# Patient Record
Sex: Female | Born: 1960 | Race: White | Hispanic: No | Marital: Married | Smoking: Never smoker
Health system: Southern US, Community
[De-identification: ages and names within clinical notes are randomized; demographics above are authoritative.]

## PROBLEM LIST (undated history)

## (undated) DIAGNOSIS — M109 Gout, unspecified: Secondary | ICD-10-CM

## (undated) DIAGNOSIS — D649 Anemia, unspecified: Secondary | ICD-10-CM

## (undated) DIAGNOSIS — C8590 Non-Hodgkin lymphoma, unspecified, unspecified site: Secondary | ICD-10-CM

## (undated) DIAGNOSIS — K760 Fatty (change of) liver, not elsewhere classified: Secondary | ICD-10-CM

## (undated) DIAGNOSIS — K219 Gastro-esophageal reflux disease without esophagitis: Secondary | ICD-10-CM

## (undated) DIAGNOSIS — I10 Essential (primary) hypertension: Secondary | ICD-10-CM

## (undated) DIAGNOSIS — G43909 Migraine, unspecified, not intractable, without status migrainosus: Secondary | ICD-10-CM

## (undated) DIAGNOSIS — E785 Hyperlipidemia, unspecified: Secondary | ICD-10-CM

## (undated) DIAGNOSIS — J189 Pneumonia, unspecified organism: Secondary | ICD-10-CM

## (undated) DIAGNOSIS — E119 Type 2 diabetes mellitus without complications: Secondary | ICD-10-CM

## (undated) DIAGNOSIS — F419 Anxiety disorder, unspecified: Secondary | ICD-10-CM

## (undated) HISTORY — DX: Anxiety disorder, unspecified: F41.9

## (undated) HISTORY — DX: Pneumonia, unspecified organism: J18.9

## (undated) HISTORY — DX: Hyperlipidemia, unspecified: E78.5

## (undated) HISTORY — DX: Anemia, unspecified: D64.9

## (undated) HISTORY — DX: Type 2 diabetes mellitus without complications: E11.9

## (undated) HISTORY — PX: TONSILLECTOMY: SUR1361

## (undated) HISTORY — DX: Gout, unspecified: M10.9

## (undated) HISTORY — PX: ABDOMINAL HYSTERECTOMY: SHX81

## (undated) HISTORY — DX: Migraine, unspecified, not intractable, without status migrainosus: G43.909

---

## 2020-04-06 ENCOUNTER — Emergency Department (HOSPITAL_COMMUNITY)
Admission: EM | Admit: 2020-04-06 | Discharge: 2020-04-06 | Disposition: A | Discharge status: Home / Self Care | Payer: 59 | Attending: Emergency Medicine | Admitting: Emergency Medicine

## 2020-04-06 ENCOUNTER — Other Ambulatory Visit: Payer: Self-pay

## 2020-04-06 ENCOUNTER — Encounter (HOSPITAL_COMMUNITY): Payer: Self-pay

## 2020-04-06 ENCOUNTER — Emergency Department (HOSPITAL_COMMUNITY): Payer: 59

## 2020-04-06 DIAGNOSIS — R079 Chest pain, unspecified: Secondary | ICD-10-CM | POA: Diagnosis not present

## 2020-04-06 DIAGNOSIS — R0602 Shortness of breath: Secondary | ICD-10-CM | POA: Diagnosis not present

## 2020-04-06 DIAGNOSIS — R110 Nausea: Secondary | ICD-10-CM | POA: Insufficient documentation

## 2020-04-06 DIAGNOSIS — I10 Essential (primary) hypertension: Secondary | ICD-10-CM | POA: Insufficient documentation

## 2020-04-06 DIAGNOSIS — Z91040 Latex allergy status: Secondary | ICD-10-CM | POA: Insufficient documentation

## 2020-04-06 HISTORY — DX: Fatty (change of) liver, not elsewhere classified: K76.0

## 2020-04-06 HISTORY — DX: Gastro-esophageal reflux disease without esophagitis: K21.9

## 2020-04-06 HISTORY — DX: Essential (primary) hypertension: I10

## 2020-04-06 LAB — CBC
HCT: 43.1 % (ref 36.0–46.0)
Hemoglobin: 13.7 g/dL (ref 12.0–15.0)
MCH: 27.7 pg (ref 26.0–34.0)
MCHC: 31.8 g/dL (ref 30.0–36.0)
MCV: 87.2 fL (ref 80.0–100.0)
Platelets: 269 10*3/uL (ref 150–400)
RBC: 4.94 MIL/uL (ref 3.87–5.11)
RDW: 13 % (ref 11.5–15.5)
WBC: 7.1 10*3/uL (ref 4.0–10.5)
nRBC: 0.3 % — ABNORMAL HIGH (ref 0.0–0.2)

## 2020-04-06 LAB — BASIC METABOLIC PANEL
Anion gap: 11 (ref 5–15)
BUN: 16 mg/dL (ref 6–20)
CO2: 22 mmol/L (ref 22–32)
Calcium: 9.5 mg/dL (ref 8.9–10.3)
Chloride: 108 mmol/L (ref 98–111)
Creatinine, Ser: 1.05 mg/dL — ABNORMAL HIGH (ref 0.44–1.00)
GFR calc Af Amer: >60 mL/min (ref >60)
GFR calc non Af Amer: 58 mL/min — ABNORMAL LOW (ref >60)
Glucose, Bld: 200 mg/dL — ABNORMAL HIGH (ref 70–99)
Potassium: 4.7 mmol/L (ref 3.5–5.1)
Sodium: 141 mmol/L (ref 135–145)

## 2020-04-06 LAB — D-DIMER, QUANTITATIVE: D-Dimer, Quant: <0.27 ug/mL-FEU (ref 0.00–0.50)

## 2020-04-06 LAB — TROPONIN I (HIGH SENSITIVITY)
Troponin I (High Sensitivity): 5 ng/L (ref <18)
Troponin I (High Sensitivity): 3 ng/L (ref <18)

## 2020-04-06 NOTE — ED Provider Notes (Signed)
[place] Provider Note   CSN: [id number] Arrival date & time: 04/06/20  0912     History Chief Complaint  Patient presents with  . Chest Pain  . Shortness of Breath    Debbie Hall is a 59 y.o. female with a past medical history significant for fatty liver, GERD, and hypertension who presents to the ED due to chest pain and shortness of breath.  Patient states she has been experiencing shortness of breath for the past year however it has worsened over the past few weeks.  Today she developed central, nonradiating chest pain that lasted roughly 1 hour.  She is currently chest pain-free.  She describes the chest pain as pressure-like.  Admits to having a family history of early CAD.  Admits to associated nausea, but denies vomiting.  Denies history of blood clots, recent surgeries, recent long immobilizations, and hormonal treatments.  Denies lower extremity edema.  Patient has been evaluated numerous times by her PCP for the shortness of breath who believes it is related to wearing a mask.  Patient states today she felt like her chest pain and shortness of breath were related to anxiety, so she took a Xanax with no improvement in symptoms.  Patient was evaluated in urgent care 2 weeks ago who advised her to go to the ED for further evaluation.  Patient states she went to the ED in [place] with all reassuring work-up.  Denies history of asthma.  Denies tobacco use.  Denies infectious symptoms of cough, fever, chills, and sore throat.  No treatment prior to arrival.  History obtained from patient and past medical records. No interpreter used during encounter.      Past Medical History:  Diagnosis Date  . Fatty liver   . GERD (gastroesophageal reflux disease)   . Hypertension     There are no problems to display for this patient.   Past Surgical History:  Procedure Laterality Date  . ABDOMINAL HYSTERECTOMY    . CESAREAN SECTION    .  TONSILLECTOMY       OB History   No obstetric history on file.     No family history on file.  Social History   Tobacco Use  . Smoking status: Not on file  Substance Use Topics  . Alcohol use: Not on file  . Drug use: Not on file    Home Medications Prior to Admission medications   Not on File    Allergies    Latex, Lisinopril, and Penicillins  Review of Systems   Review of Systems  Constitutional: Negative for chills and fever.  Respiratory: Positive for shortness of breath. Negative for cough.   Cardiovascular: Positive for chest pain. Negative for leg swelling.  Gastrointestinal: Positive for nausea. Negative for abdominal pain, diarrhea and vomiting.  All other systems reviewed and are negative.   Physical Exam Updated Vital Signs BP 131/85 (BP Location: Right Arm)   Pulse 77   Temp 98.2 F (36.8 C) (Oral)   Resp 17   Ht 5\' 5"  (1.651 m)   Wt 86.2 kg   SpO2 98%   BMI 31.62 kg/m   Physical Exam Vitals and nursing note reviewed.  Constitutional:      General: She is not in acute distress.    Appearance: She is not ill-appearing.  HENT:     Head: Normocephalic.  Eyes:     Pupils: Pupils are equal, round, and reactive to light.  Cardiovascular:  Rate and Rhythm: Normal rate and regular rhythm.     Pulses: Normal pulses.     Heart sounds: Normal heart sounds. No murmur heard.  No friction rub. No gallop.   Pulmonary:     Effort: Pulmonary effort is normal.     Breath sounds: Normal breath sounds.     Comments: Respirations equal and unlabored, patient able to speak in full sentences, lungs clear to auscultation bilaterally Abdominal:     General: Abdomen is flat. There is no distension.     Palpations: Abdomen is soft.     Tenderness: There is no abdominal tenderness. There is no guarding or rebound.  Musculoskeletal:     Cervical back: Neck supple.     Comments: No lower extremity edema.  Negative Homans' sign bilaterally.  No calf tenderness  bilaterally.  Skin:    General: Skin is warm and dry.  Neurological:     General: No focal deficit present.     Mental Status: She is alert.  Psychiatric:        Mood and Affect: Mood normal.        Behavior: Behavior normal.     ED Results / Procedures / Treatments   Labs (all labs ordered are listed, but only abnormal results are displayed) Labs Reviewed  BASIC METABOLIC PANEL - Abnormal; Notable for the following components:      Result Value   Glucose, Bld 200 (*)    Creatinine, Ser 1.05 (*)    GFR calc non Af Amer 58 (*)    All other components within normal limits  CBC - Abnormal; Notable for the following components:   nRBC 0.3 (*)    All other components within normal limits  D-DIMER, QUANTITATIVE (NOT AT [facility])  TROPONIN I (HIGH SENSITIVITY)  TROPONIN I (HIGH SENSITIVITY)    EKG None  Radiology DG Chest 2 View  Result Date: 04/06/2020 CLINICAL DATA:  Chest pain. EXAM: CHEST - 2 VIEW COMPARISON:  None. FINDINGS: The heart size and mediastinal contours are within normal limits. Both lungs are clear. No pneumothorax or pleural effusion is noted. The visualized skeletal structures are unremarkable. IMPRESSION: No active cardiopulmonary disease. Electronically Signed   By: Marijo Conception M.D.   On: 04/06/2020 10:15    Procedures Procedures (including critical care time)  Medications Ordered in ED Medications - No data to display  ED Course  I have reviewed the triage vital signs and the nursing notes.  Pertinent labs & imaging results that were available during my care of the patient were reviewed by me and considered in my medical decision making (see chart for details).  Clinical Course as of Apr 06 1618  Mon Apr 06, 2020  1614 D-Dimer, Quant: <0.27 [CA]    Clinical Course User Index [CA] Karie Kirks   MDM Rules/Calculators/A&P                         59 year old female presents to the ED due to chest pain and shortness of breath.   Shortness of breath has been ongoing for the past year however is worsened over the past few weeks.  Chest pain started today.  Denies history of blood clots, recent surgeries, recent long immobilizations, and hormonal treatments.  No history of asthma.  Upon arrival, patient is afebrile, not tachycardic or hypoxic.  Patient in no acute distress and non-ill-appearing.  Physical exam reassuring.  Lungs clear to auscultation bilaterally.  No  clinical signs of DVT on exam.  Routine labs and troponin ordered at triage.  Shared decision making in regards to obtaining a D-dimer and patient would like to move forward to assess for PE even though my suspicion is low.  Delta troponin flat.  Doubt ACS.  CBC reassuring with no leukocytosis and normal hemoglobin.  Doubt symptomatic anemia.  BMP reassuring with hyperglycemia at 200 with no anion gap.  Doubt DKA.  Mild elevation in creatinine at 1.05.  Chest x-ray personally reviewed which is negative for signs of pneumonia, pneumothorax, or widened mediastinum.  EKG personally reviewed which demonstrates normal sinus rhythm with no signs of acute ischemia.  D-dimer normal.  Doubt PE/DVT.  Presentation nonconcerning for dissection.  Advised patient to follow-up with PCP within the next week for further evaluation.  No hypoxia during patient's entire ED stay. Strict ED precautions discussed with patient. Patient states understanding and agrees to plan. Patient discharged home in no acute distress and stable vitals.  Final Clinical Impression(s) / ED Diagnoses Final diagnoses:  Nonspecific chest pain  Shortness of breath    Rx / DC Orders ED Discharge Orders    None       Karie Kirks 04/06/20 1620    Milton Ferguson, MD 04/07/20 [phone]

## 2020-04-06 NOTE — Discharge Instructions (Addendum)
As discussed, all your labs are reassuring today.  There are no signs of a blood clot on my exam or within your labs.  Please follow-up with your PCP within the next week for further evaluation.  Return to the ER for new or worsening symptoms.

## 2020-04-06 NOTE — ED Triage Notes (Signed)
Pt reports chest pain and SOB for the past few weeks. Pt seen at PCP and was told it was from wearing masks all the time. Pt states her pain got worse this morning while driving to work, thought it was related to her anxiety so she took a xanax but it did not help. Pt a.o

## 2020-09-07 ENCOUNTER — Encounter: Payer: Self-pay | Admitting: Gastroenterology

## 2020-09-07 ENCOUNTER — Ambulatory Visit (INDEPENDENT_AMBULATORY_CARE_PROVIDER_SITE_OTHER): Payer: PRIVATE HEALTH INSURANCE | Admitting: Gastroenterology

## 2020-09-07 ENCOUNTER — Other Ambulatory Visit (INDEPENDENT_AMBULATORY_CARE_PROVIDER_SITE_OTHER): Payer: PRIVATE HEALTH INSURANCE

## 2020-09-07 VITALS — BP 138/80 | HR 91 | Ht 65.0 in | Wt 193.0 lb

## 2020-09-07 DIAGNOSIS — R7989 Other specified abnormal findings of blood chemistry: Secondary | ICD-10-CM | POA: Diagnosis not present

## 2020-09-07 LAB — COMPREHENSIVE METABOLIC PANEL
ALT: 176 U/L — ABNORMAL HIGH (ref 0–35)
AST: 113 U/L — ABNORMAL HIGH (ref 0–37)
Albumin: 4.9 g/dL (ref 3.5–5.2)
Alkaline Phosphatase: 84 U/L (ref 39–117)
BUN: 18 mg/dL (ref 6–23)
CO2: 28 mEq/L (ref 19–32)
Calcium: 10.1 mg/dL (ref 8.4–10.5)
Chloride: 100 mEq/L (ref 96–112)
Creatinine, Ser: 0.77 mg/dL (ref 0.40–1.20)
GFR: 83.35 mL/min (ref >60.00)
Glucose, Bld: 122 mg/dL — ABNORMAL HIGH (ref 70–99)
Potassium: 3.6 mEq/L (ref 3.5–5.1)
Sodium: 139 mEq/L (ref 135–145)
Total Bilirubin: 0.7 mg/dL (ref 0.2–1.2)
Total Protein: 8.1 g/dL (ref 6.0–8.3)

## 2020-09-07 LAB — IBC + FERRITIN
Ferritin: 252.9 ng/mL (ref 10.0–291.0)
Iron: 99 ug/dL (ref 42–145)
Saturation Ratios: 25.6 % (ref 20.0–50.0)
Transferrin: 276 mg/dL (ref 212.0–360.0)

## 2020-09-07 LAB — CBC
HCT: 42.3 % (ref 36.0–46.0)
Hemoglobin: 14.2 g/dL (ref 12.0–15.0)
MCHC: 33.6 g/dL (ref 30.0–36.0)
MCV: 82.4 fl (ref 78.0–100.0)
Platelets: 277 10*3/uL (ref 150.0–400.0)
RBC: 5.13 Mil/uL — ABNORMAL HIGH (ref 3.87–5.11)
RDW: 13.3 % (ref 11.5–15.5)
WBC: 8 10*3/uL (ref 4.0–10.5)

## 2020-09-07 NOTE — Patient Instructions (Addendum)
If you are age 61 or younger, your body mass index should be between 19-25. Your Body mass index is 32.12 kg/m. If this is out of the aformentioned range listed, please consider follow up with your Primary Care Provider.   Your provider has requested that you go to the basement level for lab work before leaving today. Press "B" on the elevator. The lab is located at the first door on the left as you exit the elevator.  You have been scheduled for an abdominal ultrasound at [facility] (1st floor of hospital) on 09-21-20 at 9:00am. Please arrive 30 minutes prior to your appointment for registration. Make certain not to have anything to eat or drink after midnight the night prior to your appointment. Should you need to reschedule your appointment, please contact radiology at [phone]. This test typically takes about 30 minutes to perform.  Due to recent changes in healthcare laws, you may see the results of your imaging and laboratory studies on MyChart before your provider has had a chance to review them.  We understand that in some cases there may be results that are confusing or concerning to you. Not all laboratory results come back in the same time frame and the provider may be waiting for multiple results in order to interpret others.  Please give [place] 48 hours in order for your provider to thoroughly review all the results before contacting the office for clarification of your results.   Thank you for entrusting me with your care and choosing [facility].  Dr Ardis Hughs

## 2020-09-07 NOTE — Progress Notes (Signed)
HPI: This is a very pleasant 59 year old woman who was referred to me by Tempie Hoist, FNP  to evaluate elevated liver tests.    She has known about elevated liver tests for at least the past 4 years.  She says her transaminases have been as high as the 500s, they are usually in the 100s.  Most recently her primary care physician checked them.  See below.  She tells me she has seen 3 gastroenterologist in the past 4 years and has always been told that she has fatty liver and that there is nothing that can be done for this besides her losing weight.  She has had 4 ultrasounds 2 CAT scans and one upper endoscopy.  She has never had a liver biopsy.  At 1 point she was told that her ferritin was elevated.  She believes one of her brothers has hemochromatosis.  She has gained 20 pounds in the last year or so.  She has constant discomfort in her right upper quadrant which is intermittently worse especially when she eats certain fatty containing foods.   Old Data Reviewed: At the time of her visit today I was able to review an office note from [place] primary care team from July 02, 2020.  She was having some abdominal pain in her right upper quadrant.  There is a note in the HPI section which reads "she has been told she had fatty liver and is seen 3 gastroenterologist with no other findings.  She has not had an abdominal ultrasound or CT in a year since the hard area formed".  Blood work from that week in early October showed normal hemoglobin.  Complete metabolic profile was normal except for AST 310, ALT 346.     Review of systems: Pertinent positive and negative review of systems were noted in the above HPI section. All other review negative.   Past Medical History:  Diagnosis Date  . Anemia   . Anxiety   . Fatty liver   . GERD (gastroesophageal reflux disease)   . Gout   . Hyperlipidemia   . Hypertension   . Migraines   . Pneumonia    as a child    Past  Surgical History:  Procedure Laterality Date  . ABDOMINAL HYSTERECTOMY     partial  . CESAREAN SECTION     x 2  . TONSILLECTOMY      Current Outpatient Medications  Medication Sig Dispense Refill  . ALPRAZolam (XANAX) 0.5 MG tablet Take 0.5 mg by mouth 2 (two) times daily as needed.    . Biotin 10 MG TABS Take 1 tablet by mouth daily.    Marland Kitchen buPROPion (WELLBUTRIN XL) 150 MG 24 hr tablet  See Instructions, TAKE 1 TABLET BY MOUTH EVERY DAY, # 90 tab, 1 Refill(s), Pharmacy: [id number]    . hydrochlorothiazide (HYDRODIURIL) 25 MG tablet Take 12.5 mg by mouth daily.    . metoprolol tartrate (LOPRESSOR) 50 MG tablet Take 0.5 tablets by mouth in the morning and at bedtime.    . ondansetron (ZOFRAN) 4 MG tablet Take 4 mg by mouth every 8 (eight) hours as needed for nausea or vomiting.    . pantoprazole (PROTONIX) 40 MG tablet Take 40 mg by mouth daily.     No current facility-administered medications for this visit.    Allergies as of 09/07/2020 - Review Complete 09/07/2020  Allergen Reaction Noted  . Avocado Itching 05/15/2018  . Banana Itching 05/15/2018  .  Latex Itching 05/15/2018  . Lisinopril  04/06/2020  . Penicillin g  09/04/2020  . Penicillins  04/06/2020    Family History  Problem Relation Age of Onset  . Hypertension Mother   . Heart disease Mother   . Heart attack Mother   . Pancreatic cancer Mother   . Prostate cancer Father   . Colon cancer Neg Hx   . Esophageal cancer Neg Hx   . Rectal cancer Neg Hx     Social History   Socioeconomic History  . Marital status: Married    Spouse name: Not on file  . Number of children: 4  . Years of education: Not on file  . Highest education level: Not on file  Occupational History  . 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    Comment: [employer]  Tobacco Use  . Smoking status: Never Smoker  . Smokeless tobacco: Never Used  Vaping Use  . Vaping Use: Never used  Substance and Sexual Activity  . Alcohol  use: Yes    Comment: occasional  . Drug use: Never  . Sexual activity: Not on file  Other Topics Concern  . Not on file  Social History Narrative  . Not on file   Social Determinants of Health   Financial Resource Strain: Not on file  Food Insecurity: Not on file  Transportation Needs: Not on file  Physical Activity: Not on file  Stress: Not on file  Social Connections: Not on file  Intimate Partner Violence: Not on file     Physical Exam: BP 138/80   Pulse 91   Ht 5\' 5"  (1.651 m)   Wt 193 lb (87.5 kg)   BMI 32.12 kg/m  Constitutional: generally well-appearing Psychiatric: alert and oriented x3 Eyes: extraocular movements intact Mouth: oral pharynx moist, no lesions Neck: supple no lymphadenopathy Cardiovascular: heart regular rate and rhythm Lungs: clear to auscultation bilaterally Abdomen: soft, nontender, nondistended, no obvious ascites, no peritoneal signs, normal bowel sounds Extremities: no lower extremity edema bilaterally Skin: no lesions on visible extremities   Assessment and plan: 59 y.o. female with chronically elevated liver tests, family history of hemochromatosis  Her brother has hemochromatosis.  She was told that her ferritin was elevated at some point in the past 2 years.  She has never been told that she might also have iron overload or hemochromatosis.  I suspect that however may be the case.  I recommended we proceed with blood tests and imaging studies as usual work-up for elevated liver tests.  In addition I am ordering some hemochromatosis genetic testing given her family history.  She understands that she might need liver biopsy pending the results of all the above tests.   Please see the "Patient Instructions" section for addition details about the plan.   Owens Loffler, MD [facility] 09/07/2020, 1:41 PM  Cc: Tempie Hoist, FNP  Total time on date of encounter was 45 minutes (this included time spent preparing to see  the patient reviewing records; obtaining and/or reviewing separately obtained history; performing a medically appropriate exam and/or evaluation; counseling and educating the patient and family if present; ordering medications, tests or procedures if applicable; and documenting clinical information in the health record).

## 2020-09-07 NOTE — Addendum Note (Signed)
Addended by: Adah Salvage F on: 09/07/2020 02:39 PM   Modules accepted: Orders

## 2020-09-07 NOTE — Addendum Note (Signed)
Addended by: Adah Salvage F on: 09/07/2020 02:40 PM   Modules accepted: Orders

## 2020-09-10 LAB — PROTIME-INR
INR: 1 ratio (ref 0.8–1.0)
Prothrombin Time: 11.7 s (ref 9.6–13.1)

## 2020-09-11 LAB — ANTI-SMOOTH MUSCLE ANTIBODY, IGG: Actin (Smooth Muscle) Antibody (IGG): <20 U (ref <20)

## 2020-09-11 LAB — MITOCHONDRIAL ANTIBODIES: Mitochondrial M2 Ab, IgG: <=20 U

## 2020-09-11 LAB — HEPATITIS C ANTIBODY
Hepatitis C Ab: NONREACTIVE
SIGNAL TO CUT-OFF: 0.02 (ref <1.00)

## 2020-09-11 LAB — ANTI-NUCLEAR AB-TITER (ANA TITER): ANA Titer 1: 1:80 {titer} — ABNORMAL HIGH

## 2020-09-11 LAB — IRON, TOTAL/TOTAL IRON BINDING CAP
%SAT: 25 % (calc) (ref 16–45)
Iron: 94 ug/dL (ref 45–160)
TIBC: 379 mcg/dL (calc) (ref 250–450)

## 2020-09-11 LAB — IGA: Immunoglobulin A: 130 mg/dL (ref 70–320)

## 2020-09-11 LAB — CERULOPLASMIN: Ceruloplasmin: 34 mg/dL (ref 18–53)

## 2020-09-11 LAB — ANA: Anti Nuclear Antibody (ANA): POSITIVE — AB

## 2020-09-11 LAB — HEPATITIS B SURFACE ANTIGEN: Hepatitis B Surface Ag: NONREACTIVE

## 2020-09-11 LAB — ALPHA-1-ANTITRYPSIN: A-1 Antitrypsin, Ser: 158 mg/dL (ref 83–199)

## 2020-09-11 LAB — TISSUE TRANSGLUTAMINASE, IGA: (tTG) Ab, IgA: <1 U/mL

## 2020-09-11 LAB — HEPATITIS A ANTIBODY, TOTAL: Hepatitis A AB,Total: NONREACTIVE

## 2020-09-11 LAB — HEPATITIS B SURFACE ANTIBODY,QUALITATIVE: Hep B S Ab: REACTIVE — AB

## 2020-09-16 LAB — HEMOCHROMATOSIS DNA-PCR(C282Y,H63D)

## 2020-09-21 ENCOUNTER — Ambulatory Visit (HOSPITAL_COMMUNITY)
Admission: RE | Admit: 2020-09-21 | Discharge: 2020-09-21 | Disposition: A | Discharge status: Home / Self Care | Payer: PRIVATE HEALTH INSURANCE | Source: Ambulatory Visit | Attending: Gastroenterology | Admitting: Gastroenterology

## 2020-09-21 ENCOUNTER — Other Ambulatory Visit: Payer: Self-pay

## 2020-09-21 DIAGNOSIS — R7989 Other specified abnormal findings of blood chemistry: Secondary | ICD-10-CM | POA: Diagnosis not present

## 2020-09-22 ENCOUNTER — Other Ambulatory Visit: Payer: Self-pay

## 2020-09-22 DIAGNOSIS — R7989 Other specified abnormal findings of blood chemistry: Secondary | ICD-10-CM

## 2020-09-29 ENCOUNTER — Telehealth: Payer: Self-pay | Admitting: Gastroenterology

## 2020-09-29 NOTE — Telephone Encounter (Signed)
The pt has questions regarding the actual biopsy.  I have given her the number to [facility] ultrasound so that she can have her questions answered. The pt has been advised of the information and verbalized understanding.

## 2020-09-29 NOTE — Telephone Encounter (Signed)
Inbound call from patient requesting a call back in regards to biopsy she has scheduled on 10/05/20 please.

## 2020-10-05 ENCOUNTER — Ambulatory Visit (HOSPITAL_COMMUNITY): Admission: RE | Admit: 2020-10-05 | Payer: No Typology Code available for payment source | Source: Ambulatory Visit

## 2020-10-06 ENCOUNTER — Other Ambulatory Visit: Payer: Self-pay | Admitting: Student

## 2020-10-07 ENCOUNTER — Ambulatory Visit (HOSPITAL_COMMUNITY)
Admission: RE | Admit: 2020-10-07 | Discharge: 2020-10-07 | Disposition: A | Discharge status: Home / Self Care | Payer: PRIVATE HEALTH INSURANCE | Source: Ambulatory Visit | Attending: Gastroenterology | Admitting: Gastroenterology

## 2020-10-07 ENCOUNTER — Other Ambulatory Visit: Payer: Self-pay

## 2020-10-07 ENCOUNTER — Other Ambulatory Visit: Payer: Self-pay | Admitting: Physician Assistant

## 2020-10-07 DIAGNOSIS — R110 Nausea: Secondary | ICD-10-CM | POA: Insufficient documentation

## 2020-10-07 DIAGNOSIS — Z79899 Other long term (current) drug therapy: Secondary | ICD-10-CM | POA: Diagnosis not present

## 2020-10-07 DIAGNOSIS — E785 Hyperlipidemia, unspecified: Secondary | ICD-10-CM | POA: Insufficient documentation

## 2020-10-07 DIAGNOSIS — K219 Gastro-esophageal reflux disease without esophagitis: Secondary | ICD-10-CM | POA: Diagnosis not present

## 2020-10-07 DIAGNOSIS — F419 Anxiety disorder, unspecified: Secondary | ICD-10-CM | POA: Diagnosis not present

## 2020-10-07 DIAGNOSIS — R7989 Other specified abnormal findings of blood chemistry: Secondary | ICD-10-CM

## 2020-10-07 DIAGNOSIS — R1011 Right upper quadrant pain: Secondary | ICD-10-CM | POA: Diagnosis not present

## 2020-10-07 DIAGNOSIS — Z7901 Long term (current) use of anticoagulants: Secondary | ICD-10-CM | POA: Diagnosis not present

## 2020-10-07 DIAGNOSIS — K7581 Nonalcoholic steatohepatitis (NASH): Secondary | ICD-10-CM | POA: Diagnosis not present

## 2020-10-07 DIAGNOSIS — I10 Essential (primary) hypertension: Secondary | ICD-10-CM | POA: Diagnosis not present

## 2020-10-07 LAB — CBC
HCT: 43 % (ref 36.0–46.0)
Hemoglobin: 13.8 g/dL (ref 12.0–15.0)
MCH: 27.3 pg (ref 26.0–34.0)
MCHC: 32.1 g/dL (ref 30.0–36.0)
MCV: 85.1 fL (ref 80.0–100.0)
Platelets: 261 10*3/uL (ref 150–400)
RBC: 5.05 MIL/uL (ref 3.87–5.11)
RDW: 13.2 % (ref 11.5–15.5)
WBC: 6.6 10*3/uL (ref 4.0–10.5)
nRBC: 0 % (ref 0.0–0.2)

## 2020-10-07 LAB — PROTIME-INR
INR: 1 (ref 0.8–1.2)
Prothrombin Time: 12.7 seconds (ref 11.4–15.2)

## 2020-10-07 MED ORDER — SODIUM CHLORIDE 0.9 % IV SOLN
INTRAVENOUS | Status: DC
  Administered 2020-10-07: 10 mL/h via INTRAVENOUS

## 2020-10-07 MED ORDER — MIDAZOLAM HCL 2 MG/2ML IJ SOLN
INTRAMUSCULAR | Status: AC | PRN
Start: 2020-10-07 — End: 2020-10-07
  Administered 2020-10-07: 1 mg via INTRAVENOUS
  Administered 2020-10-07: 0.5 mg via INTRAVENOUS

## 2020-10-07 MED ORDER — GELATIN ABSORBABLE 12-7 MM EX MISC
CUTANEOUS | Status: AC
  Filled 2020-10-07: qty 1

## 2020-10-07 MED ORDER — MIDAZOLAM HCL 2 MG/2ML IJ SOLN
INTRAMUSCULAR | Status: AC
  Filled 2020-10-07: qty 2

## 2020-10-07 MED ORDER — FENTANYL CITRATE (PF) 100 MCG/2ML IJ SOLN
INTRAMUSCULAR | Status: AC | PRN
  Administered 2020-10-07: 50 ug via INTRAVENOUS
  Administered 2020-10-07: 25 ug via INTRAVENOUS

## 2020-10-07 MED ORDER — ONDANSETRON HCL 4 MG PO TABS
4.0000 mg | ORAL_TABLET | Freq: Once | ORAL | Status: AC
  Administered 2020-10-07: 4 mg via ORAL
  Filled 2020-10-07: qty 1

## 2020-10-07 MED ORDER — LIDOCAINE HCL (PF) 1 % IJ SOLN
INTRAMUSCULAR | Status: AC
  Filled 2020-10-07: qty 30

## 2020-10-07 MED ORDER — FENTANYL CITRATE (PF) 100 MCG/2ML IJ SOLN
INTRAMUSCULAR | Status: AC
  Filled 2020-10-07: qty 2

## 2020-10-07 NOTE — Discharge Instructions (Signed)
Liver Biopsy, Care After These instructions give you information on caring for yourself after your procedure. Your doctor may also give you more specific instructions. Call your doctor if you have any problems or questions after your procedure. What can I expect after the procedure? After the procedure, it is common to have:  Pain and soreness where the biopsy was done.  Bruising around the area where the biopsy was done.  Sleepiness and be tired for a few days. Follow these instructions at home: Medicines  Take over-the-counter and prescription medicines only as told by your doctor.  If you were prescribed an antibiotic medicine, take it as told by your doctor. Do not stop taking the antibiotic even if you start to feel better.  Do not take medicines such as aspirin and ibuprofen. These medicines can thin your blood. Do not take these medicines unless your doctor tells you to take them.  If you are taking prescription pain medicine, take actions to prevent or treat constipation. Your doctor may recommend that you: ? Drink enough fluid to keep your pee (urine) clear or pale yellow. ? Take over-the-counter or prescription medicines. ? Eat foods that are high in fiber, such as fresh fruits and vegetables, whole grains, and beans. ? Limit foods that are high in fat and processed sugars, such as fried and sweet foods. Caring for your cut  Follow instructions from your doctor about how to take care of your cuts from surgery (incisions). Make sure you: ? Wash your hands with soap and water before you change your bandage (dressing). If you cannot use soap and water, use hand sanitizer. ? Change your bandage as told by your doctor. ? Leave stitches (sutures), skin glue, or skin tape (adhesive) strips in place. They may need to stay in place for 2 weeks or longer. If tape strips get loose and curl up, you may trim the loose edges. Do not remove tape strips completely unless your doctor says it is  okay.  Check your cuts every day for signs of infection. Check for: ? Redness, swelling, or more pain. ? Fluid or blood. ? Pus or a bad smell. ? Warmth.  Do not take baths, swim, or use a hot tub until your doctor says it is okay to do so. Activity  Rest at home for 1-2 days or as told by your doctor. ? Avoid sitting for a long time without moving. Get up to take short walks every 1-2 hours.  Return to your normal activities as told by your doctor. Ask what activities are safe for you.  Do not do these things in the first 24 hours: ? Drive. ? Use machinery. ? Take a bath or shower.  Do not lift more than 10 pounds (4.5 kg) or play contact sports for the first 2 weeks.   General instructions  Do not drink alcohol in the first week after the procedure.  Have someone stay with you for at least 24 hours after the procedure.  Get your test results. Ask your doctor or the department that is doing the test: ? When will my results be ready? ? How will I get my results? ? What are my treatment options? ? What other tests do I need? ? What are my next steps?  Keep all follow-up visits as told by your doctor. This is important.   Contact a doctor if:  A cut bleeds and leaves more than just a small spot of blood.  A cut is red,   puffs up (swells), or hurts more than before.  Fluid or something else comes from a cut.  A cut smells bad.  You have a fever or chills. Get help right away if:  You have swelling, bloating, or pain in your belly (abdomen).  You get dizzy or faint.  You have a rash.  You feel sick to your stomach (nauseous) or throw up (vomit).  You have trouble breathing, feel short of breath, or feel faint.  Your chest hurts.  You have problems talking or seeing.  You have trouble with your balance or moving your arms or legs. Summary  After the procedure, it is common to have pain, soreness, bruising, and tiredness.  Your doctor will tell you how to  take care of yourself at home. Change your bandage, take your medicines, and limit your activities as told by your doctor.  Call your doctor if you have symptoms of infection. Get help right away if your belly swells, your cut bleeds a lot, or you have trouble talking or breathing. This information is not intended to replace advice given to you by your health care provider. Make sure you discuss any questions you have with your health care provider. Document Revised: 09/21/2017 Document Reviewed: 09/22/2017 Elsevier Patient Education  2021 [employer]  

## 2020-10-07 NOTE — Sedation Documentation (Signed)
Attempted to call report to Short Stay. Per Clarise Cruz, unable to take report at this time, and will have receiving nurse call back.

## 2020-10-07 NOTE — Procedures (Signed)
Interventional Radiology Procedure Note  Procedure: Ultrasound guided non-focal liver biopsy  Findings: Please refer to procedural dictation for full description. 18 ga core x 2, placed in formalin and sent to Pathology.  Gelfoam slurry needle track embolization.  Complications: None immediate  Estimated Blood Loss: < 5 mL  Recommendations: Bedrest 3 hours. Follow up Pathology results.   Ruthann Cancer, MD

## 2020-10-07 NOTE — H&P (Signed)
Chief Complaint: Patient was seen in consultation today for a random liver biopsy.  Referring Physician(s): Milus Banister  Supervising Physician: Ruthann Cancer  Patient Status: [facility] - Out-pt  History of Present Illness: Debbie Hall is a 60 y.o. female with a past medical history significant for anxiety, gout, HTN, HLD, anemia, GERD and fatty liver disease who presents today for a random liver biopsy. Debbie Hall was referred to GI by her PCP for further evaluation of elevated liver function tests and RUQ abdominal pain - she was seen by Dr. Ardis Hughs on 09/07/20 and reported having known about these lab abnormalities for several years and that she was told she had fatty liver disease by previous GI physicians. She also reported previously elevated ferritin and a family history of hemochromatosis. She reported multiple previous US, CT scans and one EGD but she denied previous liver biopsy. She underwent further lab testing after that visit which noted slightly elevated ANA concerning for possible autoimmune hepatitis. An US of the liver was performed on 09/21/20 which showed diffuse hepatic steatosis concerning for possible fatty liver disease. She has now been referred to IR for a random liver biopsy to further evaluate these findings.  Debbie Hall reports ongoing RUQ pain which is constant and associated with frequent nausea for which she takes Zofran regularly with little relief. Eating things like greasy food and peanut butter worsens her symptoms, nothing really makes the pain any better that she has tried. She is frustrated that she has had so many tests over the years and was just told that she had fatty liver disease with no solution for the pain/nausea. She is nervous about the procedure today but is ready to proceed in hopes of finding an answer to her symptoms.  Past Medical History:  Diagnosis Date  . Anemia   . Anxiety   . Fatty liver   . GERD (gastroesophageal reflux disease)    . Gout   . Hyperlipidemia   . Hypertension   . Migraines   . Pneumonia    as a child    Past Surgical History:  Procedure Laterality Date  . ABDOMINAL HYSTERECTOMY     partial  . CESAREAN SECTION     x 2  . TONSILLECTOMY      Allergies: Avocado, Banana, Latex, Lisinopril, and Penicillins  Medications: Prior to Admission medications   Medication Sig Start Date End Date Taking? Authorizing Provider  ALPRAZolam Duanne Moron) 0.5 MG tablet Take 0.5 mg by mouth 2 (two) times daily as needed for anxiety. 03/18/20  Yes [provider]  buPROPion (WELLBUTRIN XL) 150 MG 24 hr tablet Take 150 mg by mouth daily. 01/16/20  Yes [provider]  hydrochlorothiazide (HYDRODIURIL) 25 MG tablet Take 12.5 mg by mouth daily. 02/19/20  Yes [provider]  ibuprofen (ADVIL) 200 MG tablet Take 600 mg by mouth every 6 (six) hours as needed for moderate pain.   Yes [provider]  metoprolol tartrate (LOPRESSOR) 50 MG tablet Take 25 mg by mouth in the morning and at bedtime. 09/02/19  Yes [provider]  ondansetron (ZOFRAN) 4 MG tablet Take 4 mg by mouth every 8 (eight) hours as needed for nausea or vomiting.   Yes [provider]  pantoprazole (PROTONIX) 40 MG tablet Take 40 mg by mouth daily. 05/09/19  Yes [provider]     Family History  Problem Relation Age of Onset  . Hypertension Mother   . Heart disease Mother   . Heart  attack Mother   . Pancreatic cancer Mother   . Prostate cancer Father   . Colon cancer Neg Hx   . Esophageal cancer Neg Hx   . Rectal cancer Neg Hx     Social History   Socioeconomic History  . Marital status: Married    Spouse name: Not on file  . Number of children: 4  . Years of education: Not on file  . Highest education level: Not on file  Occupational History  . 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    Comment: [employer]  Tobacco Use  . Smoking status: Never Smoker  . Smokeless  tobacco: Never Used  Vaping Use  . Vaping Use: Never used  Substance and Sexual Activity  . Alcohol use: Yes    Comment: occasional  . Drug use: Never  . Sexual activity: Not on file  Other Topics Concern  . Not on file  Social History Narrative  . Not on file   Social Determinants of Health   Financial Resource Strain: Not on file  Food Insecurity: Not on file  Transportation Needs: Not on file  Physical Activity: Not on file  Stress: Not on file  Social Connections: Not on file     Review of Systems: A 12 point ROS discussed and pertinent positives are indicated in the HPI above.  All other systems are negative.  Review of Systems  Constitutional: Negative for chills and fever.  Respiratory: Negative for cough and shortness of breath.   Cardiovascular: Negative for chest pain.  Gastrointestinal: Positive for abdominal pain (RUQ) and nausea. Negative for blood in stool, constipation, diarrhea and vomiting.  Genitourinary: Negative for dysuria and hematuria.  Musculoskeletal: Negative for back pain.  Skin: Negative for color change.  Neurological: Negative for dizziness and headaches.    Vital Signs: BP (!) 153/70   Pulse 81   Temp 98.2 F (36.8 C) (Oral)   Ht 5\' 5"  (1.651 m)   Wt 189 lb (85.7 kg)   SpO2 97%   BMI 31.45 kg/m   Physical Exam Vitals reviewed.  Constitutional:      General: She is not in acute distress. HENT:     Head: Normocephalic.     Mouth/Throat:     Mouth: Mucous membranes are moist.     Pharynx: Oropharynx is clear. No oropharyngeal exudate or posterior oropharyngeal erythema.  Eyes:     General: No scleral icterus. Cardiovascular:     Rate and Rhythm: Normal rate and regular rhythm.  Pulmonary:     Effort: Pulmonary effort is normal.     Breath sounds: Normal breath sounds.  Abdominal:     General: There is no distension.     Palpations: Abdomen is soft.     Tenderness: There is no abdominal tenderness.  Skin:    General: Skin  is warm and dry.     Coloration: Skin is not jaundiced.  Neurological:     Mental Status: She is alert and oriented to person, place, and time.  Psychiatric:        Mood and Affect: Mood normal.        Thought Content: Thought content normal.        Judgment: Judgment normal.      MD Evaluation Airway: WNL Heart: WNL Abdomen: WNL Chest/ Lungs: WNL ASA  Classification: 2 Mallampati/Airway Score: Two   Imaging: US Abdomen Limited RUQ (LIVER/GB)  Result Date: 09/21/2020 CLINICAL DATA:  Elevated liver function tests. EXAM: ULTRASOUND ABDOMEN LIMITED  RIGHT UPPER QUADRANT COMPARISON:  None. FINDINGS: Gallbladder: No gallstones or wall thickening visualized. No sonographic Murphy sign noted by sonographer. Common bile duct: Diameter: 3 mm, within normal limits. Liver: Diffusely increased echogenicity of the hepatic parenchyma, consistent with hepatic steatosis. No hepatic mass identified. Portal vein is patent on color Doppler imaging with normal direction of blood flow towards the liver. Other: None. IMPRESSION: No evidence of cholelithiasis or biliary ductal dilatation. Diffuse hepatic steatosis. Electronically Signed   By: Marlaine Hind M.D.   On: 09/21/2020 09:37    Labs:  CBC: Recent Labs    04/06/20 0927 09/07/20 1443 10/07/20 0613  WBC 7.1 8.0 6.6  HGB 13.7 14.2 13.8  HCT 43.1 42.3 43.0  PLT 269 277.0 261    COAGS: Recent Labs    09/07/20 1443 10/07/20 0613  INR 1.0 1.0    BMP: Recent Labs    04/06/20 0927 09/07/20 1443  NA 141 139  K 4.7 3.6  CL 108 100  CO2 22 28  GLUCOSE 200* 122*  BUN 16 18  CALCIUM 9.5 10.1  CREATININE 1.05* 0.77  GFRNONAA 58*  --   GFRAA >60  --     LIVER FUNCTION TESTS: Recent Labs    09/07/20 1443  BILITOT 0.7  AST 113*  ALT 176*  ALKPHOS 84  PROT 8.1  ALBUMIN 4.9    TUMOR MARKERS: No results for input(s): AFPTM, CEA, CA199, CHROMGRNA in the last 8760 hours.  Assessment and Plan:  60 y/o F with history of  elevated LFTs, slightly elevated ANA and possible fatty liver disease who presents today for a random liver biopsy for further evaluation of these findings.  Patient has been NPO since 7:30 pm yesterday, no current anticoagulation/antiplatelet medications. Afebrile, WBC 6.6, hgb 13.8, plt 261, INR 1.0.  Risks and benefits of random liver biopsy was discussed with the patient and/or patient's family including, but not limited to bleeding, infection, damage to adjacent structures or low yield requiring additional tests.  All of the questions were answered and there is agreement to proceed.  Consent signed and in chart.  Thank you for this interesting consult.  I greatly enjoyed meeting Debbie Hall and look forward to participating in their care.  A copy of this report was sent to the requesting provider on this date.  Electronically Signed: Joaquim Nam, PA-C 10/07/2020, 7:32 AM   I spent a total of 30 Minutes in face to face in clinical consultation, greater than 50% of which was counseling/coordinating care for random liver biopsy.

## 2020-10-08 LAB — SURGICAL PATHOLOGY

## 2020-10-09 ENCOUNTER — Ambulatory Visit (HOSPITAL_COMMUNITY): Payer: No Typology Code available for payment source

## 2020-10-19 ENCOUNTER — Other Ambulatory Visit: Payer: Self-pay

## 2020-10-19 ENCOUNTER — Telehealth: Payer: Self-pay | Admitting: Gastroenterology

## 2020-10-19 DIAGNOSIS — E669 Obesity, unspecified: Secondary | ICD-10-CM

## 2020-10-19 DIAGNOSIS — R7989 Other specified abnormal findings of blood chemistry: Secondary | ICD-10-CM

## 2020-10-19 DIAGNOSIS — K760 Fatty (change of) liver, not elsewhere classified: Secondary | ICD-10-CM

## 2020-10-19 NOTE — Telephone Encounter (Signed)
We discussed her liver biopsy results, fatty liver disease.  She knows to start taking vitamin E 800 international units once daily and to resume her omega-3 fatty acids.  She is trying to get back in to see her cardiologist from 6 or 7 years ago for a general heart checkup.  She understands that I would like her to try to lose between 5 and 7% of her weight.    Never drinks too much but will have a glass of wine with some dinners.  She is going to try to strictly avoid alcohol in the future.    Patty, can you please refer her to a dietitian to discuss fatty liver disease, weight loss strategies to lose between 5 and 7% of her weight.  Also return office visit with me in 3 or 4 months with LFTs a few days prior.  Thank you

## 2020-10-19 NOTE — Telephone Encounter (Signed)
ROV recall entered and lab order entered.  Nutrition and diabetes referral placed.

## 2020-11-05 ENCOUNTER — Ambulatory Visit: Payer: No Typology Code available for payment source | Admitting: Dietician

## 2020-11-24 ENCOUNTER — Other Ambulatory Visit: Payer: Self-pay

## 2020-11-24 ENCOUNTER — Encounter: Payer: No Typology Code available for payment source | Attending: Gastroenterology | Admitting: Dietician

## 2020-11-24 ENCOUNTER — Encounter: Payer: Self-pay | Admitting: Dietician

## 2020-11-24 VITALS — Ht 65.0 in | Wt 190.4 lb

## 2020-11-24 DIAGNOSIS — K760 Fatty (change of) liver, not elsewhere classified: Secondary | ICD-10-CM | POA: Insufficient documentation

## 2020-11-24 NOTE — Progress Notes (Signed)
Medical Nutrition Therapy  Appointment Start time:  1615  Appointment End time:  1720  Primary concerns today: Weight Loss  Referral diagnosis: R79.89 Elevated LFTs, K76.0 Fatty Liver, E66.9 Obesity Preferred learning style: No preference indicated) Learning readiness: Not ready   NUTRITION ASSESSMENT   Anthropometrics  Ht: 5'5" Wt:190.4 lbs Wt History: 165 lbs 1 year ago (self reported)          135 lbs UBW 4+ yrs ago (self reported)   Clinical Medical Hx: GERD, HTN, RUQ tenderness Medications: Hydrochlorothiazide, Protonix, Metropolol Tartrate, Zofran Labs:  ALT- 176 (Very High) AST- 113 (Very High) TC - 316 TGL - 391 LDL-205 Glucose - 122 Notable Signs/Symptoms: Reflux, Lack of appetite  Lifestyle & Dietary Hx Pt is frustrated about weight gain, low motivation. Pt reports metabolism is non existent, pt states they weigh more than they ever have in their life. Pt states they eat well but their weight has gone up and up and up.  Pt reports their daughter cooks dinner daily and tries to make healthy meals.  Pt reports daughter has alpha-gal. Pt reports rarely snacking and usually has two meals a day. Pt snacks on cottage cheese or jello. Pt states any high fat or greasy foods gives them bad heartburn. Pt reports getting full very quickly. 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 Pt reports circumstances at work are very stressful. Pt states staffing and supply shortages are making their job difficult. 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 Pt reports considering going back to this job to be able to have more free time to do what they want. Pt reports sometimes at work it gets so busy that they won't eat until 5:00 PM. Pt states it is unintentional and is due to being focused on work. Pt reports not feeling hungry during the day. Pt reports being formerly very physically active. Walked  5 miles a day, punching bag, walked at lunch. Pt reports having a lot of exercise equipment at home that is going unused. Pt reports not having any "me" time, states they have to give a lot of themselves to work and family.  Pt reports having to care for husband on disability.    Estimated daily fluid intake: ~50 oz Supplements: Fish Oil, Vit E Sleep: wakes up in the middle of the night Stress / self-care: High stress with work 8/10 Current average weekly physical activity: ADLs, used to walk a lot  24-Hr Dietary Recall First Meal: 1 eggo waffle  Snack: none Second Meal: [place] breast, mashed potatoes, green beans (leftovers) Snack: none Third Meal: Chicken meatballs, pasta, low-fat alfredo Snack: none Beverages: Water, Powerade   NUTRITION DIAGNOSIS  NB-1.1 Food and nutrition-related knowledge deficit As related to fatty liver.  As evidenced by ALT/AST values of 176/113, unintentional fasting during work day, and lack of physical activity.Marland Kitchen   NUTRITION INTERVENTION  Nutrition education (E-1) on the following topics:  Educated patient on the balanced plate eating model. Recommended lunch and dinner be 1/2 non-starchy vegetables, 1/4 starches, and 1/4 protein. Recommended breakfast be a balance of starch and protein with a piece of fruit. Discussed with patient the importance of working towards hitting the proportions of the balanced plate consistently.  Educated patient on the nutritional value of each food group on the balanced plate model. Educated patient on the importance of eating regularly throughout the day to improve  metabolism. Advise patient to refrain from fasting during the entire work day. Counseled patient on beginning to rebuild their trust in themselves to make the right food choices for their health. Educated patient on mindful eating, including listening to their body's hunger and satiety cues, as well as eating slowly and allowing meals to be more of a sensory  experience.  Educate pt on factors that can elevate cholesterol, including high dietary intake of saturated fats. Educate pt on identifying sources of saturated fats, and how to make alternative food choices to lower saturated fat intake. Educate pt on the potential dietary causes of hypertriglyceridemia. Educate pt on the role of physical activity in lowering cholesterol. Educated patient on the importance of physical activity related to fatty liver. Educated patient that weight loss as a result of dietary and lifestyle changes is a secondary indicator of progress made to improve fatty liver.   Handouts Provided Include   Balanced Plate  Balanced Plate food lsit  Learning Style & Readiness for Change Teaching method utilized: Visual & Auditory  Demonstrated degree of understanding via: Teach Back  Barriers to learning/adherence to lifestyle change: none  Goals Established by Pt  Make it a point to stop and have your lunch. Try setting an alarm on your phone to interrupt your work day and remind you to eat!  As you begin to eat a more consistent lunch, work towards being a little more physically active.   Consider taking a walk at [facility] after work, [place] you drive home.  Continue to look for places to have a little bit more "ME" time.  Work towards eating 3 balanced meals every day following the balanced plate.  Remember that saturated fats comes from animal products.   Choose lean meats and low fat dairy!  Consider Powerade Zero to drink!  MONITORING & EVALUATION Dietary intake, weekly physical activity, and meal pattern PRN.  Next Steps  Patient is to call to set up a follow up as needed.

## 2020-11-24 NOTE — Patient Instructions (Addendum)
Make it a point to stop and have your lunch. Try setting an alarm on your phone to interrupt your work day and remind you to eat!  As you begin to eat a more consistent lunch, work towards being a little more physically active.  Consider taking a walk at [facility] after work, [place] you drive home.  Continue to look for places to have a little bit more "ME" time.  Work towards eating 3 balanced meals every day following the balanced plate.  Remember that saturated fats comes from animal products.  Choose lean meats and low fat dairy!  Consider Powerade Zero to drink!

## 2020-12-30 ENCOUNTER — Telehealth: Payer: Self-pay | Admitting: Gastroenterology

## 2020-12-30 DIAGNOSIS — R7989 Other specified abnormal findings of blood chemistry: Secondary | ICD-10-CM

## 2020-12-30 NOTE — Telephone Encounter (Signed)
Dr Ardis Hughs the pt wants to know if it is ok to start the new cholesterol med (nexlizet) prescribed by cardiology.  She is concerned about it affecting her liver.

## 2020-12-30 NOTE — Telephone Encounter (Signed)
Inbound call from patient stating her cardiologist prescribed nexlizet for her cholesterol but she wants to make sure she can take it because of her liver.  Please advise.

## 2020-12-31 NOTE — Telephone Encounter (Signed)
Patient returned call. Stated to please call her back.

## 2020-12-31 NOTE — Telephone Encounter (Signed)
It is probably safe but we should keep a close eye on her liver while starting the med.  Tell her she should be started on a relatively low dose. LFTs (through [place]) 4 weeks after starting that medicine.

## 2020-12-31 NOTE — Telephone Encounter (Signed)
Left message on machine to call back  

## 2020-12-31 NOTE — Telephone Encounter (Signed)
The pt has been advised that Dr Ardis Hughs prefers lowest dose and repeat labs in 4 weeks.  Pt agrees and orders entered.

## 2021-04-07 IMAGING — US US ABDOMEN LIMITED
1 series · 14 of 25 positions shown · non-contrast
Comparison: None.

CLINICAL DATA: Elevated liver function tests.

EXAM:
ULTRASOUND ABDOMEN LIMITED RIGHT UPPER QUADRANT

[Series 1: us abdomen limited · 14 of 52 slices shown]
[im 1/52]
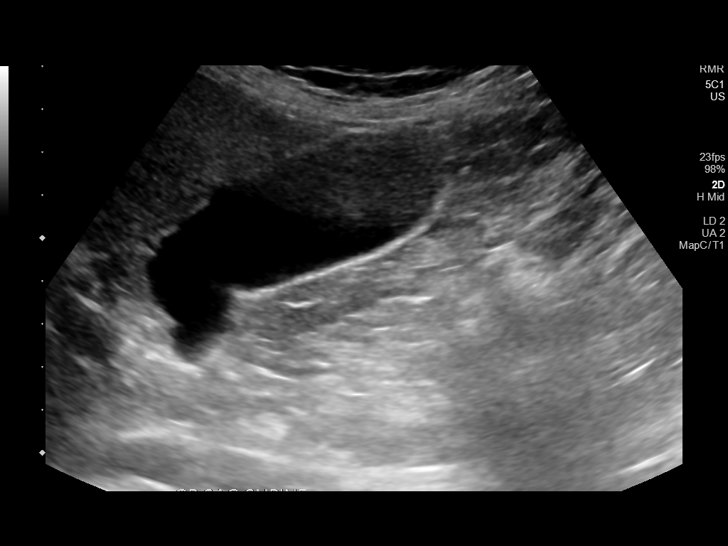
[im 5/52]
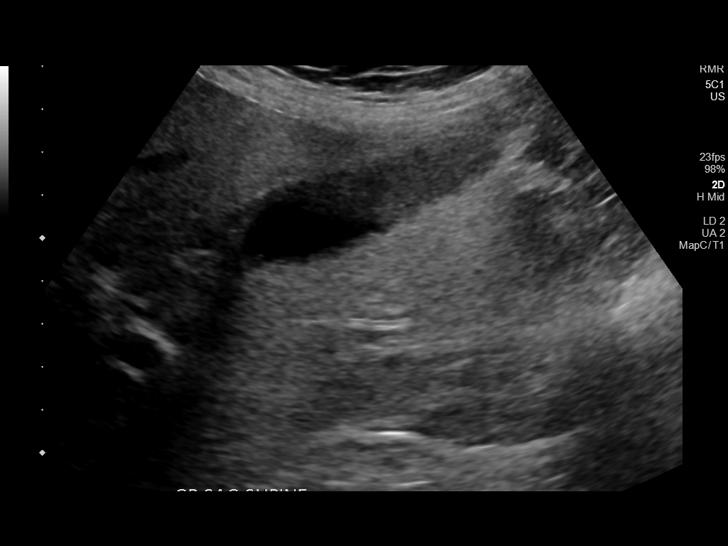
[im 9/52]
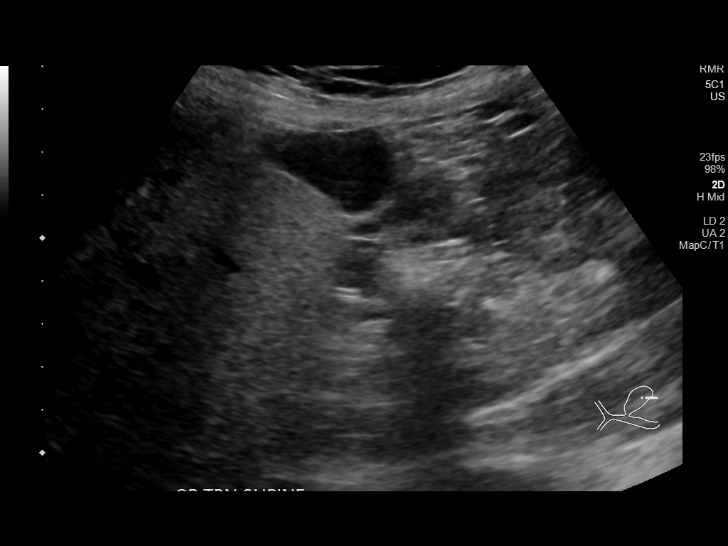
[im 13/52]
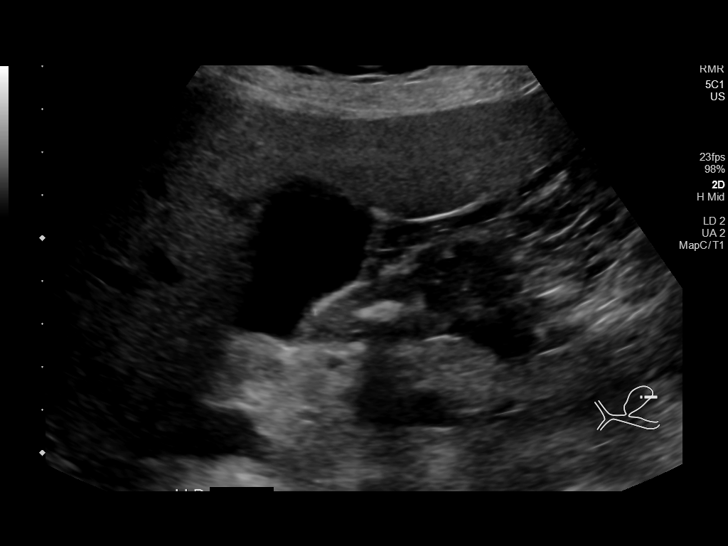
[im 18/52]
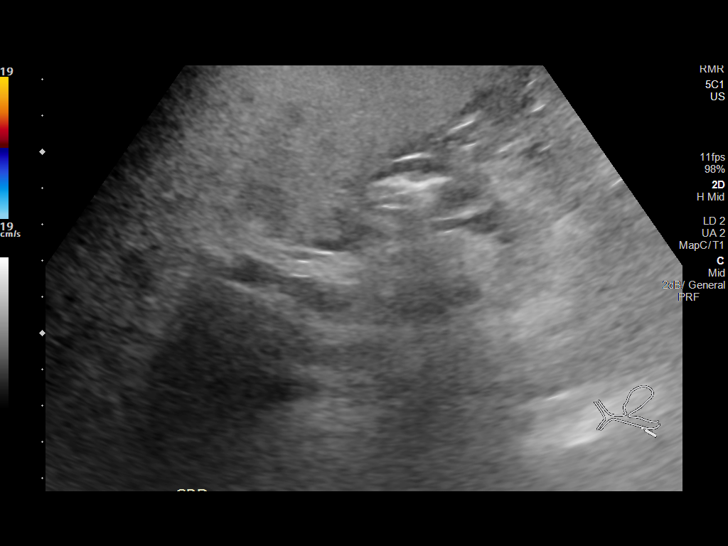
[im 20/52]
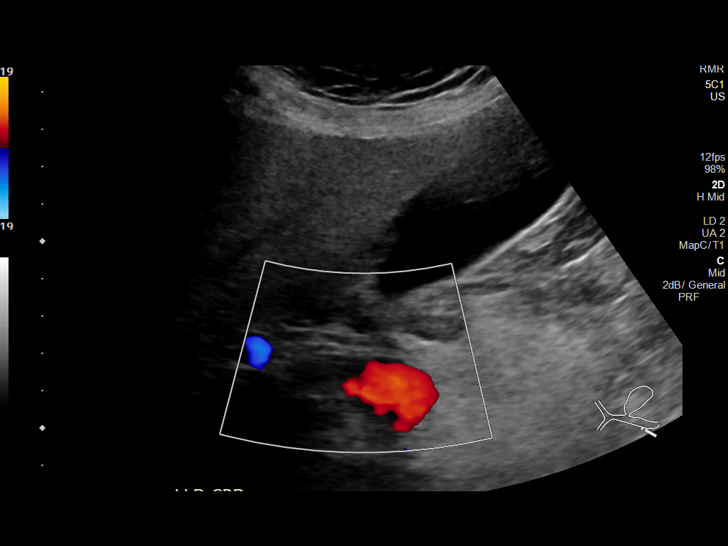
[im 24/52]
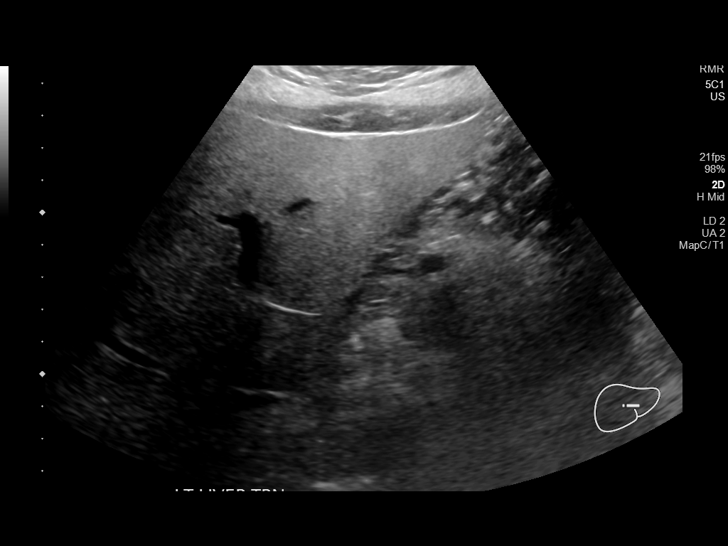
[im 28/52]
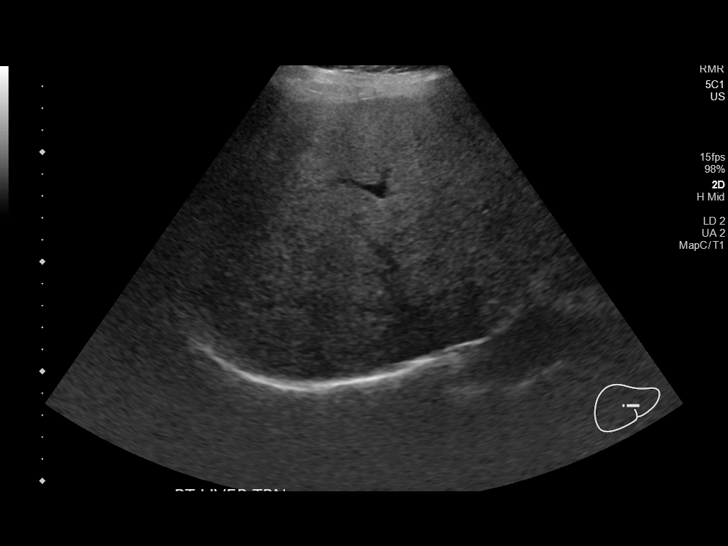
[im 32/52]
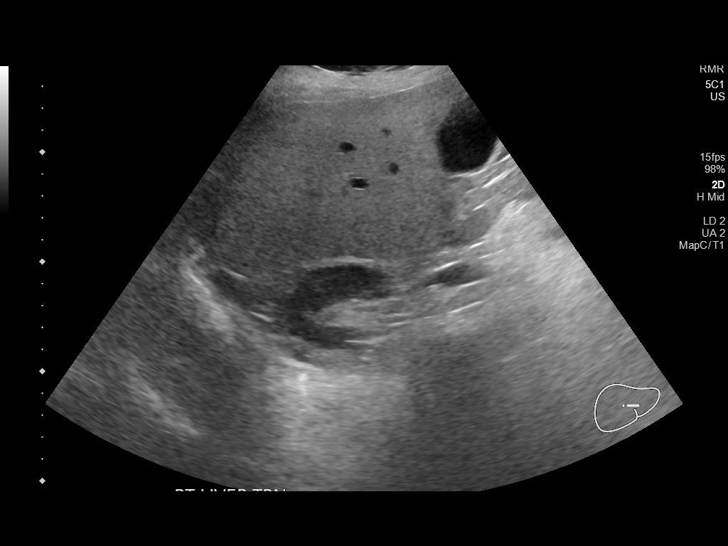
[im 35/52]
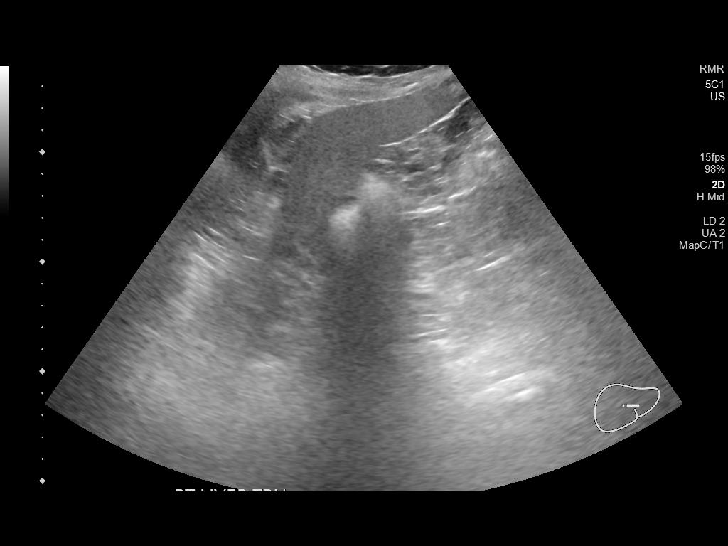
[im 39/52]
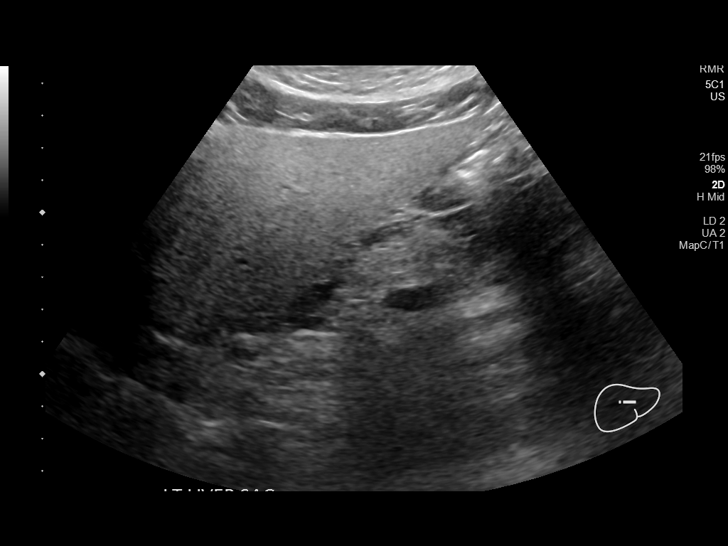
[im 43/52]
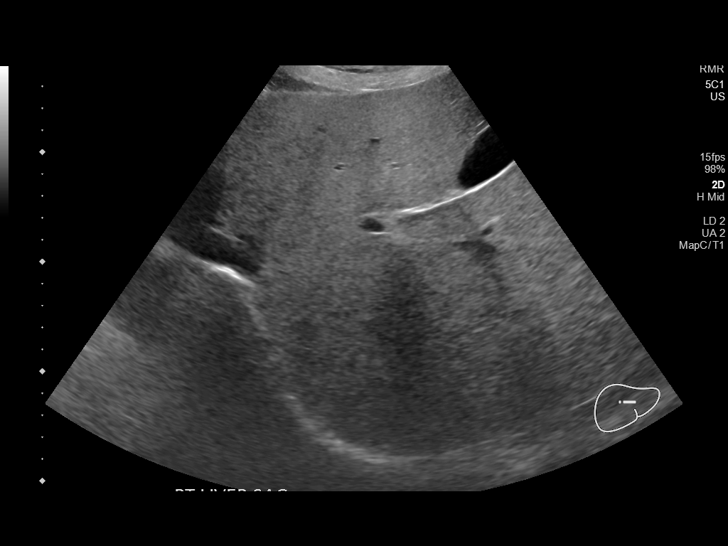
[im 47/52]
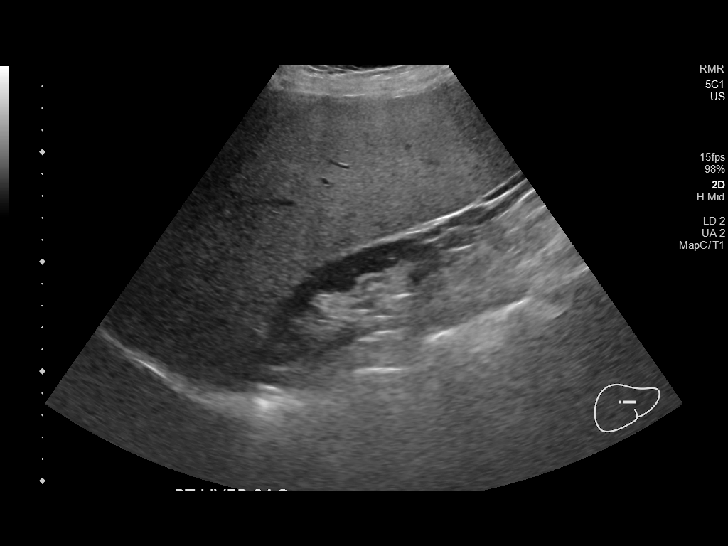
[im 52/52]
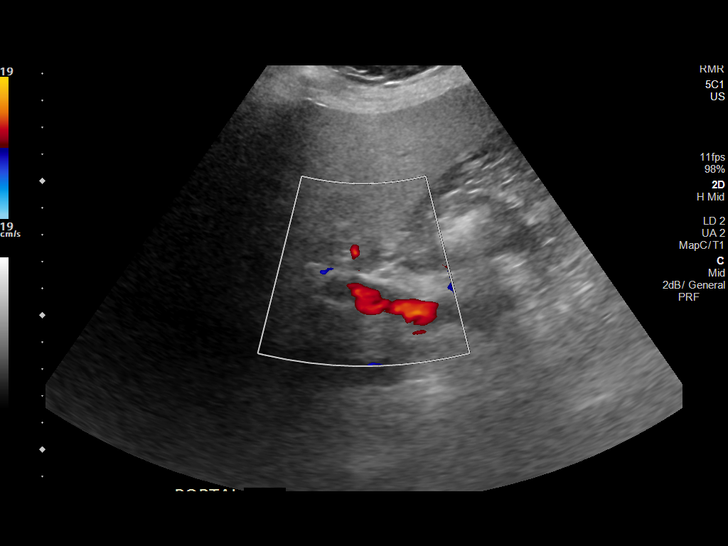

[14 of 25 positions shown; findings below may reference images not displayed]

FINDINGS: Gallbladder:

No gallstones or wall thickening visualized. No sonographic Murphy
sign noted by sonographer.

Common bile duct:

Diameter: 3 mm, within normal limits.

Liver:

Diffusely increased echogenicity of the hepatic parenchyma,
consistent with hepatic steatosis. No hepatic mass identified.
Portal vein is patent on color Doppler imaging with normal direction
of blood flow towards the liver.

Other: None.
IMPRESSION: No evidence of cholelithiasis or biliary ductal dilatation.

Diffuse hepatic steatosis.

## 2022-06-23 DIAGNOSIS — C8590 Non-Hodgkin lymphoma, unspecified, unspecified site: Secondary | ICD-10-CM | POA: Insufficient documentation

## 2022-06-24 ENCOUNTER — Inpatient Hospital Stay: Payer: Managed Care, Other (non HMO)

## 2022-06-24 ENCOUNTER — Encounter: Payer: Self-pay | Admitting: Hematology

## 2022-06-24 ENCOUNTER — Inpatient Hospital Stay: Payer: Managed Care, Other (non HMO) | Attending: Hematology | Admitting: Hematology

## 2022-06-24 DIAGNOSIS — R61 Generalized hyperhidrosis: Secondary | ICD-10-CM | POA: Diagnosis not present

## 2022-06-24 DIAGNOSIS — D479 Neoplasm of uncertain behavior of lymphoid, hematopoietic and related tissue, unspecified: Secondary | ICD-10-CM

## 2022-06-24 DIAGNOSIS — C8590 Non-Hodgkin lymphoma, unspecified, unspecified site: Secondary | ICD-10-CM

## 2022-06-24 DIAGNOSIS — R222 Localized swelling, mass and lump, trunk: Secondary | ICD-10-CM

## 2022-06-24 DIAGNOSIS — R221 Localized swelling, mass and lump, neck: Secondary | ICD-10-CM | POA: Diagnosis not present

## 2022-06-24 DIAGNOSIS — E109 Type 1 diabetes mellitus without complications: Secondary | ICD-10-CM | POA: Diagnosis not present

## 2022-06-24 LAB — CBC WITH DIFFERENTIAL/PLATELET
Abs Immature Granulocytes: 0.04 10*3/uL (ref 0.00–0.07)
Basophils Absolute: 0.1 10*3/uL (ref 0.0–0.1)
Basophils Relative: 1 %
Eosinophils Absolute: 0.1 10*3/uL (ref 0.0–0.5)
Eosinophils Relative: 2 %
HCT: 43.7 % (ref 36.0–46.0)
Hemoglobin: 14.3 g/dL (ref 12.0–15.0)
Immature Granulocytes: 1 %
Lymphocytes Relative: 17 %
Lymphs Abs: 1.3 10*3/uL (ref 0.7–4.0)
MCH: 28.8 pg (ref 26.0–34.0)
MCHC: 32.7 g/dL (ref 30.0–36.0)
MCV: 87.9 fL (ref 80.0–100.0)
Monocytes Absolute: 0.3 10*3/uL (ref 0.1–1.0)
Monocytes Relative: 4 %
Neutro Abs: 6.2 10*3/uL (ref 1.7–7.7)
Neutrophils Relative %: 75 %
Platelets: 235 10*3/uL (ref 150–400)
RBC: 4.97 MIL/uL (ref 3.87–5.11)
RDW: 12.9 % (ref 11.5–15.5)
WBC: 8.1 10*3/uL (ref 4.0–10.5)
nRBC: 0 % (ref 0.0–0.2)

## 2022-06-24 LAB — HEPATITIS B SURFACE ANTIBODY,QUALITATIVE: Hep B S Ab: REACTIVE — AB

## 2022-06-24 LAB — COMPREHENSIVE METABOLIC PANEL
ALT: 62 U/L — ABNORMAL HIGH (ref 0–44)
AST: 59 U/L — ABNORMAL HIGH (ref 15–41)
Albumin: 4.6 g/dL (ref 3.5–5.0)
Alkaline Phosphatase: 70 U/L (ref 38–126)
Anion gap: 8 (ref 5–15)
BUN: 19 mg/dL (ref 8–23)
CO2: 25 mmol/L (ref 22–32)
Calcium: 9.3 mg/dL (ref 8.9–10.3)
Chloride: 106 mmol/L (ref 98–111)
Creatinine, Ser: 0.81 mg/dL (ref 0.44–1.00)
GFR, Estimated: >60 mL/min (ref >60)
Glucose, Bld: 107 mg/dL — ABNORMAL HIGH (ref 70–99)
Potassium: 4.8 mmol/L (ref 3.5–5.1)
Sodium: 139 mmol/L (ref 135–145)
Total Bilirubin: 0.8 mg/dL (ref 0.3–1.2)
Total Protein: 7.7 g/dL (ref 6.5–8.1)

## 2022-06-24 LAB — HIV ANTIBODY (ROUTINE TESTING W REFLEX): HIV Screen 4th Generation wRfx: NONREACTIVE

## 2022-06-24 LAB — HEPATITIS B SURFACE ANTIGEN: Hepatitis B Surface Ag: NONREACTIVE

## 2022-06-24 LAB — URIC ACID: Uric Acid, Serum: 7.2 mg/dL — ABNORMAL HIGH (ref 2.5–7.1)

## 2022-06-24 LAB — LACTATE DEHYDROGENASE: LDH: 202 U/L — ABNORMAL HIGH (ref 98–192)

## 2022-06-24 LAB — HEPATITIS B CORE ANTIBODY, TOTAL: Hep B Core Total Ab: NONREACTIVE

## 2022-06-24 LAB — HEPATITIS C ANTIBODY: HCV Ab: NONREACTIVE

## 2022-06-24 NOTE — Patient Instructions (Addendum)
[facility]  Discharge Instructions  You were seen and examined today by Dr. Delton Coombes. Dr. Delton Coombes is a medical oncologist, meaning that he specializes in the treatment of cancer diagnoses. Dr. Delton Coombes discussed your past medical history, family history of cancers, and the events that led to you being here today.  You were referred to Dr. Delton Coombes due to an abnormal biopsy that was inconclusive. It is questionable for Lymphoma, but not conclusive.  Dr. Delton Coombes has recommended additional labs today. Dr. Delton Coombes has also recommended a PET scan, which is a specialized CT scan that illuminates where there is cancer present within the body. This will identify the best place to re-biopsy if needed.  Dr. Delton Coombes would also like for you to have an updated echocardiogram as a baseline cardiac function.  Follow-up as scheduled.  Thank you for choosing [facility] to provide your oncology and hematology care.   To afford each patient quality time with our provider, please arrive at least 15 minutes before your scheduled appointment time. You may need to reschedule your appointment if you arrive late (10 or more minutes). Arriving late affects you and other patients whose appointments are after yours.  Also, if you miss three or more appointments without notifying the office, you may be dismissed from the clinic at the provider's discretion.    Again, thank you for choosing [facility].  Our hope is that these requests will decrease the amount of time that you wait before being seen by our physicians.   If you have a lab appointment with the [facility] please come in thru the Main Entrance and check in at the main information desk.           _____________________________________________________________  Should you have questions after your visit to [facility], please contact our office at [phone] and follow the prompts.  Our office hours are 8:00 a.m. to 4:30 p.m. Monday - Thursday and 8:00 a.m. to 2:30 p.m. Friday.  Please note that voicemails left after 4:00 p.m. may not be returned until the following business day.  We are closed weekends and all major holidays.  You do have access to a nurse 24-7, just call the main number to the clinic [phone] and do not press any options, hold on the line and a nurse will answer the phone.    For prescription refill requests, have your pharmacy contact our office and allow 72 hours.    Masks are optional in the cancer centers. If you would like for your care team to wear a mask while they are taking care of you, please let them know. You may have one support person who is at least 63 years old accompany you for your appointments.

## 2022-06-24 NOTE — Progress Notes (Signed)
[facility] NOTE  Patient Care Team: Tempie Hoist, FNP as PCP - General (Family Medicine) Derek Jack, MD as Medical Oncologist (Medical Oncology) Brien Mates, RN as Oncology Nurse Navigator (Medical Oncology)  CHIEF COMPLAINTS/PURPOSE OF CONSULTATION:  B-cell lymphoproliferative disorder  HISTORY OF PRESENTING ILLNESS:  Debbie Hall 61 y.o. female is seen in consultation today at the request of Dr. Ladona Horns for further work-up and management of B-cell lymphoproliferative disorder.  She has noticed left shoulder pain and felt a lump in the left supraclavicular region few weeks ago.  This was followed by an ultrasound which showed suspicious masses.  A CT scan of the soft tissue of the neck was done followed by referral to Dr. Ladona Horns in [place].  She underwent excisional biopsy of the left supraclavicular lymph node on 06/10/2022.  Pathology shows atypical B-cell lymphoid infiltrate highly concerning for a lymphoproliferative disorder, possibly high-grade in the background of extensive granulomatous type inflammation and associated necrosis.  She reports having night sweats for a while with no fevers.  No weight loss.  She had diabetes 1 year ago and changed her diet and cut out carbohydrates and lost 40 pounds in the last 1 year.  No prior history of radiation therapy to the chest.  No dysphagia or odynophagia.  Denies any exposure to chemicals.  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  Non-smoker.  MEDICAL HISTORY:  Past Medical History:  Diagnosis Date   Anemia    Anxiety    Diabetes mellitus ([place])    Fatty liver    GERD (gastroesophageal reflux disease)    Gout    Hyperlipidemia    Hypertension    Migraines    Pneumonia    as a child    SURGICAL HISTORY: Past Surgical History:  Procedure Laterality Date   ABDOMINAL HYSTERECTOMY     partial   CESAREAN SECTION     x 2   TONSILLECTOMY      SOCIAL HISTORY: Social History    Socioeconomic History   Marital status: Married    Spouse name: Not on file   Number of children: 4   Years of education: Not on file   Highest education level: Not on file  Occupational History   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    Comment: [employer]  Tobacco Use   Smoking status: Never   Smokeless tobacco: Never  Vaping Use   Vaping Use: Never used  Substance and Sexual Activity   Alcohol use: Yes    Comment: occasional   Drug use: Never   Sexual activity: Not on file  Other Topics Concern   Not on file  Social History Narrative   Not on file   Social Determinants of Health   Financial Resource Strain: Not on file  Food Insecurity: Not on file  Transportation Needs: Not on file  Physical Activity: Not on file  Stress: Not on file  Social Connections: Not on file  Intimate Partner Violence: Not on file    FAMILY HISTORY: Family History  Problem Relation Age of Onset   Hypertension Mother    Heart disease Mother    Heart attack Mother    Pancreatic cancer Mother    Prostate cancer Father    Colon cancer Neg Hx    Esophageal cancer Neg Hx    Rectal cancer Neg Hx     ALLERGIES:  is allergic to avocado, banana, latex, lisinopril, and penicillins.  MEDICATIONS:  Current  Outpatient Medications  Medication Sig Dispense Refill   ALPRAZolam (XANAX) 0.5 MG tablet Take 0.5 mg by mouth 3 (three) times daily as needed for anxiety.     escitalopram (LEXAPRO) 5 MG tablet Take 1 tablet by mouth daily.     ibuprofen (ADVIL) 200 MG tablet Take 600 mg by mouth every 6 (six) hours as needed for moderate pain.     Insulin Glargine w/ Trans Port 100 UNIT/ML SOPN Inject 10 Units into the skin daily.     levothyroxine (SYNTHROID) 50 MCG tablet Take 50 mcg by mouth daily.     metoprolol tartrate (LOPRESSOR) 50 MG tablet Take 25 mg by mouth in the morning and at bedtime.     Omega-3 Fatty Acids (FISH OIL) 1000 MG CAPS Take by mouth.     ondansetron (ZOFRAN) 4  MG tablet Take 4 mg by mouth every 8 (eight) hours as needed for nausea or vomiting.     valACYclovir (VALTREX) 1000 MG tablet SMARTSIG:2 Tablet(s) By Mouth Every 12 Hours     VITAMIN E PO Take by mouth.     No current facility-administered medications for this visit.    REVIEW OF SYSTEMS:   Constitutional: Denies fevers, chills or abnormal night sweats Eyes: Denies blurriness of vision, double vision or watery eyes Ears, nose, mouth, throat, and face: Denies mucositis or sore throat Respiratory: Denies cough, dyspnea or wheezes Cardiovascular: Denies palpitation, chest discomfort or lower extremity swelling Gastrointestinal:  Denies heartburn or change in bowel habits.  Positive for nausea. Skin: Denies abnormal skin rashes Lymphatics: Denies new lymphadenopathy or easy bruising Neurological:Denies numbness, tingling or new weaknesses.  Positive for headaches. Behavioral/Psych: Mood is stable, no new changes  All other systems were reviewed with the patient and are negative.  PHYSICAL EXAMINATION: ECOG PERFORMANCE STATUS: 0 - Asymptomatic  Vitals:   06/24/22 1028  BP: 134/84  Pulse: 77  Resp: 16  Temp: 97.6 F (36.4 C)  SpO2: 97%   Filed Weights   06/24/22 1028  Weight: 171 lb 6.4 oz (77.7 kg)    GENERAL:alert, no distress and comfortable SKIN: skin color, texture, turgor are normal, no rashes or significant lesions EYES: normal, conjunctiva are pink and non-injected, sclera clear OROPHARYNX:no exudate, no erythema and lips, buccal mucosa, and tongue normal  NECK: supple, thyroid normal size, non-tender, without nodularity LYMPH:  no palpable lymphadenopathy in the cervical, axillary or inguinal LUNGS: clear to auscultation and percussion with normal breathing effort HEART: regular rate & rhythm and no murmurs and no lower extremity edema ABDOMEN:abdomen soft, non-tender and normal bowel sounds Musculoskeletal:no cyanosis of digits and no clubbing  PSYCH: alert &  oriented x 3 with fluent speech NEURO: no focal motor/sensory deficits  LABORATORY DATA:  I have reviewed the data as listed Lab Results  Component Value Date   WBC 8.1 06/24/2022   HGB 14.3 06/24/2022   HCT 43.7 06/24/2022   MCV 87.9 06/24/2022   PLT 235 06/24/2022     Chemistry      Component Value Date/Time   NA 139 06/24/2022 1142   K 4.8 06/24/2022 1142   CL 106 06/24/2022 1142   CO2 25 06/24/2022 1142   BUN 19 06/24/2022 1142   CREATININE 0.81 06/24/2022 1142      Component Value Date/Time   CALCIUM 9.3 06/24/2022 1142   ALKPHOS 70 06/24/2022 1142   AST 59 (H) 06/24/2022 1142   ALT 62 (H) 06/24/2022 1142   BILITOT 0.8 06/24/2022 1142  RADIOGRAPHIC STUDIES: I have personally reviewed the radiological images as listed and agreed with the findings in the report. No results found.  ASSESSMENT:  1.  Lymphoproliferative disorder: - CT soft tissue neck (06/06/2022): Left supraclavicular mass 1.9 x 1.6 cm central hypodensity.  Smaller adjacent nodule present.  Oval-shaped mass anterior to the left IJ vein 2.9 x 1.8 cm.  No definite 2 exophytic oropharyngeal or hypopharyngeal mass noted.  Thyroid intact.  Lung bases clear. - Left supraclavicular lymph node biopsy by Dr. Ladona Horns on 06/10/2022 - Pathology: Atypical B-cell lymphoid infiltrate highly concerning for a lymphoproliferative disorder, possibly high-grade in the background of extensive granulomatous type inflammation and necrosis.  2.  Social/family history: - She lives at home with her husband.  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  Non-smoker.  No history of exposure to chemicals or pesticides. - Mother died of pancreatic cancer.  Father had prostate cancer.  Sister had breast cancer at age 89.  Brother had kidney cancer at age 21.   PLAN:  1.  B-cell lymphoproliferative disorder: - We talked about her imaging studies and pathology report in detail. - Highly suspicious for high-grade lymphoma.   Hodgkin's disease also can present this way. - Recommend further work-up with a PET CT scan to evaluate extent of lymphoma and to select the biopsy site. - We will check LDH, uric acid, beta-2 microglobulin, CBC CMP and hepatitis B and C serology and HIV. - We will also check 2D echocardiogram. - RTC after the PET scan to discuss results and further plan.   Orders Placed This Encounter  Procedures   NM PET Image Initial (PI) Skull Base To Thigh    Standing Status:   Future    Standing Expiration Date:   06/24/2023    Order Specific Question:   If indicated for the ordered procedure, I authorize the administration of a radiopharmaceutical per Radiology protocol    Answer:   Yes    Order Specific Question:   Preferred imaging location?    Answer:   [facility]    Order Specific Question:   Release to patient    Answer:   Immediate   CBC with Differential    Standing Status:   Future    Number of Occurrences:   1    Standing Expiration Date:   06/24/2023   Comprehensive metabolic panel    Standing Status:   Future    Number of Occurrences:   1    Standing Expiration Date:   06/24/2023   Lactate dehydrogenase    Standing Status:   Future    Number of Occurrences:   1    Standing Expiration Date:   06/24/2023   Beta 2 microglobuline, serum    Standing Status:   Future    Number of Occurrences:   1    Standing Expiration Date:   06/24/2023   Uric acid    Standing Status:   Future    Number of Occurrences:   1    Standing Expiration Date:   06/24/2023   Hepatitis B core antibody, total    Standing Status:   Future    Number of Occurrences:   1    Standing Expiration Date:   06/25/2023   Hepatitis B surface antibody    Standing Status:   Future    Number of Occurrences:   1    Standing Expiration Date:   06/24/2023   Hepatitis B surface antigen  Standing Status:   Future    Number of Occurrences:   1    Standing Expiration Date:   06/24/2023   Hepatitis C Antibody    Standing  Status:   Future    Number of Occurrences:   1    Standing Expiration Date:   06/25/2023   HIV antibody (with reflex)    Standing Status:   Future    Number of Occurrences:   1    Standing Expiration Date:   06/25/2023   ECHOCARDIOGRAM COMPLETE    Standing Status:   Future    Standing Expiration Date:   06/25/2023    Order Specific Question:   Where should this test be performed    Answer:   [facility]    Order Specific Question:   Perflutren DEFINITY (image enhancing agent) should be administered unless hypersensitivity or allergy exist    Answer:   Administer Perflutren    Order Specific Question:   Is a special reader required? (athlete or structural heart)    Answer:   No    Order Specific Question:   Does this study need to be read by the Structural team/Level 3 readers?    Answer:   No    Order Specific Question:   Reason for exam-Echo    Answer:   Chemo  Z09    Order Specific Question:   Release to patient    Answer:   Immediate    All questions were answered. The patient knows to call the clinic with any problems, questions or concerns.      Derek Jack, MD 06/24/2022 12:51 PM

## 2022-06-25 LAB — BETA 2 MICROGLOBULIN, SERUM: Beta-2 Microglobulin: 2.5 mg/L — ABNORMAL HIGH (ref 0.6–2.4)

## 2022-06-28 ENCOUNTER — Ambulatory Visit (HOSPITAL_COMMUNITY)
Admission: RE | Admit: 2022-06-28 | Discharge: 2022-06-28 | Disposition: A | Discharge status: Home / Self Care | Payer: Managed Care, Other (non HMO) | Source: Ambulatory Visit | Attending: Hematology | Admitting: Hematology

## 2022-06-28 DIAGNOSIS — Z0189 Encounter for other specified special examinations: Secondary | ICD-10-CM | POA: Diagnosis not present

## 2022-06-28 DIAGNOSIS — C8590 Non-Hodgkin lymphoma, unspecified, unspecified site: Secondary | ICD-10-CM | POA: Insufficient documentation

## 2022-06-28 LAB — ECHOCARDIOGRAM COMPLETE
AR max vel: 1.94 cm2
AV Area VTI: 1.74 cm2
AV Area mean vel: 1.76 cm2
AV Mean grad: 3.8 mmHg
AV Peak grad: 6.8 mmHg
Ao pk vel: 1.3 m/s
Area-P 1/2: 2.26 cm2
MV M vel: 4.61 m/s
MV Peak grad: 85 mmHg
S' Lateral: 2.7 cm

## 2022-06-28 NOTE — Progress Notes (Signed)
*  PRELIMINARY RESULTS* Echocardiogram 2D Echocardiogram has been performed.  Debbie Hall 06/28/2022, 9:12 AM

## 2022-07-07 ENCOUNTER — Ambulatory Visit (HOSPITAL_COMMUNITY)
Admission: RE | Admit: 2022-07-07 | Discharge: 2022-07-07 | Disposition: A | Discharge status: Home / Self Care | Payer: BC Managed Care – PPO | Source: Ambulatory Visit | Attending: Hematology | Admitting: Hematology

## 2022-07-07 DIAGNOSIS — C8590 Non-Hodgkin lymphoma, unspecified, unspecified site: Secondary | ICD-10-CM | POA: Insufficient documentation

## 2022-07-07 MED ORDER — FLUDEOXYGLUCOSE F - 18 (FDG) INJECTION
9.4300 | Freq: Once | INTRAVENOUS | Status: AC | PRN
  Administered 2022-07-07: 9.43 via INTRAVENOUS

## 2022-07-13 ENCOUNTER — Inpatient Hospital Stay: Payer: Self-pay | Attending: Hematology | Admitting: Hematology

## 2022-07-13 VITALS — BP 139/68 | HR 84 | Temp 98.0°F | Resp 18 | Ht 65.0 in | Wt 170.0 lb

## 2022-07-13 DIAGNOSIS — C8590 Non-Hodgkin lymphoma, unspecified, unspecified site: Secondary | ICD-10-CM | POA: Diagnosis not present

## 2022-07-13 DIAGNOSIS — D479 Neoplasm of uncertain behavior of lymphoid, hematopoietic and related tissue, unspecified: Secondary | ICD-10-CM | POA: Insufficient documentation

## 2022-07-13 DIAGNOSIS — E109 Type 1 diabetes mellitus without complications: Secondary | ICD-10-CM | POA: Diagnosis not present

## 2022-07-13 NOTE — Progress Notes (Signed)
Debbie Hall, [place] [address]   CLINIC:  Medical Oncology/Hematology  PCP:  Tempie Hoist, FNP [address] [place] [address] [phone]   REASON FOR VISIT:  Follow-up for B-cell lymphoproliferative disorder  PRIOR THERAPY: None  NGS Results: Not done  CURRENT THERAPY: Under work-up  BRIEF ONCOLOGIC HISTORY:  Oncology History   No history exists.    CANCER STAGING: Cancer Staging  No matching staging information was found for the patient.   INTERVAL HISTORY:  Debbie Hall 61 y.o. female seen for follow-up of generalized lymphadenopathy and B-cell lymphoproliferative disorder.  She was seen by me on 06/24/2022 at the request of Dr. Ladona Horns in [place].  She underwent excisional biopsy of the left supraclavicular lymph node on 06/10/2022.  She does not report any B symptoms.  No recent infections.  Weight has been stable.  No prior history of radiation therapy to the chest.  She reports energy levels 50%.    REVIEW OF SYSTEMS:  Review of Systems  Psychiatric/Behavioral:  Positive for depression. The patient is nervous/anxious.   All other systems reviewed and are negative.    PAST MEDICAL/SURGICAL HISTORY:  Past Medical History:  Diagnosis Date   Anemia    Anxiety    Diabetes mellitus ([facility])    Fatty liver    GERD (gastroesophageal reflux disease)    Gout    Hyperlipidemia    Hypertension    Migraines    Pneumonia    as a child   Past Surgical History:  Procedure Laterality Date   ABDOMINAL HYSTERECTOMY     partial   CESAREAN SECTION     x 2   TONSILLECTOMY       SOCIAL HISTORY:  Social History   Socioeconomic History   Marital status: Married    Spouse name: Not on file   Number of children: 4   Years of education: Not on file   Highest education level: Not on file  Occupational History   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    Comment: [employer]  Tobacco Use   Smoking  status: Never   Smokeless tobacco: Never  Vaping Use   Vaping Use: Never used  Substance and Sexual Activity   Alcohol use: Yes    Comment: occasional   Drug use: Never   Sexual activity: Not on file  Other Topics Concern   Not on file  Social History Narrative   Not on file   Social Determinants of Health   Financial Resource Strain: Not on file  Food Insecurity: Not on file  Transportation Needs: Not on file  Physical Activity: Not on file  Stress: Not on file  Social Connections: Not on file  Intimate Partner Violence: Not on file    FAMILY HISTORY:  Family History  Problem Relation Age of Onset   Hypertension Mother    Heart disease Mother    Heart attack Mother    Pancreatic cancer Mother    Prostate cancer Father    Colon cancer Neg Hx    Esophageal cancer Neg Hx    Rectal cancer Neg Hx     CURRENT MEDICATIONS:  Outpatient Encounter Medications as of 07/13/2022  Medication Sig   ALPRAZolam (XANAX) 0.5 MG tablet Take 0.5 mg by mouth 3 (three) times daily as needed for anxiety.   escitalopram (LEXAPRO) 5 MG tablet Take 1 tablet by mouth daily.   ibuprofen (ADVIL) 200 MG tablet Take 600  mg by mouth every 6 (six) hours as needed for moderate pain.   Insulin Glargine w/ Trans Port 100 UNIT/ML SOPN Inject 10 Units into the skin daily.   levothyroxine (SYNTHROID) 50 MCG tablet Take 50 mcg by mouth daily.   metoprolol tartrate (LOPRESSOR) 50 MG tablet Take 25 mg by mouth in the morning and at bedtime.   Omega-3 Fatty Acids (FISH OIL) 1000 MG CAPS Take by mouth.   ondansetron (ZOFRAN) 4 MG tablet Take 4 mg by mouth every 8 (eight) hours as needed for nausea or vomiting.   valACYclovir (VALTREX) 1000 MG tablet SMARTSIG:2 Tablet(s) By Mouth Every 12 Hours   VITAMIN E PO Take by mouth.   No facility-administered encounter medications on file as of 07/13/2022.    ALLERGIES:  Allergies  Allergen Reactions   Avocado Itching   Banana Itching   Latex Itching    Eyes  watering, itching.   Lisinopril     angioedema   Penicillins     Facial swelling     PHYSICAL EXAM:  ECOG Performance status: 0  Vitals:   07/13/22 1121  BP: 139/68  Pulse: 84  Resp: 18  Temp: 98 F (36.7 C)  SpO2: 99%   Filed Weights   07/13/22 1121  Weight: 170 lb (77.1 kg)   Physical Exam Vitals reviewed.  Constitutional:      Appearance: Normal appearance.  Cardiovascular:     Rate and Rhythm: Normal rate and regular rhythm.  Pulmonary:     Breath sounds: Normal breath sounds.  Abdominal:     Palpations: Abdomen is soft. There is no mass.  Neurological:     Mental Status: She is alert.  Psychiatric:        Mood and Affect: Mood normal.        Behavior: Behavior normal.    Right groin lymphadenopathy palpable.  LABORATORY DATA:  I have reviewed the labs as listed.  CBC    Component Value Date/Time   WBC 8.1 06/24/2022 1142   RBC 4.97 06/24/2022 1142   HGB 14.3 06/24/2022 1142   HCT 43.7 06/24/2022 1142   PLT 235 06/24/2022 1142   MCV 87.9 06/24/2022 1142   MCH 28.8 06/24/2022 1142   MCHC 32.7 06/24/2022 1142   RDW 12.9 06/24/2022 1142   LYMPHSABS 1.3 06/24/2022 1142   MONOABS 0.3 06/24/2022 1142   EOSABS 0.1 06/24/2022 1142   BASOSABS 0.1 06/24/2022 1142      Latest Ref Rng & Units 06/24/2022   11:42 AM 09/07/2020    2:43 PM 04/06/2020    9:27 AM  CMP  Glucose 70 - 99 mg/dL 107  122  200   BUN 8 - 23 mg/dL '19  18  16   '$ Creatinine 0.44 - 1.00 mg/dL 0.81  0.77  1.05   Sodium 135 - 145 mmol/L 139  139  141   Potassium 3.5 - 5.1 mmol/L 4.8  3.6  4.7   Chloride 98 - 111 mmol/L 106  100  108   CO2 22 - 32 mmol/L '25  28  22   '$ Calcium 8.9 - 10.3 mg/dL 9.3  10.1  9.5   Total Protein 6.5 - 8.1 g/dL 7.7  8.1    Total Bilirubin 0.3 - 1.2 mg/dL 0.8  0.7    Alkaline Phos 38 - 126 U/L 70  84    AST 15 - 41 U/L 59  113    ALT 0 - 44 U/L 62  176  DIAGNOSTIC IMAGING:  I have independently reviewed the scans and discussed with the  patient.  ASSESSMENT:  1.  Lymphoproliferative disorder: - CT soft tissue neck (06/06/2022): Left supraclavicular mass 1.9 x 1.6 cm central hypodensity.  Smaller adjacent nodule present.  Oval-shaped mass anterior to the left IJ vein 2.9 x 1.8 cm.  No definite 2 exophytic oropharyngeal or hypopharyngeal mass noted.  Thyroid intact.  Lung bases clear. - Left supraclavicular lymph node biopsy by Dr. Ladona Horns on 06/10/2022 - Pathology: Atypical B-cell lymphoid infiltrate highly concerning for a lymphoproliferative disorder, possibly high-grade in the background of extensive granulomatous type inflammation and necrosis. - 2D echo (06/28/2022): LVEF 60-65%. - PET scan (07/07/2022): Markedly hypermetabolic adenopathy in the neck, chest, pelvis and inguinal regions.  Few subcentimeter pulmonary nodules are too small for PET resolution.  Hepatic steatosis.   2.  Social/family history: - She lives at home with her husband.  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  Non-smoker.  No history of exposure to chemicals or pesticides. - Mother died of pancreatic cancer.  Father had prostate cancer.  Sister had breast cancer at age 46.  Brother had kidney cancer at age 1.    PLAN:  1.  B-cell lymphoproliferative disorder: - We have reviewed her labs which showed uric acid is slightly high at 7.2.  She reports that she has gout.  LDH was minimally elevated at 202.  CBC was grossly normal.  Hepatitis B, C serology was negative. - We reviewed images of the PET scan which showed diffuse generalized adenopathy. - As the initial biopsy was not conclusive, I have recommended repeat biopsy.  I think right inguinal lymph node is easily accessible with an SUV of 28.8. - I have called Dr. Ladona Horns and have requested repeat biopsy of the right inguinal lymph node as soon as possible. - If the biopsy comes back as lymphoma, we will need urgent port placement. - Discussed at length with the patient and her husband. - RTC  after biopsy.      Orders placed this encounter:  No orders of the defined types were placed in this encounter.     Derek Jack, MD [place] [phone]

## 2022-07-13 NOTE — Patient Instructions (Addendum)
[place] at [facility] Discharge Instructions   You were seen and examined today by Dr. Delton Coombes.  He reviewed the results of your PET scan. We will plan to arrange for you to have a biopsy of the right groin lymph node for a definitive diagnosis. We will reach out to Dr. Tye Maryland to repeat biopsy.   Return as scheduled.    Thank you for choosing [place] at [facility] to provide your oncology and hematology care.  To afford each patient quality time with our provider, please arrive at least 15 minutes before your scheduled appointment time.   If you have a lab appointment with the [facility] please come in thru the Main Entrance and check in at the main information desk.  You need to re-schedule your appointment should you arrive 10 or more minutes late.  We strive to give you quality time with our providers, and arriving late affects you and other patients whose appointments are after yours.  Also, if you no show three or more times for appointments you may be dismissed from the clinic at the providers discretion.     Again, thank you for choosing [facility].  Our hope is that these requests will decrease the amount of time that you wait before being seen by our physicians.       _____________________________________________________________  Should you have questions after your visit to [facility], please contact our office at [phone] and follow the prompts.  Our office hours are 8:00 a.m. and 4:30 p.m. Monday - Friday.  Please note that voicemails left after 4:00 p.m. may not be returned until the following business day.  We are closed weekends and major holidays.  You do have access to a nurse 24-7, just call the main number to the clinic [phone] and do not press any options, hold on the line and a nurse will answer the phone.    For prescription refill requests, have your pharmacy contact our  office and allow 72 hours.    Due to Covid, you will need to wear a mask upon entering the hospital. If you do not have a mask, a mask will be given to you at the Main Entrance upon arrival. For doctor visits, patients may have 1 support person age 39 or older with them. For treatment visits, patients can not have anyone with them due to social distancing guidelines and our immunocompromised population.

## 2022-07-20 ENCOUNTER — Other Ambulatory Visit (HOSPITAL_COMMUNITY)
Admission: RE | Admit: 2022-07-20 | Discharge: 2022-07-20 | Disposition: A | Discharge status: Home / Self Care | Payer: Managed Care, Other (non HMO) | Source: Ambulatory Visit | Attending: Surgery | Admitting: Surgery

## 2022-07-20 DIAGNOSIS — R591 Generalized enlarged lymph nodes: Secondary | ICD-10-CM | POA: Diagnosis present

## 2022-07-27 ENCOUNTER — Other Ambulatory Visit: Payer: Self-pay | Admitting: *Deleted

## 2022-07-27 ENCOUNTER — Encounter: Payer: Self-pay | Admitting: Hematology

## 2022-07-27 ENCOUNTER — Other Ambulatory Visit: Payer: Self-pay | Admitting: Hematology

## 2022-07-27 DIAGNOSIS — C8330 Diffuse large B-cell lymphoma, unspecified site: Secondary | ICD-10-CM | POA: Insufficient documentation

## 2022-07-27 DIAGNOSIS — C8338 Diffuse large B-cell lymphoma, lymph nodes of multiple sites: Secondary | ICD-10-CM

## 2022-07-27 DIAGNOSIS — Z95828 Presence of other vascular implants and grafts: Secondary | ICD-10-CM

## 2022-07-27 MED ORDER — LIDOCAINE-PRILOCAINE 2.5-2.5 % EX CREA: 1.0000 | TOPICAL_CREAM | CUTANEOUS | 6 refills | Status: DC | PRN

## 2022-07-27 NOTE — Progress Notes (Signed)
START ON PATHWAY REGIMEN - Lymphoma and CLL     Cycles 1 through 6: A cycle is every 21 days:     Prednisone      Rituximab-xxxx      Polatuzumab vedotin-piiq      Cyclophosphamide      Doxorubicin      Pegfilgrastim-xxxx    Cycles 7 and 8: A cycle is every 21 days:     Rituximab-xxxx   **Always confirm dose/schedule in your pharmacy ordering system**  Patient Characteristics: Diffuse Large B-Cell Lymphoma or Follicular Lymphoma, Grade 3B, First Line, Stage III and IV Disease Type: Not Applicable Disease Type: Diffuse Large B-Cell Lymphoma Disease Type: Not Applicable Line of therapy: First Line Intent of Therapy: Curative Intent, Discussed with Patient 

## 2022-07-28 ENCOUNTER — Encounter: Payer: Self-pay | Admitting: Hematology

## 2022-07-28 ENCOUNTER — Other Ambulatory Visit: Payer: Self-pay

## 2022-07-28 DIAGNOSIS — Z95828 Presence of other vascular implants and grafts: Secondary | ICD-10-CM

## 2022-07-28 HISTORY — DX: Presence of other vascular implants and grafts: Z95.828

## 2022-07-28 MED ORDER — ALLOPURINOL 300 MG PO TABS: 300.0000 mg | ORAL_TABLET | Freq: Every day | ORAL | 3 refills | Status: DC

## 2022-07-28 MED ORDER — PREDNISONE 20 MG PO TABS: ORAL_TABLET | ORAL | 5 refills | Status: DC

## 2022-07-28 MED ORDER — LIDOCAINE-PRILOCAINE 2.5-2.5 % EX CREA: TOPICAL_CREAM | CUTANEOUS | 3 refills | Status: DC

## 2022-07-28 MED ORDER — PROCHLORPERAZINE MALEATE 10 MG PO TABS: 10.0000 mg | ORAL_TABLET | Freq: Four times a day (QID) | ORAL | 6 refills | Status: DC | PRN

## 2022-07-28 MED ORDER — ACYCLOVIR 400 MG PO TABS: 400.0000 mg | ORAL_TABLET | Freq: Every day | ORAL | 3 refills | Status: DC

## 2022-07-28 NOTE — Progress Notes (Signed)
Orders received for patient to receive Pola-R-[facility]P - orders updated to reflect request.  .Pharmacist Chemotherapy Monitoring - Initial Assessment    Anticipated start date: 08/01/22   The following has been reviewed per standard work regarding the patient's treatment regimen: The patient's diagnosis, treatment plan and drug doses, and organ/hematologic function Lab orders and baseline tests specific to treatment regimen  The treatment plan start date, drug sequencing, and pre-medications Prior authorization status  Patient's documented medication list, including drug-drug interaction screen and prescriptions for anti-emetics and supportive care specific to the treatment regimen The drug concentrations, fluid compatibility, administration routes, and timing of the medications to be used The patient's access for treatment and lifetime cumulative dose history, if applicable  The patient's medication allergies and previous infusion related reactions, if applicable   Changes made to treatment plan:  pre-medications Famotidine and drug offset times and add Polivy at 1.8 mg/kg  Follow up needed:  Pending authorization for treatment    Wynona Neat, [facility], 07/28/2022  9:50 AM

## 2022-07-28 NOTE — Patient Instructions (Addendum)
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  You will be treated in the clinic every 3 weeks (for 6 cycles) with a combination of chemotherapy drugs, and a monoclonal antibody drug.  The drugs you will receive in the clinic are Adriamycin, Cytoxan, Polivy, and Rituxan. High dose prednisone is also a part of your treatment plan.  You will take this at home starting on the first day of each treatment, and then daily for the four days following the first day of each treatment. You do this with each cycle of treatment. You do NOT take the high dose prednisone on the weeks you are not receiving treatment. The intent of treatment is cure.  You will see the doctor regularly throughout treatment.  We will obtain blood work from you prior to every treatment and monitor your results to make sure it is safe to give your treatment. The doctor monitors your response to treatment by the way you are feeling, your blood work, and by obtaining scans periodically.  There will be wait times while you are here for treatment.  It will take about 30 minutes to 1 hour for your lab work to result.  Then there will be wait times while pharmacy mixes your medications.     Medications you will receive in the clinic prior to your chemotherapy medications:  Aloxi:  ALOXI is used in adults to help prevent nausea and vomiting that happens with certain chemotherapy drugs.  Aloxi is a long acting medication, and will remain in your system for about two days.   Emend:  This is an anti-nausea medication that is used with Aloxi to help prevent nausea and vomiting caused by chemotherapy.  Dexamethasone:  This is a steroid given prior to chemotherapy to help prevent allergic reactions; it may also help prevent and control nausea and diarrhea.   Pepcid:  This medication is a histamine blocker that helps prevent and allergic reaction to Rituxan and Polivy   Benadryl:   This is a histamine blocker (different from the Pepcid) that helps prevent allergic/infusion reactions to the Rituxan and Polivy. This medication may cause dizziness/drowsiness.    Tylenol:  Given to prevent infusion reactions to Rituxan and Polivy.  You will get Neulasta (or similar) after every treatment of chemo:  Neulasta (or similar) - this medication is not chemotherapy but is being given because you have had chemotherapy that puts you at risk for severely low white blood cell count. It is usually given 24-48 hours after the completion of chemotherapy. This medication works by boosting your bone marrow's supply of white blood cells. White blood cells are what protect our bodies against infection. The medication is given in the form of a subcutaneous injection. It is given in the fatty tissue of your abdomen, or in the skin on the back of your arm . It is a short needle. The major side effect of this medication is bone or muscle pain. The drug of choice to relieve or lessen the pain is Aleve or Ibuprofen. If a physician has ever told you not to take Aleve or Ibuprofen - then don't take it. You should then take Tylenol/acetaminophen. Take either medication as the bottle directs you to.  The level of pain you experience as a result of this injection can range from none, to mild or moderate, or severe. Please let us know if you develop moderate or severe bone pain. You can also take  Claritin 10 mg over the counter starting on the day of your treatment and for a few days after receiving Neulasta to help with the bone aches and pains.      Doxorubicin (Generic Name) Other Names: Adriamycin, hydroxyl daunorubicin  About This Drug  Doxorubicin is a drug used to treat cancer. This drug is given in the vein (IV).  This is an IV push that will take about 5-10 minutes.  Possible Side Effects (More Common)   Bone marrow depression. This is a decrease in the number of white blood cells, red blood cells,  and platelets. This may raise your risk of infection, make you tired and weak (fatigue), and raise your risk of bleeding.   Hair loss: Hair loss is often complete scalp hair loss and can involve loss of eyebrows, eyelashes, and pubic hair. You may notice this a few days or weeks after treatment has started. Most often hair loss is temporary; your hair should grow back when treatment is done.   Nausea and throwing up (vomiting). These symptoms may happen within a few hours after your treatment and may last up to 24 hours. Medicines are available to stop or lessen these side effects.   Soreness of the mouth and throat. You may have red areas, white patches, or sores that hurt.   Change in the color of your urine to pink or red. This color change will go away in one to two days.   Effects on the heart: This drug can weaken the heart and lower heart function. Your heart function will be checked as needed. You may have trouble catching your breath, mainly during activities.  You may also have trouble breathing while lying down, and have swelling in your ankles.   Sensitivity to light (photosensitivity). Photosensitivity means that you may become more sensitive to the effects of the sun, sun lamps, and tanning beds. Your eyes may water more, mostly in bright light.   Metallic taste in the mouth: This may change the taste of food and drinks   Decreased appetite (decreased hunger)   Darkening of the skin or nails   Weakness that interferes with your daily activities   Possible Side Effects (Less Common)   Skin and tissue irritation may involve redness, pain, warmth, or swelling at the IV site. This happens if the drug leaks out of the vein and into nearby tissue.   Changes in your liver function. Your doctor will check your liver function as needed.   This drug may cause an increased risk of developing a second cancer  Allergic Reaction  Serious allergic reactions, including anaphylaxis are  rare. While you are getting this drug in your vein (IV), tell your nurse right away if you have any of these symptoms of an allergic reaction:   Trouble catching your breath   Feeling like your tongue or throat are swelling   Feeling your heart beat quickly or in a not normal way (palpitations)   Feeling dizzy or lightheaded   Flushing, itching, rash, and/or hives   Treating Side Effects   Drink 6-8 cups of fluids every day unless your doctor has told you to limit your fluid intake due to some other health problem. A cup is 8 ounces of fluid. If you vomit or have diarrhea, you should drink more fluids so that you do not become dehydrated (lack water in the body due to losing too much fluid).   Ask your doctor or nurse about medicine that is  available to help stop or lessen nausea, throwing up, and/or loose bowel movements   Wear dark sunglasses and use sunscreen with SPF 30 or higher when you are outdoors even for a short time. Cover up when you are out in the sun. Wear wide-brimmed hats, long-sleeved shirts, and pants. Keep your neck, chest, and back covered.   Mouth care is very important. Your mouth care should consist of routine, gentle cleaning of your teeth or dentures and rinsing your mouth with a mixture of 1/2 teaspoon of salt in 8 ounces of water or  teaspoon of baking soda in 8 ounces of water. This should be done at least after each meal and at bedtime.   If you have mouth sores, avoid mouthwash that has alcohol. Avoid alcohol and smoking because they can bother your mouth and throat.   Talk with your nurse about getting a wig before you lose your hair. Also, call the [facility] at 800-ACS-2345 to find out information about the "Look Good, Feel Better" program close to where you live. It is a free program where women getting chemotherapy can learn about wigs, turbans and scarves as well as makeup techniques and skin and nail care.   While you are getting this  drug, please tell your nurse right away if you have any pain, redness, or swelling at the site of the IV infusion.   Food and Drug Interactions  There are no known interactions of doxorubicin with food. This drug may interact with other medicines. Tell your doctor and pharmacist about all the medicines and dietary supplements (vitamins, minerals, herbs and others) that you are taking at this time. The safety and use of dietary supplements and alternative diets are often not known. Using these might affect your cancer or interfere with your treatment. Until more is known, you should not use dietary supplements or alternative diets without your cancer doctor's help.   When to Call the Doctor  Call your doctor or nurse right away if you have any of these symptoms:   Fever of 100.4 F (38 C) or above   Chills   Easy bruising or bleeding   Wheezing or trouble breathing   Rash or itching   Feeling dizzy or lightheaded   Feeling that your heart is beating in a fast or not normal way (palpitations)   Loose bowel movements (diarrhea) more than 4 times a day or diarrhea with weakness or feeling lightheaded   Nausea that stops you from eating or drinking   Throwing up/vomiting   Signs of liver problems: dark urine, pale bowel movements, bad stomach pain, feeling very tired and weak, unusual itching, or yellowing of the eyes or skin,   During the IV infusion, if you have pain, redness, or swelling at the site of the IV infusion, please tell your nurse right away   Call your doctor or nurse as soon as possible if any of these symptoms happen:   Decreased urine   Pain in your mouth or throat that makes it hard to eat or drink   Nausea and throwing up that is not relieved by prescribed medicines   Rash that is not relieved by prescribed medicines   Swelling of legs, ankles, or feet   Weight gain of 5 pounds in one week (fluid retention)   Lasting loss of appetite or rapid weight  loss of five pounds in a week   Fatigue that interferes with your daily activities   Extreme weakness that  interferes with normal activities   Sexual Problems and Reproduction Concerns   Infertility warning: Sexual problems and reproduction concerns may happen. In both men and women, this drug may affect your ability to have children. This cannot be determined before your treatment. Talk with your doctor or nurse if you plan to have children. Ask for information on sperm or egg banking.   In men, this drug may interfere with your ability to make sperm, but it should not change your ability to have sexual relations.   In women, menstrual bleeding may become irregular or stop while you are getting this drug. Do not assume that you cannot become pregnant if you do not have a menstrual period.   Women may go through signs of menopause (change of life) like vaginal dryness or itching. Vaginal lubricants can be used to lessen vaginal dryness, itching, and pain during sexual relations.   Genetic counseling is available for you to talk about the effects of this drug therapy on future pregnancies. Also, a genetic counselor can look at the possible risk of problems in the unborn baby due to this medicine if an exposure happens during pregnancy.   Pregnancy warning: This drug may have harmful effects on the unborn baby, so effective methods of birth control should be used by you and your partner during your cancer treatment and for at least 6 months after treatment   Breast feeding warning: Women should not breast feed during treatment because this drug could enter the breast milk and badly harm a breast feeding baby.   Polatuzumab vedotin-piiq Denyse Dago)  About This Drug  Polatuzumab vedotin-piiq is used to treat cancer. It is given in the vein (IV). The first infusion is given over 90 minutes. All subsequent infusions are given over 30 minutes.   Possible Side Effects  Bone marrow suppression.  This is a decrease in the number of white blood cells, red blood cells, and platelets. This may raise your risk of infection, make you tired and weak, and raise your risk of bleeding.   Soreness of the mouth and throat. You may have red areas, white patches, or sores that hurt.   Nausea   Diarrhea (loose bowel movements)   Constipation   Tiredness   Fever   Decreased appetite (decreased hunger)   Effects on the nerves called peripheral neuropathy. You may feel numbness, tingling, or pain in your hands and feet. It may be hard for you to button your clothes, open jars, or walk as usual. The effect on the nerves may get worse with more doses of the drug. These effects get better in some people after the drug is stopped but it does not get better in all people.   Pneumonia   Increase in uric acid in the blood   Hair loss. Hair loss is often temporary, although with certain medicine, hair loss can sometimes be permanent. Hair loss may happen suddenly or gradually. If you lose hair, you may lose it from your head, face, armpits, pubic area, chest, and/or legs. You may also notice your hair getting thin.  Note: Each of the side effects above was reported in 20% or greater of patients treated with polatuzumab vedotin-piiq. All possible side effects are not included. Your side effects may be different depending on your cancer diagnosis, condition, or if you are receiving other drugs in combination. Please discuss any concerns or questions with your medical team.  Warnings and Precautions  Severe peripheral neuropathy   Severe bone marrow  suppression   While you are getting this drug in your vein (IV), you may have a reaction to the drug. Sometimes you may be given medication to stop or lessen these side effects. Your nurse will check you closely for these signs: fever or shaking chills, flushing, facial swelling, feeling dizzy, headache, trouble breathing, rash, itching, chest tightness, or  chest pain. These reactions may happen after your infusion. If this happens, call 911 for emergency care.   Risk of severe and life-threatening infections   A rare virus can cause an infection that affects your central nervous system, and can be life threatening. The central nervous system is made up of your brain and spinal cord. You could feel extreme tiredness, agitation, confusion, have trouble understanding or speaking, trouble thinking and/or memory loss, difficulty walking, eyesight changes, numbness or lack of strength to your arms, legs, or seizures. If you start to have any of these symptoms let your doctor know right away.   Tumor lysis syndrome: This drug may act on the cancer cells very quickly. This may affect how your kidneys work.   Severe changes in your liver function  Note: Some of the side effects above are very rare. If you have concerns and/or questions, please discuss them with your medical team.  Important Information  This drug may be present in the saliva, tears, sweat, urine, stool, vomit, semen, and vaginal secretions. Talk to your doctor and/or your nurse about the necessary precautions to take during this time.  Treating Side Effects  Manage tiredness by pacing your activities for the day.   Be sure to include periods of rest between energy draining activities.   To decrease the risk of infection, wash your hands regularly.   Avoid close contact with people who have a cold, the flu, or other infections.   Take your temperature as your doctor or nurse tells you, and whenever you feel like you may have a fever.   To help decrease the risk of bleeding, use a soft toothbrush. Check with your nurse before using dental floss.   Be very careful when using knives or tools.   Use an electric shaver instead of a razor.   Drink plenty of fluids (a minimum of eight glasses per day is recommended).   If you throw up or have diarrhea, you should drink more fluids so  that you do not become dehydrated (lack of water in the body from losing too much fluid).   To help with nausea and vomiting, eat small, frequent meals instead of three large meals a day. Choose foods and drinks that are at room temperature. Ask your nurse or doctor about other helpful tips and medicine that is available to help stop or lessen these symptoms.   If you have diarrhea, eat low-fiber foods that are high in protein and calories and avoid foods that can irritate your digestive tracts or lead to cramping.   If you are not able to move your bowels, check with your doctor or nurse before you use enemas, laxatives, or suppositories.   Ask your nurse or doctor about medicine that can lessen or stop your diarrhea and/or constipation.   Mouth care is very important. Your mouth care should consist of routine, gentle cleaning of your teeth or dentures and rinsing your mouth with a mixture of 1/2 teaspoon of salt in 8 ounces of water or 1/2 teaspoon of baking soda in 8 ounces of water. This should be done at least after each  meal and at bedtime.   If you have mouth sores, avoid mouthwash that has alcohol. Also avoid alcohol and smoking because they can bother your mouth and throat.   To help with decreased appetite, eat small, frequent meals. Eat foods high in calories and protein, such as meat, poultry, fish, dry beans, tofu, eggs, nuts, milk, yogurt, cheese, ice cream, pudding, and nutritional supplements.   Consider using sauces and spices to increase taste. Daily exercise, with your doctor's approval, may increase your appetite.   If you have numbness and tingling in your hands and feet, be careful when cooking, walking, and handling sharp objects and hot liquids.   Infusion reactions may occur after your infusion. If this happens, call 911 for emergency care.   To help with hair loss, wash with a mild shampoo and avoid washing your hair every day. Avoid coloring your hair.   Avoid  rubbing your scalp, pat your hair or scalp dry.   Limit your use of hair spray, electric curlers, blow dryers, and curling irons.   If you are interested in getting a wig, talk to your nurse and they can help you get in touch with programs in your local area.  Food and Drug Interactions  This drug may interact with grapefruit and grapefruit juice. Talk to your doctor as this could make side effects worse.   Check with your doctor or pharmacist about all other prescription medicines and over-the-counter medicines and dietary supplements (vitamins, minerals, herbs, and others) you are taking before starting this medicine as there are known drug interactions with polatuzumab vedotin-piiq. Also, check with your doctor or pharmacist before starting any new prescription or over-the-counter medicines, or dietary supplements to make sure that there are no interactions.   This drug may interact with [place]. Talk to your doctor as this could make polatuzumab vedotin-piiq less effective.  When to Call the Doctor  Call your doctor or nurse if you have any of these symptoms and/or any new or unusual symptoms:  Fever of 100.4 F (38 C) or higher   Chills   Tiredness that interferes with your daily activities   Feeling dizzy or lightheaded   Easy bleeding or bruising   Extreme tiredness, agitation or confusion   Symptoms of a seizure such as confusion, blacking out, passing out, loss of hearing or vision, blurred vision, unusual smells or tastes (such as burning rubber), trouble talking, tremors or shaking in parts or all of the body, repeated body movements, tense muscles that do not relax, and loss of control of urine and bowels. If you or your family member suspects you are having a seizure, call 911 right away.   Trouble understanding or speaking   Trouble thinking and/or memory loss   Difficulty walking   Blurry vision or changes in your eyesight   Numbness or lack of strength  to your arms, legs, face, or body   Coughing yellow, green, or bloody mucus.   Wheezing and/or trouble breathing   Pain in your mouth or throat that makes it hard to eat or drink   Nausea that stops you from eating or drinking and/or is not relieved by prescribed medicines   Throwing up more than 3 times a day   Diarrhea, 4 times in one day or diarrhea with weakness or lightheadedness   No bowel movement in 3 days or when you feel uncomfortable   Lasting loss of appetite or rapid weight loss of five pounds in a week  Numbness, tingling, or pain in your hands and feet   Signs of infusion reaction: fever or shaking chills, flushing, facial swelling, feeling dizzy, headache, trouble breathing, rash, itching, chest tightness, or chest pain. If this happens, call 911 for emergency care.   Signs of liver problems: dark urine, pale bowel movements, pain in your abdomen, feeling very tired and weak, unusual itching, or yellowing of the eyes or skin   Signs of tumor lysis: confusion or agitation, decreased urine, nausea/vomiting, diarrhea, muscle cramping, numbness and/or tingling, seizures   Signs of infection: fever or chills, cough, trouble breathing, severe pain in your abdomen, difficulty urinating, burning or pain when you pass urine, redness and/or swelling of the skin   If you think you are pregnant or may have impregnated your partner  Reproduction Warnings  Pregnancy warning: This drug can have harmful effects on the unborn baby. Women of childbearing potential should use effective methods of birth control during your cancer treatment and for 3 months after stopping treatment. Men with female partners of childbearing potential should use effective methods of birth control during your cancer treatment and for 5 months after stopping treatment. Let your doctor know right away if you think you may be pregnant or may have impregnated your partner.   Breastfeeding warning: Women should  not breastfeed during treatment and for 2 months after stopping treatment because this drug could enter the breast milk and cause harm to a breastfeeding baby.   Fertility warning: In men and women both, this drug may affect your ability to have children in the future. Talk with your doctor or nurse if you plan to have children. Ask for information on sperm or egg banking.   Cyclophosphamide (Generic Name) Other Names: Cytoxan, Neosar  About This Drug  Cyclophosphamide is a drug used to treat cancer. It is given in the vein (IV) or by mouth. This drug takes 30 minutes to infuse.  Possible Side Effects (More Common)   Nausea and throwing up (vomiting). These symptoms may happen within a few hours after your treatment and may last up to 72 hours. Medicines are available to stop or lessen these side effects.   Bone marrow depression. This is a decrease in the number of white blood cells, red blood cells, and platelets. This may raise your risk of infection, make you tired and weak (fatigue), and raise your risk of bleeding.   Hair loss: You may notice hair getting thin. Some patients lose their hair. Hair loss is often complete scalp hair loss and can involve loss of eyebrows, eyelashes, and pubic hair. You may notice this a few days or weeks after treatment has started. Most often hair loss is temporary; your hair should grow back when treatment is done.   Decreased appetite (decreased hunger)   Blurred vision   Soreness of the mouth and throat. You may have red areas, white patches, or sores that hurt.   Effects on the bladder. This drug may cause irritation and bleeding in the bladder. You may have blood in your urine. To help stop this, you will get extra fluids to help you pass more urine. You may get a drug called mesna, which helps to decrease irritation and bleeding. You may also get a medicine to help you pass more urine. You may have a catheter (tube) placed in your bladder so that  your bladder will be washed with this drug.  Possible Side Effects (Less Common)   Darkening of the skin or nails  Metallic taste in the mouth   Changes in lung tissue may happen with large amounts of this drug. These changes may not last forever, and your lung tissue may go back to normal. Sometimes these changes may not be seen for many years. You may get a cough or have trouble catching your breath.   Allergic Reactions  Serious allergic reactions including anaphylaxis are rare. While you are getting this drug in your vein (IV), tell your nurse right away if you have any of these symptoms of an allergic reaction:   Trouble catching your breath   Feeling like your tongue or throat are swelling   Feeling your heart beat quickly or in a not normal way (palpitations)   Feeling dizzy or lightheaded   Flushing, itching, rash, and/or hives   Treating Side Effects   Drink 6-8 cups of fluids each day unless your doctor has told you to limit your fluid intake due to some other health problem. A cup is 8 ounces of fluid. If you throw up or have loose bowel movements you should drink more fluids so that you do not become dehydrated (lack water in the body due to losing too much fluid).   Ask your doctor or nurse about medicine that is available to help stop or lessen nausea or throwing up.   Mouth care is very important. Your mouth care should consist of routine, gentle cleaning of your teeth or dentures and rinsing your mouth with a mixture of 1/2 teaspoon of salt in 8 ounces of water or  teaspoon of baking soda in 8 ounces of water. This should be done at least after each meal and at bedtime.   If you have mouth sores, avoid mouthwash that has alcohol. Also avoid alcohol and smoking because they can bother your mouth and throat.   Talk with your nurse about getting a wig before you lose your hair. Also, call the [place] at [phone] to find out information about the  " Look Good.Marland KitchenMarland KitchenFeel Better" program close to where you live. It is a free program where women undergoing chemotherapy learn about wigs, turbans and scarves as well as makeup techniques and skin and nail care.  Important Information   Whenever you tell a doctor or nurse your health history, always tell them that you have received cyclophosphamide in the past.   If you take this drug by mouth swallow the medicine whole. Do not chew, break or crush it.   You can take the medicine with or without food. If you have nausea, take it with food. Do not take the pills at bedtime.  Food and Drug Interactions  There are no known interactions of cyclophosphamide with food. This drug may interact with other medicines. Tell your doctor and pharmacist about all the medicines and dietary supplements (vitamins, minerals, herbs and others) that you are taking at this time. The safety and use of dietary supplements and alternative diets are often not known. Using these might affect your cancer or interfere with your treatment. Until more is known, you should not use dietary supplements or alternative diets without your cancer doctor's help.   When to Call the Doctor  Call your doctor or nurse right away if you have any of these symptoms:   Fever of 100.4 F (38 C) or higher   Chills   Bleeding or bruising that is not normal   Blurred vision or other changes in eyesight   Pain when passing urine; blood in  urine   Pain in your lower back or side   Wheezing or trouble breathing   Swelling of legs, ankles, or feet   Feeling dizzy or lightheaded   Feeling confused or agitated   Signs of liver problems: dark urine, pale bowel movements, bad stomach pain, feeling very tired and weak, unusual itching, or yellowing of the eyes or skin   Unusual thirst or passing urine often   Nausea that stops you from eating or drinking   Throwing up/vomiting  Call your doctor or nurse as soon as possible if any of  these symptoms happen:   Pain in your mouth or throat that makes it hard to eat or drink   Nausea not relieved by prescribed medicines   Sexual Problems and Reproductive Concerns   Infertility warning: Sexual problems and reproduction concerns may happen. In both men and women, this drug may affect your ability to have children. This cannot be determined before your treatment. Talk with your doctor or nurse if you plan to have children. Ask for information on sperm or egg banking.   In men, this drug may interfere with your ability to make sperm, but it should not change your ability to have sexual relations.   In women, menstrual bleeding may become irregular or stop while you are getting this drug. Do not assume that you cannot become pregnant if you do not have a menstrual period.   Women may go through signs of menopause (change of life) like vaginal dryness or itching. Vaginal lubricants can be used to lessen vaginal dryness, itching, and pain during sexual relations.   Genetic counseling is available for you to talk about the effects of this drug therapy on future pregnancies. Also, a genetic counselor can look at the possible risk of problems in the unborn baby due to this medicine if an exposure happens during pregnancy.   Pregnancy warning: This drug may have harmful effects on the unborn child, so effective methods of birth control should be used during your cancer treatment.   Breast feeding warning: Women should not breast feed during treatment because this drug could enter the breast milk and badly harm a breast feeding baby.   Rituximab (Generic Name) Other Name: Rituxan  About This Drug  Rituximab is a monoclonal antibody used to treat cancer. This drug is given in the vein (IV).  This first time this is given it will be infused slower to monitor for infusion reactions.    Possible Side Effects (More Common)   Bone marrow depression. This is a decrease in the number  of white blood cells, red blood cells, and platelets. This  may raise your risk of infection, make you tired and weak (fatigue), and raise your risk of bleeding.   Rash-skin irritation, redness or itching (dermatitis)   Flu-like symptoms: fever, headache, muscle and joint aches, and fatigue (low energy, feeling weak)   Infusion-related reactions   Hepatitis B - if you have ever had hepatitis B, the virus may come back during treatment with this drug. Your doctor will test to see if you have ever had hepatitis B prior to your treatment.   Changes in your central nervous system can happen. The central nervous system is made up of your brain and spinal cord. You could feel: extreme tiredness, agitation, confusion, or have: hallucinations (see or hear things that are not there), trouble understanding or speaking, loss of control of your bowels or bladder, eyesight changes, numbness or lack of strength  to your arms, legs, face, or body, seizures or coma. If you start to have any of these symptoms let your doctor know right away.   Tumor lysis: This drug may act on the cancer cells very quickly. This may affect how your kidneys work. Your doctor will monitor your kidney function.   Changes in your liver function. Your doctor will check your liver function as needed.   Nausea and throwing up (vomiting): these symptoms may happen within a few hours after your treatment and may last up to 24 hours. Medicines are available to stop or lessen these side effects.   Loose bowel movements (diarrhea) that may last for a few days   Abdominal pain   Infections   Cough, runny nose   Swelling of your legs, ankles and/or feet or hands   High blood pressure. Your doctor will check your blood pressure as needed.   Abnormal heart beat  Possible Side Effects (Less Common)   Shortness of breath   Soreness of the mouth and throat. You may have red areas, white patches, or sores that hurt.  Infusion  Reactions  Infusion Reactions are the most common side effect linked to use of this drug and can be quite severe. Medicines will be given before you get the drug to lower the severity of this side effect. The infusion reactions are the worse with the first dose of the drug and become less severe with more doses of the drug. While you are getting this drug in your vein (IV), tell your nurse right away if you have any of these symptoms of an allergic reaction:   Trouble catching your breath   Feeling like your tongue or throat are swelling   Feeling your heart beat quickly or in a not normal way (palpitations)   Feeling dizzy or lightheaded   Flushing, itching, rash, and/or hives   Treating Side Effects   Ask your doctor or nurse about medicine to stop or lessen headache, loose bowel movements (diarrhea), constipation, nausea, throwing up (vomiting), or pain.   If you get a rash do not put anything it unless your doctor or nurse says you may. Keep the area around the rash clean and dry. Ask your doctor for medicine if the rash bothers you.   Drink 6-8 cups of fluids each day unless your doctor has told you to limit your fluid intake due to some other health problem. A cup is 8 ounces of fluid. If you throw up or have loose bowel movements, you should drink more fluids so that you do not become dehydrated (lack of water in the body from losing too much fluid).   If you are not able to move your bowels, check with your doctor or nurse before you use enemas, laxatives, or suppositories   If you have mouth sores, avoid mouthwash that has alcohol. Also avoid alcohol and smoking because they can bother your mouth and throat.   If you have a nose bleed, sit with your head tipped slightly forward. Apply pressure by lightly pinching the bridge of your nose between your thumb and forefinger. Call your doctor if you feel dizzy or faint or if the bleeding doesn't stop after 10 to 15  minutes   Important Information   After treatment with this drug, vaccination with live viruses should be delayed until the immune system recovers.   Symptoms of abnormal bleeding may be: coughing up blood, throwing up blood (may look like coffee grounds), red or  black, tarry bowel movements, blood in urine, abnormally heavy menstrual flow, nosebleeds, or any unusual bleeding.   Symptoms of high blood pressure may be: headache, blurred vision, confusion, chest pain, or a feeling that your heart is beat differently.   Urinary tract infection. Symptoms may include:   Pain or burning when you pass urine   Feeling like you have to pass urine often, but not much comes out when you do.   Tender or heavy feeling in your lower abdomen   Cloudy urine and/or urine that smells bad.   Pain on one side of your back under your ribs. This is where your kidneys are.   Fever, chills, nausea and/or throwing up   Food and Drug Interactions  There are no known interactions of rituximab and any food. This drug may interact with other medicines. Tell your doctor and pharmacist about all the medicines and dietary supplements (vitamins, minerals, herbs and others) that you are taking at this time. The safety and use of dietary supplements and alternative diets are often not known. Using these might affect your cancer or interfere with your treatment. Until more is known, you should not use dietary supplements or alternative diets without your cancer doctor's help.   When to Call the Doctor  Call your doctor or nurse right away if you have any of these symptoms:   Fever of 100.4 F (38 C) higher   Chills   Trouble breathing   Rash with or without itching   Blistering or peeling of skin   Chest pain or symptoms of a heart attack. Most heart attacks involve pain in the center of the chest that lasts more than a few minutes. The pain may go away and come back or it can be constant. It can feel like  pressure, squeezing, fullness, or pain. Sometimes pain is felt in one or both arms, the back, neck, jaw, or stomach. If any of these symptoms last 2 minutes, call 911   Easy bleeding or bruising   Blood in urine or bowel movements   Feeling that your heart is beating in a fast or not normal way (palpitations)   Nausea that stops you from eating or drinking   Throwing up/vomiting   Abdominal pain   Loose bowel movements (diarrhea) 4 times in one day or diarrhea with weakness or lightheadedness   No bowel movement in 3 days or if you feel uncomfortable   Feeling dizzy or lightheaded   Changes in your speech or vision   Feeling confused   Weakness of your arms and legs or poor coordination (feeling clumsy)   Signs of liver problems: dark urine, pale bowel movements, bad stomach pain, feeling very tired and weak, unusual itching, or yellowing of skin or eyes   Symptoms of a urinary tract infection (see important information)   Call your doctor or nurse as soon as possible if you have any of these symptoms:   Swelling of your legs, ankles and/or feet   Fatigue and /or weakness that interferes with your daily activities   Joint and muscle pain or muscle spasms that are not relieved by prescribed medicines   Cough that lasts longer than normal   Reproduction Concerns   Pregnancy warning: This drug is known to cross the placenta. This drug may have harmful effects on an unborn baby. Effective methods of birth control should be used during treatment with this drug and for 12 months after the last treatment. If exposure occurs to  an unborn baby, the baby's immune system may be affected, which could last for months after birth. Until the immune system recovers, live vaccines should not be administered to the baby. Be sure to talk with your doctor if you are pregnant or planning to become pregnant while getting this drug.   Breast feeding warning: It is not known if rituximab is  passed into human breast milk. In animal studies, this drug was detected in in breast milk. For this reason, women should talk to their doctor about the risks and benefits of breast feeding during treatment with this drug because this drug may enter the breast milk and badly harm a breast feeding baby.   Prednisone  About This Drug  Prednisone is a steroid that may be used to treat cancer. It is given orally (by mouth).  You will take this medication (100 mg daily with food) on Days 1-5.  Day 1 is each day you come to the clinic to get chemotherapy treatment.  You will start the prednisone on that day, and continue to take daily for the four days after, to total 5 days.  You will do this with each cycle of chemotherapy.  You do not take this medication on the weeks you are not getting treatment.   Possible Side Effects   Abnormal heart beat   Headache   High blood pressure   Increased sweating   Blurred vision or other changes in eyesight   Tiredness and weakness   Changes in mood, which may include depression or a feeling of extreme well-being   Trouble sleeping   Feeling restless (unable to relax)   Skin changes such as rash, dryness, or redness   Increased appetite (increased hunger)   Weight gain   Electrolyte changes   Increased risk of infections   Aggravation of stomach ulcers   Pain in your abdomen   Nausea   Blood sugar levels may change   Swelling of your legs, ankles and/or feet   Muscle loss and/or weakness (lack of muscle strength)   Increased risk of developing osteoporosis- your bones may become weak and brittle   Increased risk for cataracts, glaucoma or infections of the eye   Changes in your liver function  Note: Not all possible side effects are included above.  Warnings and Precautions   High blood pressure and changes in electrolytes, which can cause fluid build-up around your heart, lungs or elsewhere.   Severe infections, which can  be life-threatening.   Allergic reactions, including anaphylaxis are rare but may happen in some patients. Signs of allergic reaction to this drug may be swelling of the face, feeling like your tongue or throat are swelling, trouble breathing, rash, itching, fever, chills, feeling dizzy, and/or feeling that your heart is beating in a fast or not normal way. If this happens, do not take another dose of this drug. You should get urgent medical treatment.   Increased risk of developing a hole in your stomach, small, and/or large intestine if you have ulcers in the lining of your stomach and/or intestine, or have diverticulitis, ulcerative colitis and/or other diseases that affect the gastrointestinal tract.   Blurred vision or other changes in eyesight.   Severe depression and other psychiatric disorders such as mood changes.   Effects on the endocrine glands including pituitary, adrenals and thyroid during or after use of this medication.  Note: Some of the side effects above are very rare. If you have concerns and/or questions, please  discuss them with your medical team.  Important Information   Talk to your doctor or your nurse before stopping this medication, it should be stopped gradually. Depending on the dose and length of treatment, you could experience serious side effects if stopped abruptly (suddenly).   Talk to your doctor before receiving any vaccinations during your treatment. Some vaccinations are not recommended while receiving prednisone.  How to Take Your Medication   For Oral (by mouth): You can take the medicine with or without food, but it is preferable to be taken with food, especially If you have nausea or upset stomach.   Missed dose: If you miss or vomit a dose, contact your physician. Do not take 2 doses at the same time and do not double up on the next dose.   Handling: Wash your hands after handling your medicine, your caretakers should not handle your medicine with  bare hands and should wear latex gloves.   Storage: Store this medicine in the original container at room temperature, protect from moisture. Discuss with your nurse or your doctor how to dispose of unused medicine.   Treating Side Effects   Drink plenty of fluids (a minimum of eight glasses per day is recommended).   To help with nausea and vomiting, eat small, frequent meals instead of three large meals a day. Choose foods and drinks that are at room temperature. Ask your nurse or doctor about other helpful tips and medicine that is available to help stop or lessen these symptoms.   If you throw up, you should drink more fluids so that you do not become dehydrated (lack of water in the body from losing too much fluid).   Manage tiredness by pacing your activities for the day. Be sure to include periods of rest between energy-draining activities.   To help with muscle weakness, get regular exercise. If you feel too tired to exercise vigorously, try taking a short walk.   If you are having trouble sleeping, talk to your nurse or doctor on tips to help you sleep better.   If you are feeling depressed, talk to your nurse or doctor about it.   Keeping your pain under control is important to your well-being. Please tell your doctor or nurse if you are experiencing pain.   If you have diabetes, keep good control of your blood sugar level. Tell your nurse or your doctor if your glucose levels are higher or lower than normal.   To decrease the risk of infection, wash your hands regularly.   Avoid close contact with people who have a cold, the flu, or other infections.   Take your temperature as your doctor or nurse tells you, and whenever you feel like you may have a fever.   If you get a rash do not put anything on it unless your doctor or nurse says you may. Keep the area around the rash clean and dry. Ask your doctor for medicine if your rash bothers you.   Moisturize your skin several  times a day   Avoid sun exposure and apply sunscreen routinely when outdoors.   Food and Drug Interactions   This drug may interact with grapefruit and grapefruit juice. Talk to your doctor as this could make side effects worse.   Check with your doctor or pharmacist about all other prescription medicines and over-the-counter medicines and dietary supplements (vitamins, minerals, herbs and others) you are taking before starting this medicine as there are known drug interactions with prednisone.  Also, check with your doctor or pharmacist before starting any new prescription or over-the-counter medicines, or dietary supplements to make sure that there are no interactions.   There are known interactions of prednisone with other medicines and products like aspirin, and other nonsteroidal anti-inflammatory agents. Ask your doctor what over-the-counter (OTC) are safe to take while taking this medication.    There are known interactions of prednisone with blood thinning medicine such as warfarin. Ask your doctor what precautions you should take.   Avoid the use of St. John's Wort while taking prednisone as this may lower the levels of the drug in your body, which can make it less effective.   When to Call the Doctor  Call your doctor or nurse if you have any of these symptoms and/or any new or unusual symptoms:   Fever of 100.4 F (38 C) or higher   Chills   A headache that does not go away   Blurry vision or other changes in eyesight   Feel irritable, nervous or restless   Trouble sleeping   Tiredness or weakness that interferes with your daily activities   Trouble breathing   Feeling that your heart is beating in a fast or not normal way (palpitations)   Chest pain or symptoms of a heart attack. Most heart attacks involve pain in the center of the chest that lasts more than a few minutes. The pain may go away and come back or it can be constant. It can feel like pressure,  squeezing, fullness, or pain. Sometimes pain is felt in one or both arms, the back, neck, jaw, or stomach. If any of these symptoms last 2 minutes, call 911.   Nausea that stops you from eating or drinking and/or is not relieved by prescribed medicines   Throwing up   Severe abdominal pain that does not go away   Heartburn or indigestion   Abnormal blood sugar   Severe mood changes such as depression or unusual thoughts and/or behaviors   Thoughts of hurting yourself or others, and suicide   Feeling hopeless on most days   Unusual thirst, passing urine often, headache, sweating, shakiness, irritability   Swelling of legs, ankles, or feet   Weight gain of 5 pounds in one week (fluid retention)   A new rash or a rash that is not relieved by prescribed medicines   Signs of possible liver problems: dark urine, pale bowel movements, bad stomach pain, feeling very tired and weak, unusual itching, or yellowing of the eyes or skin   Signs of allergic reaction: swelling of the face, feeling like your tongue or throat are swelling, trouble breathing, rash, itching, fever, chills, feeling dizzy, and/or feeling that your heart is beating in a fast or not normal way. If this happens, call 911 for emergency care.   If you think you may be pregnant  Reproduction Warnings   Pregnancy warning: It is not known if this drug may harm an unborn child. For this reason, be sure to talk with your doctor if you are pregnant or planning to become pregnant while receiving this drug. Let your doctor know right away if you think you may be pregnant.   Breastfeeding warning: It is not known if this drug passes into breast milk. For this reason, women should talk to their doctor about the risks and benefits of breastfeeding during treatment with this drug because this drug may enter the breast milk and cause harm to a breastfeeding baby.  Fertility warning: Human fertility studies have not been done with  this drug. Talk with your doctor or nurse if you plan to have children. Ask for information on sperm or egg banking.    SELF CARE ACTIVITIES WHILE RECEIVING CHEMOTHERAPY:  Hydration Increase your fluid intake 48 hours prior to treatment and drink at least 8 to 12 cups (64 ounces) of water/decaffeinated beverages per day after treatment. You can still have your cup of coffee or soda but these beverages do not count as part of your 8 to 12 cups that you need to drink daily. No alcohol intake.  Medications Continue taking your normal prescription medication as prescribed.  If you start any new herbal or new supplements please let us know first to make sure it is safe.  Mouth Care Have teeth cleaned professionally before starting treatment. Keep dentures and partial plates clean. Use soft toothbrush and do not use mouthwashes that contain alcohol. Biotene is a good mouthwash that is available at most pharmacies or may be ordered by calling [phone]. Use warm salt water gargles (1 teaspoon salt per 1 quart warm water) before and after meals and at bedtime. If you need dental work, please let the doctor know before you go for your appointment so that we can coordinate the best possible time for you in regards to your chemo regimen. You need to also let your dentist know that you are actively taking chemo. We may need to do labs prior to your dental appointment.  Skin Care Always use sunscreen that has not expired and with SPF (Sun Protection Factor) of 50 or higher. Wear hats to protect your head from the sun. Remember to use sunscreen on your hands, ears, face, & feet.  Use good moisturizing lotions such as udder cream, eucerin, or even Vaseline. Some chemotherapies can cause dry skin, color changes in your skin and nails.    Avoid long, hot showers or baths. Use gentle, fragrance-free soaps and laundry detergent. Use moisturizers, preferably creams or ointments rather than lotions because the  thicker consistency is better at preventing skin dehydration. Apply the cream or ointment within 15 minutes of showering. Reapply moisturizer at night, and moisturize your hands every time after you wash them.  Hair Loss (if your doctor says your hair will fall out)  If your doctor says that your hair is likely to fall out, decide before you begin chemo whether you want to wear a wig. You may want to shop before treatment to match your hair color. Hats, turbans, and scarves can also camouflage hair loss, although some people prefer to leave their heads uncovered. If you go bare-headed outdoors, be sure to use sunscreen on your scalp. Cut your hair short. It eases the inconvenience of shedding lots of hair, but it also can reduce the emotional impact of watching your hair fall out. Don't perm or color your hair during chemotherapy. Those chemical treatments are already damaging to hair and can enhance hair loss. Once your chemo treatments are done and your hair has grown back, it's OK to resume dyeing or perming hair.  With chemotherapy, hair loss is almost always temporary. But when it grows back, it may be a different color or texture. In older adults who still had hair color before chemotherapy, the new growth may be completely gray.  Often, new hair is very fine and soft.  Infection Prevention Please wash your hands for at least 30 seconds using warm soapy water. Handwashing is the #1  way to prevent the spread of germs. Stay away from sick people or people who are getting over a cold. If you develop respiratory systems such as green/yellow mucus production or productive cough or persistent cough let us know and we will see if you need an antibiotic. It is a good idea to keep a pair of gloves on when going into grocery stores/[employer] to decrease your risk of coming into contact with germs on the carts, etc. Carry alcohol hand gel with you at all times and use it frequently if out in public. If your  temperature reaches 100.4 or higher please call the clinic and let us know.  If it is after hours or on the weekend please go to the ER if your temperature is over 100.4.  Please have your own personal thermometer at home to use.    Sex and bodily fluids If you are going to have sex, a condom must be used to protect the person that isn't taking chemotherapy. Chemo can decrease your libido (sex drive). For a few days after chemotherapy, chemotherapy can be excreted through your bodily fluids.  When using the toilet please close the lid and flush the toilet twice.  Do this for a few day after you have had chemotherapy.   Effects of chemotherapy on your sex life Some changes are simple and won't last long. They won't affect your sex life permanently.  Sometimes you may feel: too tired not strong enough to be very active sick or sore  not in the mood anxious or low  Your anxiety might not seem related to sex. For example, you may be worried about the cancer and how your treatment is going. Or you may be worried about money, or about how you family are coping with your illness. These things can cause stress, which can affect your interest in sex. It's important to talk to your partner about how you feel. Remember - the changes to your sex life don't usually last long. There's usually no medical reason to stop having sex during chemo. The drugs won't have any long term physical effects on your performance or enjoyment of sex. Cancer can't be passed on to your partner during sex  Contraception It's important to use reliable contraception during treatment. Avoid getting pregnant while you or your partner are having chemotherapy. This is because the drugs may harm the baby. Sometimes chemotherapy drugs can leave a man or woman infertile.  This means you would not be able to have children in the future. You might want to talk to someone about permanent infertility. It can be very difficult to learn that you  may no longer be able to have children. Some people find counselling helpful. There might be ways to preserve your fertility, although this is easier for men than for women. You may want to speak to a fertility expert. You can talk about sperm banking or harvesting your eggs. You can also ask about other fertility options, such as donor eggs. If you have or have had breast cancer, your doctor might advise you not to take the contraceptive pill. This is because the hormones in it might affect the cancer.  It is not known for sure whether or not chemotherapy drugs can be passed on through semen or secretions from the vagina. Because of this some doctors advise people to use a barrier method if you have sex during treatment. This applies to vaginal, anal or oral sex. Generally, doctors advise a barrier method  only for the time you are actually having the treatment and for about a week after your treatment. Advice like this can be worrying, but this does not mean that you have to avoid being intimate with your partner. You can still have close contact with your partner and continue to enjoy sex.  Animals If you have cats or birds we just ask that you not change the litter or change the cage.  Please have someone else do this for you while you are on chemotherapy.   Food Safety During and After Cancer Treatment Food safety is important for people both during and after cancer treatment. Cancer and cancer treatments, such as chemotherapy, radiation therapy, and stem cell/bone marrow transplantation, often weaken the immune system. This makes it harder for your body to protect itself from foodborne illness, also called food poisoning. Foodborne illness is caused by eating food that contains harmful bacteria, parasites, or viruses.  Foods to avoid Some foods have a higher risk of becoming tainted with bacteria. These include: Unwashed fresh fruit and vegetables, especially leafy vegetables that can hide dirt  and other contaminants Raw sprouts, such as alfalfa sprouts Raw or undercooked beef, especially ground beef, or other raw or undercooked meat and poultry Fatty, fried, or spicy foods immediately before or after treatment.  These can sit heavy on your stomach and make you feel nauseous. Raw or undercooked shellfish, such as oysters. Sushi and sashimi, which often contain raw fish.  Unpasteurized beverages, such as unpasteurized fruit juices, raw milk, raw yogurt, or cider Undercooked eggs, such as soft boiled, over easy, and poached; raw, unpasteurized eggs; or foods made with raw egg, such as homemade raw cookie dough and homemade mayonnaise  Simple steps for food safety  Shop smart. Do not buy food stored or displayed in an unclean area. Do not buy bruised or damaged fruits or vegetables. Do not buy cans that have cracks, dents, or bulges. Pick up foods that can spoil at the end of your shopping trip and store them in a cooler on the way home.  Prepare and clean up foods carefully. Rinse all fresh fruits and vegetables under running water, and dry them with a clean towel or paper towel. Clean the top of cans before opening them. After preparing food, wash your hands for 20 seconds with hot water and soap. Pay special attention to areas between fingers and under nails. Clean your utensils and dishes with hot water and soap. Disinfect your kitchen and cutting boards using 1 teaspoon of liquid, unscented bleach mixed into 1 quart of water.    Dispose of old food. Eat canned and packaged food before its expiration date (the "use by" or "best before" date). Consume refrigerated leftovers within 3 to 4 days. After that time, throw out the food. Even if the food does not smell or look spoiled, it still may be unsafe. Some bacteria, such as Listeria, can grow even on foods stored in the refrigerator if they are kept for too long.  Take precautions when eating out. At restaurants, avoid buffets  and salad bars where food sits out for a long time and comes in contact with many people. Food can become contaminated when someone with a virus, often a norovirus, or another "bug" handles it. Put any leftover food in a "to-go" container yourself, rather than having the server do it. And, refrigerate leftovers as soon as you get home. Choose restaurants that are clean and that are willing to prepare your food  as you order it cooked.   AT HOME MEDICATIONS:                                                                                                                                                                Compazine/Prochlorperazine '10mg'$  tablet. Take 1 tablet every 6 hours as needed for nausea/vomiting. (This can make you sleepy)   EMLA cream. Apply a quarter size amount to port site 1 hour prior to chemo. Do not rub in. Cover with plastic wrap.    Diarrhea Sheet   If you are having loose stools/diarrhea, please purchase Imodium and begin taking as outlined:  At the first sign of poorly formed or loose stools you should begin taking Imodium (loperamide) 2 mg capsules.  Take two tablets ('4mg'$ ) followed by one tablet ('2mg'$ ) every 2 hours - DO NOT EXCEED 8 tablets in 24 hours.  If it is bedtime and you are having loose stools, take 2 tablets at bedtime, then 2 tablets every 4 hours until morning.   Always call the [facility] if you are having loose stools/diarrhea that you can't get under control.  Loose stools/diarrhea leads to dehydration (loss of water) in your body.  We have other options of trying to get the loose stools/diarrhea to stop but you must let us know!  Constipation Sheet  Colace - 100 mg capsules - take 2 capsules daily.  If this doesn't help then you can increase to 2 capsules twice daily.  Please call if the above does not work for you. Do not go more than 2 days without a bowel movement.  It is very important that you do not become constipated.  It will make you feel  sick to your stomach (nausea) and can cause abdominal pain and vomiting.  Nausea Sheet   Compazine/Prochlorperazine '10mg'$  tablet. Take 1 tablet every 6 hours as needed for nausea/vomiting (This can make you drowsy).  If you are having persistent nausea (nausea that does not stop) please call the [facility] and let us know the amount of nausea that you are experiencing.  If you begin to vomit, you need to call the [facility] and if it is the weekend and you have vomited more than one time and can't get it to stop-go to the Emergency Room.  Persistent nausea/vomiting can lead to dehydration (loss of fluid in your body) and will make you feel very weak and unwell. Ice chips, sips of clear liquids, foods that are at room temperature, crackers, and toast tend to be better tolerated.    SYMPTOMS TO REPORT AS SOON AS POSSIBLE AFTER TREATMENT:  FEVER GREATER THAN 100.4 F  CHILLS WITH OR WITHOUT FEVER  NAUSEA AND VOMITING THAT IS NOT CONTROLLED WITH YOUR NAUSEA MEDICATION  UNUSUAL SHORTNESS  OF BREATH  UNUSUAL BRUISING OR BLEEDING  TENDERNESS IN MOUTH AND THROAT WITH OR WITHOUT   PRESENCE OF ULCERS  URINARY PROBLEMS  BOWEL PROBLEMS  UNUSUAL RASH    Wear comfortable clothing and clothing appropriate for easy access to any Portacath or PICC line. Let us know if there is anything that we can do to make your therapy better!   What to do if you need assistance after hours or on the weekends: CALL [phone].  HOLD on the line, do not hang up.  You will hear multiple messages but at the end you will be connected with a nurse triage line.  They will contact the doctor if necessary.  Most of the time they will be able to assist you.  Do not call the hospital operator.     I have been informed and understand all of the instructions given to me and have received a copy. I have been instructed to call the clinic [phone] or my family physician as soon as possible for continued medical  care, if indicated. I do not have any more questions at this time but understand that I may call the [facility] or the Patient Navigator at [phone] during office hours should I have questions or need assistance in obtaining follow-up care.

## 2022-07-29 ENCOUNTER — Other Ambulatory Visit: Payer: Self-pay

## 2022-07-29 DIAGNOSIS — C8590 Non-Hodgkin lymphoma, unspecified, unspecified site: Secondary | ICD-10-CM

## 2022-07-29 DIAGNOSIS — C8338 Diffuse large B-cell lymphoma, lymph nodes of multiple sites: Secondary | ICD-10-CM

## 2022-07-29 LAB — SURGICAL PATHOLOGY

## 2022-07-30 ENCOUNTER — Other Ambulatory Visit: Payer: Self-pay

## 2022-08-01 ENCOUNTER — Inpatient Hospital Stay (HOSPITAL_BASED_OUTPATIENT_CLINIC_OR_DEPARTMENT_OTHER): Payer: Managed Care, Other (non HMO) | Admitting: Hematology

## 2022-08-01 ENCOUNTER — Inpatient Hospital Stay: Payer: Managed Care, Other (non HMO) | Attending: Hematology

## 2022-08-01 ENCOUNTER — Inpatient Hospital Stay: Payer: Managed Care, Other (non HMO)

## 2022-08-01 ENCOUNTER — Ambulatory Visit: Payer: BC Managed Care – PPO | Admitting: Hematology

## 2022-08-01 VITALS — BP 140/76 | HR 68 | Temp 97.2°F | Resp 18

## 2022-08-01 DIAGNOSIS — Z95828 Presence of other vascular implants and grafts: Secondary | ICD-10-CM

## 2022-08-01 DIAGNOSIS — Z79899 Other long term (current) drug therapy: Secondary | ICD-10-CM | POA: Diagnosis not present

## 2022-08-01 DIAGNOSIS — C8590 Non-Hodgkin lymphoma, unspecified, unspecified site: Secondary | ICD-10-CM

## 2022-08-01 DIAGNOSIS — C8338 Diffuse large B-cell lymphoma, lymph nodes of multiple sites: Secondary | ICD-10-CM

## 2022-08-01 DIAGNOSIS — Z800 Family history of malignant neoplasm of digestive organs: Secondary | ICD-10-CM | POA: Diagnosis not present

## 2022-08-01 DIAGNOSIS — G479 Sleep disorder, unspecified: Secondary | ICD-10-CM | POA: Insufficient documentation

## 2022-08-01 DIAGNOSIS — C8330 Diffuse large B-cell lymphoma, unspecified site: Secondary | ICD-10-CM | POA: Insufficient documentation

## 2022-08-01 DIAGNOSIS — R110 Nausea: Secondary | ICD-10-CM | POA: Insufficient documentation

## 2022-08-01 DIAGNOSIS — Z803 Family history of malignant neoplasm of breast: Secondary | ICD-10-CM | POA: Diagnosis not present

## 2022-08-01 DIAGNOSIS — Z7952 Long term (current) use of systemic steroids: Secondary | ICD-10-CM | POA: Insufficient documentation

## 2022-08-01 DIAGNOSIS — Z5189 Encounter for other specified aftercare: Secondary | ICD-10-CM | POA: Diagnosis not present

## 2022-08-01 DIAGNOSIS — Z5111 Encounter for antineoplastic chemotherapy: Secondary | ICD-10-CM | POA: Diagnosis not present

## 2022-08-01 DIAGNOSIS — Z5112 Encounter for antineoplastic immunotherapy: Secondary | ICD-10-CM | POA: Diagnosis present

## 2022-08-01 DIAGNOSIS — K219 Gastro-esophageal reflux disease without esophagitis: Secondary | ICD-10-CM | POA: Insufficient documentation

## 2022-08-01 LAB — COMPREHENSIVE METABOLIC PANEL
ALT: 67 U/L — ABNORMAL HIGH (ref 0–44)
AST: 55 U/L — ABNORMAL HIGH (ref 15–41)
Albumin: 4.4 g/dL (ref 3.5–5.0)
Alkaline Phosphatase: 67 U/L (ref 38–126)
Anion gap: 10 (ref 5–15)
BUN: 19 mg/dL (ref 8–23)
CO2: 23 mmol/L (ref 22–32)
Calcium: 9.1 mg/dL (ref 8.9–10.3)
Chloride: 106 mmol/L (ref 98–111)
Creatinine, Ser: 0.71 mg/dL (ref 0.44–1.00)
GFR, Estimated: >60 mL/min (ref >60)
Glucose, Bld: 126 mg/dL — ABNORMAL HIGH (ref 70–99)
Potassium: 4.6 mmol/L (ref 3.5–5.1)
Sodium: 139 mmol/L (ref 135–145)
Total Bilirubin: 0.7 mg/dL (ref 0.3–1.2)
Total Protein: 7.4 g/dL (ref 6.5–8.1)

## 2022-08-01 LAB — CBC WITH DIFFERENTIAL/PLATELET
Abs Immature Granulocytes: 0.05 10*3/uL (ref 0.00–0.07)
Basophils Absolute: 0.1 10*3/uL (ref 0.0–0.1)
Basophils Relative: 1 %
Eosinophils Absolute: 0.6 10*3/uL — ABNORMAL HIGH (ref 0.0–0.5)
Eosinophils Relative: 8 %
HCT: 41.6 % (ref 36.0–46.0)
Hemoglobin: 13.8 g/dL (ref 12.0–15.0)
Immature Granulocytes: 1 %
Lymphocytes Relative: 20 %
Lymphs Abs: 1.6 10*3/uL (ref 0.7–4.0)
MCH: 28.5 pg (ref 26.0–34.0)
MCHC: 33.2 g/dL (ref 30.0–36.0)
MCV: 85.8 fL (ref 80.0–100.0)
Monocytes Absolute: 0.5 10*3/uL (ref 0.1–1.0)
Monocytes Relative: 7 %
Neutro Abs: 5.1 10*3/uL (ref 1.7–7.7)
Neutrophils Relative %: 63 %
Platelets: 287 10*3/uL (ref 150–400)
RBC: 4.85 MIL/uL (ref 3.87–5.11)
RDW: 12.9 % (ref 11.5–15.5)
WBC: 7.9 10*3/uL (ref 4.0–10.5)
nRBC: 0 % (ref 0.0–0.2)

## 2022-08-01 LAB — MAGNESIUM: Magnesium: 2 mg/dL (ref 1.7–2.4)

## 2022-08-01 MED ORDER — SODIUM CHLORIDE 0.9 % IV SOLN: Freq: Once | INTRAVENOUS | Status: AC

## 2022-08-01 MED ORDER — PREDNISONE 20 MG PO TABS: ORAL_TABLET | ORAL | 5 refills | Status: DC

## 2022-08-01 MED ORDER — PREDNISONE 50 MG PO TABS
100.0000 mg | ORAL_TABLET | Freq: Once | ORAL | Status: AC
  Administered 2022-08-01: 100 mg via ORAL
  Filled 2022-08-01: qty 2

## 2022-08-01 MED ORDER — ACETAMINOPHEN 325 MG PO TABS
650.0000 mg | ORAL_TABLET | Freq: Once | ORAL | Status: AC
  Administered 2022-08-01: 650 mg via ORAL
  Filled 2022-08-01: qty 2

## 2022-08-01 MED ORDER — PROMETHAZINE HCL 25 MG PO TABS: 25.0000 mg | ORAL_TABLET | Freq: Four times a day (QID) | ORAL | 4 refills | Status: DC | PRN

## 2022-08-01 MED ORDER — SODIUM CHLORIDE 0.9% FLUSH
10.0000 mL | INTRAVENOUS | Status: DC | PRN
  Administered 2022-08-01: 10 mL

## 2022-08-01 MED ORDER — SODIUM CHLORIDE 0.9 % IV SOLN
375.0000 mg/m2 | Freq: Once | INTRAVENOUS | Status: DC
  Filled 2022-08-01: qty 70

## 2022-08-01 MED ORDER — ALLOPURINOL 300 MG PO TABS: 300.0000 mg | ORAL_TABLET | Freq: Every day | ORAL | 3 refills | Status: DC

## 2022-08-01 MED ORDER — FAMOTIDINE IN NACL 20-0.9 MG/50ML-% IV SOLN
20.0000 mg | Freq: Once | INTRAVENOUS | Status: AC
  Administered 2022-08-01: 20 mg via INTRAVENOUS

## 2022-08-01 MED ORDER — SODIUM CHLORIDE 0.9 % IV SOLN
375.0000 mg/m2 | Freq: Once | INTRAVENOUS | Status: AC
  Administered 2022-08-01: 700 mg via INTRAVENOUS
  Filled 2022-08-01: qty 50

## 2022-08-01 MED ORDER — HEPARIN SOD (PORK) LOCK FLUSH 100 UNIT/ML IV SOLN
500.0000 [IU] | Freq: Once | INTRAVENOUS | Status: AC | PRN
  Administered 2022-08-01: 500 [IU]

## 2022-08-01 MED ORDER — DIPHENHYDRAMINE HCL 50 MG/ML IJ SOLN
50.0000 mg | Freq: Once | INTRAMUSCULAR | Status: AC
  Administered 2022-08-01: 50 mg via INTRAVENOUS
  Filled 2022-08-01: qty 1

## 2022-08-01 NOTE — Progress Notes (Signed)
Patients port flushed without difficulty.  Good blood return noted with no bruising or swelling noted at site.  Stable during access and blood draw.  Patient to remain accessed for treatment. 

## 2022-08-01 NOTE — Progress Notes (Signed)
1158- pt called out to complain of throat itching, scratchy throat. Rituxan immediately stopped. Plain normal saline hanging on its own line. Vitals stable and MD notified.   Per MD, will administer Pepcid and monitor for any other issues. Will let MD know when symptoms stop.   Per MD ok to restart Rituxan at '100mg'$ /hr -1229

## 2022-08-01 NOTE — Progress Notes (Unsigned)

## 2022-08-01 NOTE — Progress Notes (Signed)
Patient has been examined by Dr. Katragadda, and vital signs and labs have been reviewed. ANC, Creatinine, LFTs, hemoglobin, and platelets are within treatment parameters per M.D. - pt may proceed with treatment.  Primary RN and pharmacy notified.  

## 2022-08-01 NOTE — Progress Notes (Signed)
Patient presents today for [place] per providers order.  Vital signs within parameters for treatment.  Labs pending.  Patient has no new complaints at this time.  Labs reviewed by MD.  Message received from Anastasio Champion RN/Dr. Delton Coombes patient okay for treatment.  Treatment given today per MD orders.  Stable during the remainder of infusion without adverse affects.  Vital signs stable.  No complaints at this time.  Discharge from clinic ambulatory in stable condition.  Alert and oriented X 3.  Follow up with [facility] as scheduled.

## 2022-08-01 NOTE — Patient Instructions (Signed)
[facility]  Discharge Instructions: Thank you for choosing [facility] to provide your oncology and hematology care.  If you have a lab appointment with the [facility], please come in thru the Main Entrance and check in at the main information desk.  Wear comfortable clothing and clothing appropriate for easy access to any Portacath or PICC line.   We strive to give you quality time with your provider. You may need to reschedule your appointment if you arrive late (15 or more minutes).  Arriving late affects you and other patients whose appointments are after yours.  Also, if you miss three or more appointments without notifying the office, you may be dismissed from the clinic at the provider's discretion.      For prescription refill requests, have your pharmacy contact our office and allow 72 hours for refills to be completed.    Today you received the following chemotherapy and/or immunotherapy agents Rituxan      To help prevent nausea and vomiting after your treatment, we encourage you to take your nausea medication as directed.  BELOW ARE SYMPTOMS THAT SHOULD BE REPORTED IMMEDIATELY: *FEVER GREATER THAN 100.4 F (38 C) OR HIGHER *CHILLS OR SWEATING *NAUSEA AND VOMITING THAT IS NOT CONTROLLED WITH YOUR NAUSEA MEDICATION *UNUSUAL SHORTNESS OF BREATH *UNUSUAL BRUISING OR BLEEDING *URINARY PROBLEMS (pain or burning when urinating, or frequent urination) *BOWEL PROBLEMS (unusual diarrhea, constipation, pain near the anus) TENDERNESS IN MOUTH AND THROAT WITH OR WITHOUT PRESENCE OF ULCERS (sore throat, sores in mouth, or a toothache) UNUSUAL RASH, SWELLING OR PAIN  UNUSUAL VAGINAL DISCHARGE OR ITCHING   Items with * indicate a potential emergency and should be followed up as soon as possible or go to the Emergency Department if any problems should occur.  Please show the CHEMOTHERAPY ALERT CARD or IMMUNOTHERAPY ALERT CARD at check-in to the Emergency  Department and triage nurse.  Should you have questions after your visit or need to cancel or reschedule your appointment, please contact [facility] [phone]  and follow the prompts.  Office hours are 8:00 a.m. to 4:30 p.m. Monday - Friday. Please note that voicemails left after 4:00 p.m. may not be returned until the following business day.  We are closed weekends and major holidays. You have access to a nurse at all times for urgent questions. Please call the main number to the clinic [phone] and follow the prompts.  For any non-urgent questions, you may also contact your provider using MyChart. We now offer e-Visits for anyone 5 and older to request care online for non-urgent symptoms. For details visit [email or web address].   Also download the MyChart app! Go to the app store, search "MyChart", open the app, select [facility], and log in with your MyChart username and password.  Masks are optional in the cancer centers. If you would like for your care team to wear a mask while they are taking care of you, please let them know. You may have one support person who is at least 63 years old accompany you for your appointments.

## 2022-08-01 NOTE — Progress Notes (Signed)
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  PRIOR THERAPY: None  NGS Results: FISH results pending  CURRENT THERAPY: Pola-R-CHP  BRIEF ONCOLOGIC HISTORY:  Oncology History  Diffuse large B cell lymphoma ([place])  07/27/2022 Initial Diagnosis   Diffuse large B cell lymphoma ([place])   07/27/2022 Cancer Staging   Staging form: Hodgkin and Non-Hodgkin Lymphoma, AJCC 8th Edition - Clinical stage from 07/27/2022: Stage III (Diffuse large B-cell lymphoma) - Signed by Derek Jack, MD on 07/27/2022 Histopathologic type: Malignant lymphoma, large B-cell, diffuse, NOS Stage prefix: Initial diagnosis   08/01/2022 -  Chemotherapy   Patient is on Treatment Plan : NON-HODGKINS LYMPHOMA Pola-R-CHP (Polatuzumab + Rituximab -CHP) q21d       CANCER STAGING:  Cancer Staging  Diffuse large B cell lymphoma ([place]) Staging form: Hodgkin and Non-Hodgkin Lymphoma, AJCC 8th Edition - Clinical stage from 07/27/2022: Stage III (Diffuse large B-cell lymphoma) - Signed by Derek Jack, MD on 07/27/2022   INTERVAL HISTORY:  Debbie Hall 61 y.o. female seen for follow-up of generalized lymphadenopathy and lymph node biopsy.  She underwent right inguinal lymph node biopsy by Dr. Ladona Horns on 07/20/2022.  She reports energy levels are 50%.  She is also anxious.   REVIEW OF SYSTEMS:  Review of Systems  Gastrointestinal:  Positive for nausea.  Psychiatric/Behavioral:  Positive for sleep disturbance. The patient is nervous/anxious.   All other systems reviewed and are negative.    PAST MEDICAL/SURGICAL HISTORY:  Past Medical History:  Diagnosis Date   Anemia    Anxiety    Diabetes mellitus (HCC)    Fatty liver    GERD (gastroesophageal reflux disease)    Gout     Hyperlipidemia    Hypertension    Migraines    Pneumonia    as a child   Port-A-Cath in place 07/28/2022   Past Surgical History:  Procedure Laterality Date   ABDOMINAL HYSTERECTOMY     partial   CESAREAN SECTION     x 2   TONSILLECTOMY       SOCIAL HISTORY:  Social History   Socioeconomic History   Marital status: Married    Spouse name: Not on file   Number of children: 4   Years of education: Not on file   Highest education level: Not on file  Occupational History   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    Comment: [employer]  Tobacco Use   Smoking status: Never   Smokeless tobacco: Never  Vaping Use   Vaping Use: Never used  Substance and Sexual Activity   Alcohol use: Yes    Comment: occasional   Drug use: Never   Sexual activity: Not on file  Other Topics Concern   Not on file  Social History Narrative   Not on file   Social Determinants of Health   Financial Resource Strain: Not on file  Food Insecurity: Not on file  Transportation Needs: Not on file  Physical Activity: Not on file  Stress: Not on file  Social Connections: Not on file  Intimate Partner Violence: Not on file    FAMILY HISTORY:  Family History  Problem Relation Age of Onset   Hypertension Mother    Heart disease Mother    Heart attack Mother  Pancreatic cancer Mother    Prostate cancer Father    Colon cancer Neg Hx    Esophageal cancer Neg Hx    Rectal cancer Neg Hx     CURRENT MEDICATIONS:  Outpatient Encounter Medications as of 08/01/2022  Medication Sig   ALPRAZolam (XANAX) 0.5 MG tablet Take 0.5 mg by mouth 3 (three) times daily as needed for anxiety.   CYCLOPHOSPHAMIDE IV Inject into the vein every 21 ( twenty-one) days.   DOXORUBICIN HCL IV Inject into the vein every 21 ( twenty-one) days.   escitalopram (LEXAPRO) 5 MG tablet Take 1 tablet by mouth daily.   ibuprofen (ADVIL) 200 MG tablet Take 600 mg by mouth every 6 (six) hours as needed for moderate  pain.   Insulin Glargine w/ Trans Port 100 UNIT/ML SOPN Inject 10 Units into the skin daily.   levothyroxine (SYNTHROID) 50 MCG tablet Take 50 mcg by mouth daily.   metoprolol tartrate (LOPRESSOR) 50 MG tablet Take 25 mg by mouth in the morning and at bedtime.   Omega-3 Fatty Acids (FISH OIL) 1000 MG CAPS Take by mouth.   Polatuzumab Vedotin-piiq (POLIVY IV) Inject into the vein every 21 ( twenty-one) days.   promethazine (PHENERGAN) 25 MG tablet Take 1 tablet (25 mg total) by mouth every 6 (six) hours as needed for nausea or vomiting.   RITUXIMAB IV Inject into the vein every 21 ( twenty-one) days.   VITAMIN E PO Take by mouth.   [DISCONTINUED] acyclovir (ZOVIRAX) 400 MG tablet Take 1 tablet (400 mg total) by mouth daily.   [DISCONTINUED] allopurinol (ZYLOPRIM) 300 MG tablet Take 1 tablet (300 mg total) by mouth daily.   [DISCONTINUED] predniSONE (DELTASONE) 20 MG tablet Take 100 mg (5 tablets) by mouth daily with food on days 1-5 of chemotherapy   [DISCONTINUED] valACYclovir (VALTREX) 1000 MG tablet SMARTSIG:2 Tablet(s) By Mouth Every 12 Hours   allopurinol (ZYLOPRIM) 300 MG tablet Take 1 tablet (300 mg total) by mouth daily.   lidocaine-prilocaine (EMLA) cream Apply 1 Application topically as needed (Local anesthesia for port). (Patient not taking: Reported on 08/01/2022)   ondansetron (ZOFRAN) 4 MG tablet Take 4 mg by mouth every 8 (eight) hours as needed for nausea or vomiting. (Patient not taking: Reported on 08/01/2022)   predniSONE (DELTASONE) 20 MG tablet Take 100 mg (5 tablets) by mouth daily with food on days 1-5 of chemotherapy   [DISCONTINUED] prochlorperazine (COMPAZINE) 10 MG tablet Take 1 tablet (10 mg total) by mouth every 6 (six) hours as needed for nausea or vomiting. (Patient not taking: Reported on 08/01/2022)   No facility-administered encounter medications on file as of 08/01/2022.    ALLERGIES:  Allergies  Allergen Reactions   Avocado Itching   Banana Itching   Latex  Itching    Eyes watering, itching.   Lisinopril     angioedema   Penicillins     Facial swelling   Vancomycin Itching     PHYSICAL EXAM:  ECOG Performance status: 0  There were no vitals filed for this visit.  There were no vitals filed for this visit.  Physical Exam Vitals reviewed.  Constitutional:      Appearance: Normal appearance.  Cardiovascular:     Rate and Rhythm: Normal rate and regular rhythm.  Pulmonary:     Breath sounds: Normal breath sounds.  Abdominal:     Palpations: Abdomen is soft. There is no mass.  Neurological:     Mental Status: She is alert.  Psychiatric:  Mood and Affect: Mood normal.        Behavior: Behavior normal.    Right groin lymphadenopathy palpable.  LABORATORY DATA:  I have reviewed the labs as listed.  CBC    Component Value Date/Time   WBC 7.9 08/01/2022 0801   RBC 4.85 08/01/2022 0801   HGB 13.8 08/01/2022 0801   HCT 41.6 08/01/2022 0801   PLT 287 08/01/2022 0801   MCV 85.8 08/01/2022 0801   MCH 28.5 08/01/2022 0801   MCHC 33.2 08/01/2022 0801   RDW 12.9 08/01/2022 0801   LYMPHSABS 1.6 08/01/2022 0801   MONOABS 0.5 08/01/2022 0801   EOSABS 0.6 (H) 08/01/2022 0801   BASOSABS 0.1 08/01/2022 0801      Latest Ref Rng & Units 08/01/2022    8:01 AM 06/24/2022   11:42 AM 09/07/2020    2:43 PM  CMP  Glucose 70 - 99 mg/dL 126  107  122   BUN 8 - 23 mg/dL _0 Creatinine 0.44 - 1.00 mg/dL 0.71  0.81  0.77   Sodium 135 - 145 mmol/L 139  139  139   Potassium 3.5 - 5.1 mmol/L 4.6  4.8  3.6   Chloride 98 - 111 mmol/L 106  106  100   CO2 22 - 32 mmol/L _1 Calcium 8.9 - 10.3 mg/dL 9.1  9.3  10.1   Total Protein 6.5 - 8.1 g/dL 7.4  7.7  8.1   Total Bilirubin 0.3 - 1.2 mg/dL 0.7  0.8  0.7   Alkaline Phos 38 - 126 U/L 67  70  84   AST 15 - 41 U/L 55  59  113   ALT 0 - 44 U/L 67  62  176     DIAGNOSTIC IMAGING:  I have independently reviewed the scans and discussed with the  patient.  ASSESSMENT:  1.  Stage III DLBCL: - CT soft tissue neck (06/06/2022): Left supraclavicular mass 1.9 x 1.6 cm central hypodensity.  Smaller adjacent nodule present.  Oval-shaped mass anterior to the left IJ vein 2.9 x 1.8 cm.  No definite 2 exophytic oropharyngeal or hypopharyngeal mass noted.  Thyroid intact.  Lung bases clear. - Left supraclavicular lymph node biopsy by Dr. Ladona Horns on 06/10/2022 - Pathology: Atypical B-cell lymphoid infiltrate highly concerning for a lymphoproliferative disorder, possibly high-grade in the background of extensive granulomatous type inflammation and necrosis. - 2D echo (06/28/2022): LVEF 60-65%. - PET scan (07/07/2022): Markedly hypermetabolic adenopathy in the neck, chest, pelvis and inguinal regions.  Few subcentimeter pulmonary nodules are too small for PET resolution.  Hepatic steatosis. - Right inguinal lymph node biopsy (07/20/2022): Large B-cell lymphoma with Ki-67 70%.  FISH studies for high risk lymphoma sent. - IPI score-3 (age more than 81, stage III, elevated LDH) - Cycle 1 Pola-R-CHP on 08/01/2022   2.  Social/family history: - She lives at home with her husband.  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  Non-smoker.  No history of exposure to chemicals or pesticides. - Mother died of pancreatic cancer.  Father had prostate cancer.  Sister had breast cancer at age 76.  Brother had kidney cancer at age 64.    PLAN:  1.  Stage III DLBCL: - We have reviewed biopsy results of the right inguinal lymph node which showed large B-cell lymphoma. - We reviewed her 2D echocardiogram from 06/28/2022 with EF 60-65%. - She had port placed. - We reviewed further  management with 3 cycles of Pola-R-CHP followed by interim PET scan followed by 3 more cycles.  This has shown improvement in DFS compared to R-CHOP regimen in patients with high IPI score more than 2. - We discussed side effects of this regimen in detail.  Literature was provided. - We  have reviewed her labs today which showed mildly elevated AST and ALT about 1.5 times upper limit of normal from underlying fatty liver.  CBC was normal.  No dose reduction needed. - She will take prednisone 100 mg daily starting tomorrow for the next 4 days. - She will be assisting our symptom management clinic with repeat labs in 1 week.  2.  TLS prophylaxis: - Uric acid was 7.2.  Recommend allopurinol 300 mg daily. - We will check tumor lysis labs next week.  Addendum: - While she was receiving rituximab at 100 mL/h, she developed itching on the back of the throat which was going to her left ear.  We have temporarily stopped rituximab.  Vitals were stable.  We have given 20 mg of Pepcid.  After the symptoms resolved, she was started back on rituximab at the same rate.  She has tolerated rest of the infusion well.      Orders placed this encounter:  Orders Placed This Encounter  Procedures   CBC with Differential   Comprehensive metabolic panel   CBC with Differential   Comprehensive metabolic panel   CBC with Differential   Comprehensive metabolic panel   CBC with Differential   Comprehensive metabolic panel   CBC with Differential   Comprehensive metabolic panel   CBC with Differential   Comprehensive metabolic panel   Magnesium   Uric acid   Phosphorus       Derek Jack, MD [place] [phone]

## 2022-08-01 NOTE — Patient Instructions (Addendum)
[place] at [facility] Discharge Instructions   You were seen and examined today by Dr. Delton Coombes. He discussed with you the results of your biopsy, which demonstrated diffuse large B cell lymphoma.   We will treat you with a regimen called POLAR-CHP. This is an acronym for several different medications you will receive for treatment.   The most common side effect of treatment is tiredness.   If you have a fever of 100.5 or greater, you should go to the ER.   We will see you back in 1 week for a symptom management check. We will check your lab work at that time and see how you are feeling.    Thank you for choosing [place] at [facility] to provide your oncology and hematology care.  To afford each patient quality time with our provider, please arrive at least 15 minutes before your scheduled appointment time.   If you have a lab appointment with the [facility] please come in thru the Main Entrance and check in at the main information desk.  You need to re-schedule your appointment should you arrive 10 or more minutes late.  We strive to give you quality time with our providers, and arriving late affects you and other patients whose appointments are after yours.  Also, if you no show three or more times for appointments you may be dismissed from the clinic at the providers discretion.     Again, thank you for choosing [facility].  Our hope is that these requests will decrease the amount of time that you wait before being seen by our physicians.       _____________________________________________________________  Should you have questions after your visit to [facility], please contact our office at [phone] and follow the prompts.  Our office hours are 8:00 a.m. and 4:30 p.m. Monday - Friday.  Please note that voicemails left after 4:00 p.m. may not be returned until the following business day.  We are  closed weekends and major holidays.  You do have access to a nurse 24-7, just call the main number to the clinic [phone] and do not press any options, hold on the line and a nurse will answer the phone.    For prescription refill requests, have your pharmacy contact our office and allow 72 hours.    Due to Covid, you will need to wear a mask upon entering the hospital. If you do not have a mask, a mask will be given to you at the Main Entrance upon arrival. For doctor visits, patients may have 1 support person age 64 or older with them. For treatment visits, patients can not have anyone with them due to social distancing guidelines and our immunocompromised population.

## 2022-08-02 ENCOUNTER — Inpatient Hospital Stay: Payer: Managed Care, Other (non HMO)

## 2022-08-02 ENCOUNTER — Other Ambulatory Visit: Payer: Self-pay

## 2022-08-02 VITALS — BP 164/80 | HR 68 | Temp 97.0°F | Resp 18

## 2022-08-02 DIAGNOSIS — C8338 Diffuse large B-cell lymphoma, lymph nodes of multiple sites: Secondary | ICD-10-CM

## 2022-08-02 DIAGNOSIS — Z5112 Encounter for antineoplastic immunotherapy: Secondary | ICD-10-CM | POA: Diagnosis not present

## 2022-08-02 DIAGNOSIS — Z95828 Presence of other vascular implants and grafts: Secondary | ICD-10-CM

## 2022-08-02 MED ORDER — SODIUM CHLORIDE 0.9 % IV SOLN
750.0000 mg/m2 | Freq: Once | INTRAVENOUS | Status: AC
  Administered 2022-08-02: 1420 mg via INTRAVENOUS
  Filled 2022-08-02: qty 71

## 2022-08-02 MED ORDER — DOXORUBICIN HCL CHEMO IV INJECTION 2 MG/ML
50.0000 mg/m2 | Freq: Once | INTRAVENOUS | Status: AC
  Administered 2022-08-02: 94 mg via INTRAVENOUS
  Filled 2022-08-02: qty 47

## 2022-08-02 MED ORDER — FAMOTIDINE IN NACL 20-0.9 MG/50ML-% IV SOLN
20.0000 mg | Freq: Once | INTRAVENOUS | Status: AC
  Administered 2022-08-02: 20 mg via INTRAVENOUS
  Filled 2022-08-02: qty 50

## 2022-08-02 MED ORDER — SODIUM CHLORIDE 0.9 % IV SOLN
10.0000 mg | Freq: Once | INTRAVENOUS | Status: AC
  Administered 2022-08-02: 10 mg via INTRAVENOUS
  Filled 2022-08-02: qty 10

## 2022-08-02 MED ORDER — SODIUM CHLORIDE 0.9% FLUSH
10.0000 mL | INTRAVENOUS | Status: DC | PRN
  Administered 2022-08-02: 10 mL

## 2022-08-02 MED ORDER — DIPHENHYDRAMINE HCL 50 MG/ML IJ SOLN
50.0000 mg | Freq: Once | INTRAMUSCULAR | Status: AC
  Administered 2022-08-02: 50 mg via INTRAVENOUS
  Filled 2022-08-02: qty 1

## 2022-08-02 MED ORDER — SODIUM CHLORIDE 0.9 % IV SOLN
150.0000 mg | Freq: Once | INTRAVENOUS | Status: AC
  Administered 2022-08-02: 150 mg via INTRAVENOUS
  Filled 2022-08-02: qty 5

## 2022-08-02 MED ORDER — LORAZEPAM 2 MG/ML IJ SOLN
0.5000 mg | Freq: Once | INTRAMUSCULAR | Status: AC
  Administered 2022-08-02: 0.5 mg via INTRAVENOUS
  Filled 2022-08-02: qty 1

## 2022-08-02 MED ORDER — HEPARIN SOD (PORK) LOCK FLUSH 100 UNIT/ML IV SOLN
500.0000 [IU] | Freq: Once | INTRAVENOUS | Status: AC | PRN
  Administered 2022-08-02: 500 [IU]

## 2022-08-02 MED ORDER — SODIUM CHLORIDE 0.9 % IV SOLN
1.8000 mg/kg | Freq: Once | INTRAVENOUS | Status: AC
  Administered 2022-08-02: 140 mg via INTRAVENOUS
  Filled 2022-08-02: qty 7

## 2022-08-02 MED ORDER — PALONOSETRON HCL INJECTION 0.25 MG/5ML
0.2500 mg | Freq: Once | INTRAVENOUS | Status: AC
  Administered 2022-08-02: 0.25 mg via INTRAVENOUS
  Filled 2022-08-02: qty 5

## 2022-08-02 MED ORDER — SODIUM CHLORIDE 0.9 % IV SOLN: Freq: Once | INTRAVENOUS | Status: AC

## 2022-08-02 MED ORDER — ACETAMINOPHEN 325 MG PO TABS
650.0000 mg | ORAL_TABLET | Freq: Once | ORAL | Status: AC
  Administered 2022-08-02: 650 mg via ORAL
  Filled 2022-08-02: qty 2

## 2022-08-02 MED ORDER — PEGFILGRASTIM 6 MG/0.6ML ~~LOC~~ PSKT
6.0000 mg | PREFILLED_SYRINGE | Freq: Once | SUBCUTANEOUS | Status: AC
  Administered 2022-08-02: 6 mg via SUBCUTANEOUS
  Filled 2022-08-02: qty 0.6

## 2022-08-02 NOTE — Progress Notes (Signed)
Will give ativan 0.5 mg iv per MDorder for anxiety.

## 2022-08-02 NOTE — Progress Notes (Signed)
Pegfilgrastim ([id number]) is the approved GCS-F - patient would like OnPro due to long drive in from residence. Plan updated to reflect this.  Henreitta Leber, PharmD

## 2022-08-02 NOTE — Patient Instructions (Signed)
[facility]  Discharge Instructions: Thank you for choosing [facility] to provide your oncology and hematology care.  If you have a lab appointment with the [facility], please come in thru the Main Entrance and check in at the main information desk.  Wear comfortable clothing and clothing appropriate for easy access to any Portacath or PICC line.   We strive to give you quality time with your provider. You may need to reschedule your appointment if you arrive late (15 or more minutes).  Arriving late affects you and other patients whose appointments are after yours.  Also, if you miss three or more appointments without notifying the office, you may be dismissed from the clinic at the provider's discretion.      For prescription refill requests, have your pharmacy contact our office and allow 72 hours for refills to be completed.    Today you received the following chemotherapy and/or immunotherapy agents Polivy/Doxorubicin/Cytoxan      To help prevent nausea and vomiting after your treatment, we encourage you to take your nausea medication as directed.  BELOW ARE SYMPTOMS THAT SHOULD BE REPORTED IMMEDIATELY: *FEVER GREATER THAN 100.4 F (38 C) OR HIGHER *CHILLS OR SWEATING *NAUSEA AND VOMITING THAT IS NOT CONTROLLED WITH YOUR NAUSEA MEDICATION *UNUSUAL SHORTNESS OF BREATH *UNUSUAL BRUISING OR BLEEDING *URINARY PROBLEMS (pain or burning when urinating, or frequent urination) *BOWEL PROBLEMS (unusual diarrhea, constipation, pain near the anus) TENDERNESS IN MOUTH AND THROAT WITH OR WITHOUT PRESENCE OF ULCERS (sore throat, sores in mouth, or a toothache) UNUSUAL RASH, SWELLING OR PAIN  UNUSUAL VAGINAL DISCHARGE OR ITCHING   Items with * indicate a potential emergency and should be followed up as soon as possible or go to the Emergency Department if any problems should occur.  Please show the CHEMOTHERAPY ALERT CARD or IMMUNOTHERAPY ALERT CARD at  check-in to the Emergency Department and triage nurse.  Should you have questions after your visit or need to cancel or reschedule your appointment, please contact [facility] [phone]  and follow the prompts.  Office hours are 8:00 a.m. to 4:30 p.m. Monday - Friday. Please note that voicemails left after 4:00 p.m. may not be returned until the following business day.  We are closed weekends and major holidays. You have access to a nurse at all times for urgent questions. Please call the main number to the clinic [phone] and follow the prompts.  For any non-urgent questions, you may also contact your provider using MyChart. We now offer e-Visits for anyone 36 and older to request care online for non-urgent symptoms. For details visit [email or web address].   Also download the MyChart app! Go to the app store, search "MyChart", open the app, select [facility], and log in with your MyChart username and password.  Masks are optional in the cancer centers. If you would like for your care team to wear a mask while they are taking care of you, please let them know. You may have one support person who is at least 63 years old accompany you for your appointments.

## 2022-08-02 NOTE — Progress Notes (Signed)
Hypersensitivity Reaction note  Date of event:08-01-22 Time of event: 1158 Generic name of drug involved: Rituxan Name of provider notified of the hypersensitivity reaction: Dr. Delton Coombes Was agent that likely caused hypersensitivity reaction added to Allergies List within EMR? Yes Chain of events including reaction signs/symptoms, treatment administered, and outcome (e.g., drug resumed; drug discontinued; sent to Emergency Department; etc.)    5844- pt called out to complain of throat itching, scratchy throat. Rituxan immediately stopped. Plain normal saline hanging on its own line. Vitals stable and MD notified.    Per MD, will administer Pepcid and monitor for any other issues. Will let MD know when symptoms stop.    Per MD ok to restart Rituxan at '100mg'$ /hr -1229      Tyniya Kuyper, Beckie Salts, RN 08/02/2022 2:20 PM

## 2022-08-02 NOTE — Progress Notes (Signed)
Patient presents today for Polivy/Doxorubicin/Cytoxan infusions per providers order.  Vital signs within parameters for treatment.  Patients only complaint is of some nausea over night.    1317 Cytoxan finished without incident.  Patient states the burning in her nose is almost gone.  Pharmacy aware and next cycle of cytoxan will be increased to 45 minutes.  Treatment given today per MD orders.  Tolerated infusion without adverse affects.  On-Pro placed on the back of the right arm, flashing green light noted.  Vital signs stable.  No complaints at this time.  Discharge from clinic ambulatory in stable condition.  Alert and oriented X 3.  Follow up with [facility] as scheduled.

## 2022-08-02 NOTE — Progress Notes (Signed)
1300 patient called out reporting burning/itching in her nose.  Cytoxan stopped and MD notified.  MD wants to restart Cytoxan after burning/itching improves.

## 2022-08-03 ENCOUNTER — Telehealth: Payer: Self-pay

## 2022-08-03 ENCOUNTER — Inpatient Hospital Stay: Payer: Managed Care, Other (non HMO)

## 2022-08-03 NOTE — Telephone Encounter (Signed)
Patient called back and she now has a rash on face.  Does not itch and is not painful.

## 2022-08-03 NOTE — Telephone Encounter (Signed)
Patient called for 24-hr follow-up call following treatment on 11/6 & 11/7. Patient reports she called the [facility] Service line early this morning d/t chest tightness, HR in 120s, BP of 182/90, and nausea. Patient was advised to call 911. EMS assessed patient, BP had gone down, Blood sugar was 157. Patient did not go to emergency room. Patient called service line again to report shaking and anxiety, she had already taken Xanax. Patient advised to follow-up with PCP.   Patient reports to [facility] RN the symptoms that occurred this morning and states she is feeling better, she took phenergan which helped some but states she does have some nausea that is manageable. She states she feels like hypertension, tachycardia and shakiness that occurred this morning were due to anxiety. Patient reports having an appointment with her PCP today to adjust some of her medications. Patient advised to call [facility] if other symptoms arise and patient made aware of symptom management visit with Dr. Delton Coombes on Monday, patient verbalizes understanding.

## 2022-08-04 ENCOUNTER — Ambulatory Visit: Payer: Managed Care, Other (non HMO)

## 2022-08-04 ENCOUNTER — Encounter: Payer: Self-pay | Admitting: Hematology

## 2022-08-05 ENCOUNTER — Other Ambulatory Visit: Payer: Self-pay

## 2022-08-06 ENCOUNTER — Encounter: Payer: Self-pay | Admitting: Hematology

## 2022-08-08 ENCOUNTER — Inpatient Hospital Stay: Payer: Managed Care, Other (non HMO)

## 2022-08-08 ENCOUNTER — Inpatient Hospital Stay: Payer: Managed Care, Other (non HMO) | Admitting: Hematology

## 2022-08-08 DIAGNOSIS — C8338 Diffuse large B-cell lymphoma, lymph nodes of multiple sites: Secondary | ICD-10-CM | POA: Diagnosis not present

## 2022-08-08 DIAGNOSIS — Z5112 Encounter for antineoplastic immunotherapy: Secondary | ICD-10-CM | POA: Diagnosis not present

## 2022-08-08 LAB — COMPREHENSIVE METABOLIC PANEL
ALT: 71 U/L — ABNORMAL HIGH (ref 0–44)
AST: 40 U/L (ref 15–41)
Albumin: 3.8 g/dL (ref 3.5–5.0)
Alkaline Phosphatase: 53 U/L (ref 38–126)
Anion gap: 7 (ref 5–15)
BUN: 18 mg/dL (ref 8–23)
CO2: 23 mmol/L (ref 22–32)
Calcium: 8.8 mg/dL — ABNORMAL LOW (ref 8.9–10.3)
Chloride: 107 mmol/L (ref 98–111)
Creatinine, Ser: 0.61 mg/dL (ref 0.44–1.00)
GFR, Estimated: >60 mL/min (ref >60)
Glucose, Bld: 125 mg/dL — ABNORMAL HIGH (ref 70–99)
Potassium: 4.1 mmol/L (ref 3.5–5.1)
Sodium: 137 mmol/L (ref 135–145)
Total Bilirubin: 1.1 mg/dL (ref 0.3–1.2)
Total Protein: 6.5 g/dL (ref 6.5–8.1)

## 2022-08-08 LAB — CBC WITH DIFFERENTIAL/PLATELET
Abs Immature Granulocytes: 0 10*3/uL (ref 0.00–0.07)
Basophils Absolute: 0 10*3/uL (ref 0.0–0.1)
Basophils Relative: 6 %
Eosinophils Absolute: 0.1 10*3/uL (ref 0.0–0.5)
Eosinophils Relative: 12 %
HCT: 38.6 % (ref 36.0–46.0)
Hemoglobin: 12.8 g/dL (ref 12.0–15.0)
Immature Granulocytes: 0 %
Lymphocytes Relative: 31 %
Lymphs Abs: 0.2 10*3/uL — ABNORMAL LOW (ref 0.7–4.0)
MCH: 28.6 pg (ref 26.0–34.0)
MCHC: 33.2 g/dL (ref 30.0–36.0)
MCV: 86.4 fL (ref 80.0–100.0)
Monocytes Absolute: 0 10*3/uL — ABNORMAL LOW (ref 0.1–1.0)
Monocytes Relative: 4 %
Neutro Abs: 0.2 10*3/uL — CL (ref 1.7–7.7)
Neutrophils Relative %: 47 %
Platelets: 120 10*3/uL — ABNORMAL LOW (ref 150–400)
RBC: 4.47 MIL/uL (ref 3.87–5.11)
RDW: 12.4 % (ref 11.5–15.5)
WBC: 0.5 10*3/uL — CL (ref 4.0–10.5)
nRBC: 0 % (ref 0.0–0.2)

## 2022-08-08 LAB — URIC ACID: Uric Acid, Serum: 3.1 mg/dL (ref 2.5–7.1)

## 2022-08-08 LAB — MAGNESIUM: Magnesium: 2.1 mg/dL (ref 1.7–2.4)

## 2022-08-08 LAB — PHOSPHORUS: Phosphorus: 2.9 mg/dL (ref 2.5–4.6)

## 2022-08-08 MED ORDER — SODIUM CHLORIDE 0.9% FLUSH
10.0000 mL | Freq: Once | INTRAVENOUS | Status: AC
  Administered 2022-08-08: 10 mL via INTRAVENOUS

## 2022-08-08 MED ORDER — PANTOPRAZOLE SODIUM 40 MG PO TBEC: 40.0000 mg | DELAYED_RELEASE_TABLET | Freq: Every day | ORAL | 4 refills | Status: DC

## 2022-08-08 MED ORDER — HEPARIN SOD (PORK) LOCK FLUSH 100 UNIT/ML IV SOLN
500.0000 [IU] | Freq: Once | INTRAVENOUS | Status: AC
  Administered 2022-08-08: 500 [IU] via INTRAVENOUS

## 2022-08-08 NOTE — Addendum Note (Signed)
Addended by: Donnie Aho on: 08/08/2022 10:58 AM   Modules accepted: Orders

## 2022-08-08 NOTE — Patient Instructions (Addendum)
[place] at [facility] Discharge Instructions   You were seen and examined today by Dr. Delton Coombes.  He reviewed your lab work. Your white blood cells and neutrophils are severely low. All other lab work is normal.   We will send Protonix for you to take for heartburn.   Take Phenergan for nausea for two days after treatments. All other times you may use Zofran to help control your nausea.   We will see you back in 2 weeks with your next treatment.      Thank you for choosing [place] at [facility] to provide your oncology and hematology care.  To afford each patient quality time with our provider, please arrive at least 15 minutes before your scheduled appointment time.   If you have a lab appointment with the [facility] please come in thru the Main Entrance and check in at the main information desk.  You need to re-schedule your appointment should you arrive 10 or more minutes late.  We strive to give you quality time with our providers, and arriving late affects you and other patients whose appointments are after yours.  Also, if you no show three or more times for appointments you may be dismissed from the clinic at the providers discretion.     Again, thank you for choosing [facility].  Our hope is that these requests will decrease the amount of time that you wait before being seen by our physicians.       _____________________________________________________________  Should you have questions after your visit to [facility], please contact our office at [phone] and follow the prompts.  Our office hours are 8:00 a.m. and 4:30 p.m. Monday - Friday.  Please note that voicemails left after 4:00 p.m. may not be returned until the following business day.  We are closed weekends and major holidays.  You do have access to a nurse 24-7, just call the main number to the clinic [phone] and do not press  any options, hold on the line and a nurse will answer the phone.    For prescription refill requests, have your pharmacy contact our office and allow 72 hours.    Due to Covid, you will need to wear a mask upon entering the hospital. If you do not have a mask, a mask will be given to you at the Main Entrance upon arrival. For doctor visits, patients may have 1 support person age 74 or older with them. For treatment visits, patients can not have anyone with them due to social distancing guidelines and our immunocompromised population.

## 2022-08-08 NOTE — Addendum Note (Signed)
Addended by: Jerson Furukawa, Coralie Keens on: 08/08/2022 11:06 AM   Modules accepted: Orders

## 2022-08-08 NOTE — Progress Notes (Signed)
CRITICAL VALUE ALERT Critical value received:  WBC 0.5 and ANC 0.2 Date of notification:  08-08-2022 Time of notification: 09:56 am.  Critical value read back:  Yes.   Nurse who received alert:  B.Essence Merle RN MD notified time and response:  [place] @ 10:10 am.

## 2022-08-08 NOTE — Progress Notes (Signed)
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  PRIOR THERAPY: None  NGS Results: FISH results pending  CURRENT THERAPY: Pola-R-CHP  BRIEF ONCOLOGIC HISTORY:  Oncology History  Diffuse large B cell lymphoma ([place])  07/27/2022 Initial Diagnosis   Diffuse large B cell lymphoma ([place])   07/27/2022 Cancer Staging   Staging form: Hodgkin and Non-Hodgkin Lymphoma, AJCC 8th Edition - Clinical stage from 07/27/2022: Stage III (Diffuse large B-cell lymphoma) - Signed by Derek Jack, MD on 07/27/2022 Histopathologic type: Malignant lymphoma, large B-cell, diffuse, NOS Stage prefix: Initial diagnosis   08/01/2022 -  Chemotherapy   Patient is on Treatment Plan : NON-HODGKINS LYMPHOMA Pola-R-CHP (Polatuzumab + Rituximab -CHP) q21d       CANCER STAGING:  Cancer Staging  Diffuse large B cell lymphoma ([place]) Staging form: Hodgkin and Non-Hodgkin Lymphoma, AJCC 8th Edition - Clinical stage from 07/27/2022: Stage III (Diffuse large B-cell lymphoma) - Signed by Derek Jack, MD on 07/27/2022   INTERVAL HISTORY:  Debbie Hall 61 y.o. female seen for follow-up and toxicity assessment after first cycle of chemotherapy.  She received first cycle on 08/02/2022.  She reported nausea which is constant but denied any vomiting.  She also reported worsening of her heartburn.  Tiredness is also improving in the last 1 to 2 days.   REVIEW OF SYSTEMS:  Review of Systems  Gastrointestinal:  Positive for nausea.  Psychiatric/Behavioral:  Positive for sleep disturbance. The patient is nervous/anxious.   All other systems reviewed and are negative.    PAST MEDICAL/SURGICAL HISTORY:  Past Medical History:  Diagnosis Date   Anemia    Anxiety    Diabetes mellitus (HCC)     Fatty liver    GERD (gastroesophageal reflux disease)    Gout    Hyperlipidemia    Hypertension    Migraines    Pneumonia    as a child   Port-A-Cath in place 07/28/2022   Past Surgical History:  Procedure Laterality Date   ABDOMINAL HYSTERECTOMY     partial   CESAREAN SECTION     x 2   TONSILLECTOMY       SOCIAL HISTORY:  Social History   Socioeconomic History   Marital status: Married    Spouse name: Not on file   Number of children: 4   Years of education: Not on file   Highest education level: Not on file  Occupational History   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    Comment: [employer]  Tobacco Use   Smoking status: Never   Smokeless tobacco: Never  Vaping Use   Vaping Use: Never used  Substance and Sexual Activity   Alcohol use: Yes    Comment: occasional   Drug use: Never   Sexual activity: Not on file  Other Topics Concern   Not on file  Social History Narrative   Not on file   Social Determinants of Health   Financial Resource Strain: Not on file  Food Insecurity: Not on file  Transportation Needs: Not on file  Physical Activity: Not on file  Stress: Not on file  Social Connections: Not on file  Intimate Partner Violence: Not on file    FAMILY HISTORY:  Family History  Problem Relation Age of Onset   Hypertension Mother  Heart disease Mother    Heart attack Mother    Pancreatic cancer Mother    Prostate cancer Father    Colon cancer Neg Hx    Esophageal cancer Neg Hx    Rectal cancer Neg Hx     CURRENT MEDICATIONS:  Outpatient Encounter Medications as of 08/08/2022  Medication Sig   allopurinol (ZYLOPRIM) 300 MG tablet Take 1 tablet (300 mg total) by mouth daily.   ALPRAZolam (XANAX) 0.5 MG tablet Take 0.5 mg by mouth 3 (three) times daily as needed for anxiety.   CYCLOPHOSPHAMIDE IV Inject into the vein every 21 ( twenty-one) days.   DOXORUBICIN HCL IV Inject into the vein every 21 ( twenty-one) days.    escitalopram (LEXAPRO) 5 MG tablet Take 1 tablet by mouth daily.   ibuprofen (ADVIL) 200 MG tablet Take 600 mg by mouth every 6 (six) hours as needed for moderate pain.   Insulin Glargine w/ Trans Port 100 UNIT/ML SOPN Inject 10 Units into the skin daily.   levothyroxine (SYNTHROID) 50 MCG tablet Take 50 mcg by mouth daily.   lidocaine-prilocaine (EMLA) cream Apply 1 Application topically as needed (Local anesthesia for port).   metoprolol tartrate (LOPRESSOR) 50 MG tablet Take 25 mg by mouth in the morning and at bedtime.   Omega-3 Fatty Acids (FISH OIL) 1000 MG CAPS Take by mouth.   ondansetron (ZOFRAN) 4 MG tablet Take 4 mg by mouth every 8 (eight) hours as needed for nausea or vomiting.   Polatuzumab Vedotin-piiq (POLIVY IV) Inject into the vein every 21 ( twenty-one) days.   predniSONE (DELTASONE) 20 MG tablet Take 100 mg (5 tablets) by mouth daily with food on days 1-5 of chemotherapy   promethazine (PHENERGAN) 25 MG tablet Take 1 tablet (25 mg total) by mouth every 6 (six) hours as needed for nausea or vomiting.   RITUXIMAB IV Inject into the vein every 21 ( twenty-one) days.   VITAMIN E PO Take by mouth.   No facility-administered encounter medications on file as of 08/08/2022.    ALLERGIES:  Allergies  Allergen Reactions   Avocado Itching   Banana Itching   Latex Itching    Eyes watering, itching.   Lisinopril     angioedema   Penicillins     Facial swelling   Rituxan [Rituximab]     Itching in her throat    Vancomycin Itching     PHYSICAL EXAM:  ECOG Performance status: 0  There were no vitals filed for this visit.  There were no vitals filed for this visit.  Physical Exam Vitals reviewed.  Constitutional:      Appearance: Normal appearance.  Cardiovascular:     Rate and Rhythm: Normal rate and regular rhythm.  Pulmonary:     Breath sounds: Normal breath sounds.  Abdominal:     Palpations: Abdomen is soft. There is no mass.  Neurological:     Mental  Status: She is alert.  Psychiatric:        Mood and Affect: Mood normal.        Behavior: Behavior normal.   Right groin lymphadenopathy palpable.  LABORATORY DATA:  I have reviewed the labs as listed.  CBC    Component Value Date/Time   WBC 7.9 08/01/2022 0801   RBC 4.85 08/01/2022 0801   HGB 13.8 08/01/2022 0801   HCT 41.6 08/01/2022 0801   PLT 287 08/01/2022 0801   MCV 85.8 08/01/2022 0801   MCH 28.5 08/01/2022 0801   MCHC 33.2  08/01/2022 0801   RDW 12.9 08/01/2022 0801   LYMPHSABS 1.6 08/01/2022 0801   MONOABS 0.5 08/01/2022 0801   EOSABS 0.6 (H) 08/01/2022 0801   BASOSABS 0.1 08/01/2022 0801      Latest Ref Rng & Units 08/01/2022    8:01 AM 06/24/2022   11:42 AM 09/07/2020    2:43 PM  CMP  Glucose 70 - 99 mg/dL 126  107  122   BUN 8 - 23 mg/dL _0 Creatinine 0.44 - 1.00 mg/dL 0.71  0.81  0.77   Sodium 135 - 145 mmol/L 139  139  139   Potassium 3.5 - 5.1 mmol/L 4.6  4.8  3.6   Chloride 98 - 111 mmol/L 106  106  100   CO2 22 - 32 mmol/L _1 Calcium 8.9 - 10.3 mg/dL 9.1  9.3  10.1   Total Protein 6.5 - 8.1 g/dL 7.4  7.7  8.1   Total Bilirubin 0.3 - 1.2 mg/dL 0.7  0.8  0.7   Alkaline Phos 38 - 126 U/L 67  70  84   AST 15 - 41 U/L 55  59  113   ALT 0 - 44 U/L 67  62  176     DIAGNOSTIC IMAGING:  I have independently reviewed the scans and discussed with the patient.  ASSESSMENT:  1.  Stage III DLBCL: - CT soft tissue neck (06/06/2022): Left supraclavicular mass 1.9 x 1.6 cm central hypodensity.  Smaller adjacent nodule present.  Oval-shaped mass anterior to the left IJ vein 2.9 x 1.8 cm.  No definite 2 exophytic oropharyngeal or hypopharyngeal mass noted.  Thyroid intact.  Lung bases clear. - Left supraclavicular lymph node biopsy by Dr. Ladona Horns on 06/10/2022 - Pathology: Atypical B-cell lymphoid infiltrate highly concerning for a lymphoproliferative disorder, possibly high-grade in the background of extensive granulomatous type inflammation and  necrosis. - 2D echo (06/28/2022): LVEF 60-65%. - PET scan (07/07/2022): Markedly hypermetabolic adenopathy in the neck, chest, pelvis and inguinal regions.  Few subcentimeter pulmonary nodules are too small for PET resolution.  Hepatic steatosis. - Right inguinal lymph node biopsy (07/20/2022): Large B-cell lymphoma with Ki-67 70%.  FISH studies for high risk lymphoma sent. - IPI score-3 (age more than 80, stage III, elevated LDH) - 2D echo (06/28/2022): EF 60 to 65%. - Cycle 1 Pola-R-CHP on 08/01/2022   2.  Social/family history: - She lives at home with her husband.  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  Non-smoker.  No history of exposure to chemicals or pesticides. - Mother died of pancreatic cancer.  Father had prostate cancer.  Sister had breast cancer at age 62.  Brother had kidney cancer at age 62.    PLAN:  1.  Stage III DLBCL: - Cycle 1 of chemotherapy on 08/02/2022. - She had a reaction while receiving rituximab with itching in the back of the throat radiating to her left ear.  Improved with temporarily stopping and giving Pepcid. - Her left neck lymph node has already decreased in size. - Reviewed labs today which showed white count is 0.5 with PLT 120.  Hemoglobin is normal.  ANC is low at 0.2.  LFTs show ALT elevated at 71. - She already received Neulasta on pro. - RTC 2 weeks for follow-up with labs and cycle 2.   2.  TLS prophylaxis: - Tumor lysis labs including uric acid, phosphate, magnesium and potassium were normal. - Continue  allopurinol 300 mg daily.  3.  Acid reflux: - This is exacerbated after chemotherapy. - We will start her on Protonix 40 mg daily.  4.  Nausea: - Phenergan makes her sleepy.  No vomiting reported.  She had hyperemesis gravidarum during her pregnancy. - She was instructed to take Zofran 8 mg 1 tablet in the morning daily and use every 8 hours as needed.    Orders placed this encounter:  No orders of the defined types were placed in  this encounter.      Derek Jack, MD [place] [phone]

## 2022-08-09 ENCOUNTER — Encounter: Payer: Self-pay | Admitting: Hematology

## 2022-08-10 ENCOUNTER — Other Ambulatory Visit: Payer: Self-pay | Admitting: *Deleted

## 2022-08-10 MED ORDER — SUCRALFATE 1 GM/10ML PO SUSP: ORAL | 1 refills | Status: DC

## 2022-08-10 MED ORDER — LIDOCAINE VISCOUS HCL 2 % MT SOLN: OROMUCOSAL | 1 refills | Status: DC

## 2022-08-10 NOTE — Progress Notes (Signed)
Patient called to advise that ulcers and pain in mouth and throat have worsened since last treatment.  Prescription sent in for Carafate and 2% lidocaine viscous 18m 1:1 to swish and swallow QID.  Made her aware to contact [place] this regimen is not effective.

## 2022-08-16 ENCOUNTER — Encounter: Payer: Self-pay | Admitting: Hematology

## 2022-08-18 ENCOUNTER — Other Ambulatory Visit: Payer: Self-pay

## 2022-08-22 NOTE — Progress Notes (Signed)
The following biosimilar Ruxience (rituximab-pvvr) has been selected for use in this patient.   Please ensure she receives Pepcid 20 mg IVPB prior to Ruxience.  Do not give rapid due to infusion reaction with first dose of Ruxience.  Henreitta Leber, PharmD

## 2022-08-23 ENCOUNTER — Inpatient Hospital Stay: Payer: Managed Care, Other (non HMO)

## 2022-08-23 ENCOUNTER — Inpatient Hospital Stay (HOSPITAL_BASED_OUTPATIENT_CLINIC_OR_DEPARTMENT_OTHER): Payer: Managed Care, Other (non HMO) | Admitting: Hematology

## 2022-08-23 VITALS — BP 153/82 | HR 64 | Temp 97.9°F | Resp 18

## 2022-08-23 DIAGNOSIS — Z95828 Presence of other vascular implants and grafts: Secondary | ICD-10-CM | POA: Diagnosis not present

## 2022-08-23 DIAGNOSIS — C8338 Diffuse large B-cell lymphoma, lymph nodes of multiple sites: Secondary | ICD-10-CM | POA: Diagnosis not present

## 2022-08-23 DIAGNOSIS — Z5112 Encounter for antineoplastic immunotherapy: Secondary | ICD-10-CM | POA: Diagnosis not present

## 2022-08-23 DIAGNOSIS — C8590 Non-Hodgkin lymphoma, unspecified, unspecified site: Secondary | ICD-10-CM

## 2022-08-23 LAB — CBC WITH DIFFERENTIAL/PLATELET
Abs Immature Granulocytes: 0.12 10*3/uL — ABNORMAL HIGH (ref 0.00–0.07)
Basophils Absolute: 0.2 10*3/uL — ABNORMAL HIGH (ref 0.0–0.1)
Basophils Relative: 2 %
Eosinophils Absolute: 0.1 10*3/uL (ref 0.0–0.5)
Eosinophils Relative: 1 %
HCT: 37.5 % (ref 36.0–46.0)
Hemoglobin: 12.3 g/dL (ref 12.0–15.0)
Immature Granulocytes: 2 %
Lymphocytes Relative: 8 %
Lymphs Abs: 0.5 10*3/uL — ABNORMAL LOW (ref 0.7–4.0)
MCH: 28.5 pg (ref 26.0–34.0)
MCHC: 32.8 g/dL (ref 30.0–36.0)
MCV: 87 fL (ref 80.0–100.0)
Monocytes Absolute: 0.4 10*3/uL (ref 0.1–1.0)
Monocytes Relative: 5 %
Neutro Abs: 5.5 10*3/uL (ref 1.7–7.7)
Neutrophils Relative %: 82 %
Platelets: 392 10*3/uL (ref 150–400)
RBC: 4.31 MIL/uL (ref 3.87–5.11)
RDW: 13.3 % (ref 11.5–15.5)
WBC: 6.8 10*3/uL (ref 4.0–10.5)
nRBC: 0 % (ref 0.0–0.2)

## 2022-08-23 LAB — COMPREHENSIVE METABOLIC PANEL
ALT: 67 U/L — ABNORMAL HIGH (ref 0–44)
AST: 68 U/L — ABNORMAL HIGH (ref 15–41)
Albumin: 4 g/dL (ref 3.5–5.0)
Alkaline Phosphatase: 64 U/L (ref 38–126)
Anion gap: 7 (ref 5–15)
BUN: 18 mg/dL (ref 8–23)
CO2: 24 mmol/L (ref 22–32)
Calcium: 8.7 mg/dL — ABNORMAL LOW (ref 8.9–10.3)
Chloride: 106 mmol/L (ref 98–111)
Creatinine, Ser: 0.92 mg/dL (ref 0.44–1.00)
GFR, Estimated: >60 mL/min (ref >60)
Glucose, Bld: 133 mg/dL — ABNORMAL HIGH (ref 70–99)
Potassium: 4.5 mmol/L (ref 3.5–5.1)
Sodium: 137 mmol/L (ref 135–145)
Total Bilirubin: 0.3 mg/dL (ref 0.3–1.2)
Total Protein: 6.5 g/dL (ref 6.5–8.1)

## 2022-08-23 LAB — MAGNESIUM: Magnesium: 2.1 mg/dL (ref 1.7–2.4)

## 2022-08-23 LAB — LACTATE DEHYDROGENASE: LDH: 200 U/L — ABNORMAL HIGH (ref 98–192)

## 2022-08-23 LAB — URIC ACID: Uric Acid, Serum: 6.1 mg/dL (ref 2.5–7.1)

## 2022-08-23 MED ORDER — HEPARIN SOD (PORK) LOCK FLUSH 100 UNIT/ML IV SOLN
500.0000 [IU] | Freq: Once | INTRAVENOUS | Status: AC | PRN
  Administered 2022-08-23: 500 [IU]

## 2022-08-23 MED ORDER — SODIUM CHLORIDE 0.9 % IV SOLN
10.0000 mg | Freq: Once | INTRAVENOUS | Status: AC
  Administered 2022-08-23: 10 mg via INTRAVENOUS
  Filled 2022-08-23: qty 10

## 2022-08-23 MED ORDER — SODIUM CHLORIDE 0.9 % IV SOLN
750.0000 mg/m2 | Freq: Once | INTRAVENOUS | Status: AC
  Administered 2022-08-23: 1420 mg via INTRAVENOUS
  Filled 2022-08-23: qty 71

## 2022-08-23 MED ORDER — SODIUM CHLORIDE 0.9% FLUSH
10.0000 mL | INTRAVENOUS | Status: DC | PRN
  Administered 2022-08-23: 10 mL via INTRAVENOUS

## 2022-08-23 MED ORDER — DOXORUBICIN HCL CHEMO IV INJECTION 2 MG/ML
50.0000 mg/m2 | Freq: Once | INTRAVENOUS | Status: AC
  Administered 2022-08-23: 94 mg via INTRAVENOUS
  Filled 2022-08-23: qty 47

## 2022-08-23 MED ORDER — PEGFILGRASTIM 6 MG/0.6ML ~~LOC~~ PSKT
6.0000 mg | PREFILLED_SYRINGE | Freq: Once | SUBCUTANEOUS | Status: AC
  Administered 2022-08-23: 6 mg via SUBCUTANEOUS
  Filled 2022-08-23: qty 0.6

## 2022-08-23 MED ORDER — PALONOSETRON HCL INJECTION 0.25 MG/5ML
0.2500 mg | Freq: Once | INTRAVENOUS | Status: AC
  Administered 2022-08-23: 0.25 mg via INTRAVENOUS
  Filled 2022-08-23: qty 5

## 2022-08-23 MED ORDER — SODIUM CHLORIDE 0.9 % IV SOLN
150.0000 mg | Freq: Once | INTRAVENOUS | Status: AC
  Administered 2022-08-23: 150 mg via INTRAVENOUS
  Filled 2022-08-23: qty 150

## 2022-08-23 MED ORDER — FAMOTIDINE IN NACL 20-0.9 MG/50ML-% IV SOLN
20.0000 mg | Freq: Once | INTRAVENOUS | Status: AC
  Administered 2022-08-23: 20 mg via INTRAVENOUS
  Filled 2022-08-23: qty 50

## 2022-08-23 MED ORDER — DIPHENHYDRAMINE HCL 50 MG/ML IJ SOLN
25.0000 mg | Freq: Once | INTRAMUSCULAR | Status: AC
  Administered 2022-08-23: 25 mg via INTRAVENOUS
  Filled 2022-08-23: qty 1

## 2022-08-23 MED ORDER — SODIUM CHLORIDE 0.9% FLUSH
10.0000 mL | INTRAVENOUS | Status: DC | PRN
  Administered 2022-08-23: 10 mL

## 2022-08-23 MED ORDER — ACETAMINOPHEN 325 MG PO TABS
650.0000 mg | ORAL_TABLET | Freq: Once | ORAL | Status: AC
  Administered 2022-08-23: 650 mg via ORAL
  Filled 2022-08-23: qty 2

## 2022-08-23 MED ORDER — SODIUM CHLORIDE 0.9 % IV SOLN
375.0000 mg/m2 | Freq: Once | INTRAVENOUS | Status: AC
  Administered 2022-08-23: 700 mg via INTRAVENOUS
  Filled 2022-08-23: qty 50

## 2022-08-23 MED ORDER — SODIUM CHLORIDE 0.9 % IV SOLN
1.8000 mg/kg | Freq: Once | INTRAVENOUS | Status: AC
  Administered 2022-08-23: 140 mg via INTRAVENOUS
  Filled 2022-08-23: qty 7

## 2022-08-23 MED ORDER — SODIUM CHLORIDE 0.9 % IV SOLN: Freq: Once | INTRAVENOUS | Status: AC

## 2022-08-23 NOTE — Progress Notes (Signed)
Pt presents today for Ruxience, Polivy, Doxorubicin, Cytoxan, and OnPro per provider's order. Vital signs and labs WNL for treatment today. Okay to proceed with treatment per Dr.K.  Treatment given today per MD orders. Tolerated infusion without adverse affects. Vital signs stable. No complaints at this time. Discharged from clinic ambulatory in stable condition. Alert and oriented x 3. F/U with [facility] as scheduled.

## 2022-08-23 NOTE — Progress Notes (Signed)
Patients port flushed without difficulty.  Good blood return noted with no bruising or swelling noted at site.  Patient remains accessed for chemotherapy treatment.  

## 2022-08-23 NOTE — Progress Notes (Signed)
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  PRIOR THERAPY: None  NGS Results: FISH results pending  CURRENT THERAPY: Pola-R-CHP  BRIEF ONCOLOGIC HISTORY:  Oncology History  Diffuse large B cell lymphoma ([place])  07/27/2022 Initial Diagnosis   Diffuse large B cell lymphoma ([place])   07/27/2022 Cancer Staging   Staging form: Hodgkin and Non-Hodgkin Lymphoma, AJCC 8th Edition - Clinical stage from 07/27/2022: Stage III (Diffuse large B-cell lymphoma) - Signed by Derek Jack, MD on 07/27/2022 Histopathologic type: Malignant lymphoma, large B-cell, diffuse, NOS Stage prefix: Initial diagnosis   08/01/2022 -  Chemotherapy   Patient is on Treatment Plan : NON-HODGKINS LYMPHOMA Pola-R-CHP (Polatuzumab + Rituximab -CHP) q21d       CANCER STAGING:  Cancer Staging  Diffuse large B cell lymphoma ([place]) Staging form: Hodgkin and Non-Hodgkin Lymphoma, AJCC 8th Edition - Clinical stage from 07/27/2022: Stage III (Diffuse large B-cell lymphoma) - Signed by Derek Jack, MD on 07/27/2022   INTERVAL HISTORY:  Debbie Hall 61 y.o. female seen for follow-up and toxicity assessment prior to cycle 2 of immunotherapy and chemotherapy.  She is reporting energy levels of 60%.  On and off nausea which is well-controlled with Phenergan/Zofran.  REVIEW OF SYSTEMS:  Review of Systems  Gastrointestinal:  Positive for nausea.  Psychiatric/Behavioral:  Positive for sleep disturbance.   All other systems reviewed and are negative.    PAST MEDICAL/SURGICAL HISTORY:  Past Medical History:  Diagnosis Date   Anemia    Anxiety    Diabetes mellitus (HCC)    Fatty liver    GERD (gastroesophageal reflux disease)    Gout    Hyperlipidemia    Hypertension    Migraines     Pneumonia    as a child   Port-A-Cath in place 07/28/2022   Past Surgical History:  Procedure Laterality Date   ABDOMINAL HYSTERECTOMY     partial   CESAREAN SECTION     x 2   TONSILLECTOMY       SOCIAL HISTORY:  Social History   Socioeconomic History   Marital status: Married    Spouse name: Not on file   Number of children: 4   Years of education: Not on file   Highest education level: Not on file  Occupational History   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    Comment: [employer]  Tobacco Use   Smoking status: Never   Smokeless tobacco: Never  Vaping Use   Vaping Use: Never used  Substance and Sexual Activity   Alcohol use: Yes    Comment: occasional   Drug use: Never   Sexual activity: Not on file  Other Topics Concern   Not on file  Social History Narrative   Not on file   Social Determinants of Health   Financial Resource Strain: Not on file  Food Insecurity: Not on file  Transportation Needs: Not on file  Physical Activity: Not on file  Stress: Not on file  Social Connections: Not on file  Intimate Partner Violence: Not on file    FAMILY HISTORY:  Family History  Problem Relation Age of Onset   Hypertension Mother    Heart disease Mother    Heart attack Mother    Pancreatic cancer Mother    Prostate  cancer Father    Colon cancer Neg Hx    Esophageal cancer Neg Hx    Rectal cancer Neg Hx     CURRENT MEDICATIONS:  Outpatient Encounter Medications as of 08/23/2022  Medication Sig   allopurinol (ZYLOPRIM) 300 MG tablet Take 1 tablet (300 mg total) by mouth daily.   ALPRAZolam (XANAX) 1 MG tablet Take 1 mg by mouth 2 (two) times daily.   CYCLOPHOSPHAMIDE IV Inject into the vein every 21 ( twenty-one) days.   DOXORUBICIN HCL IV Inject into the vein every 21 ( twenty-one) days.   escitalopram (LEXAPRO) 5 MG tablet Take 1 tablet by mouth daily.   ibuprofen (ADVIL) 200 MG tablet Take 600 mg by mouth every 6 (six) hours as needed for  moderate pain.   Insulin Glargine w/ Trans Port 100 UNIT/ML SOPN Inject 10 Units into the skin daily.   levothyroxine (SYNTHROID) 50 MCG tablet Take 50 mcg by mouth daily.   lidocaine (XYLOCAINE) 2 % solution Mix 10 ml with 10 ml of Carafate an swish and swallow 4 times daily   lidocaine-prilocaine (EMLA) cream Apply 1 Application topically as needed (Local anesthesia for port).   metoprolol tartrate (LOPRESSOR) 50 MG tablet Take 25 mg by mouth in the morning and at bedtime.   Omega-3 Fatty Acids (FISH OIL) 1000 MG CAPS Take by mouth.   ondansetron (ZOFRAN) 4 MG tablet Take 4 mg by mouth every 8 (eight) hours as needed for nausea or vomiting.   pantoprazole (PROTONIX) 40 MG tablet Take 1 tablet (40 mg total) by mouth daily.   Polatuzumab Vedotin-piiq (POLIVY IV) Inject into the vein every 21 ( twenty-one) days.   predniSONE (DELTASONE) 20 MG tablet Take 100 mg (5 tablets) by mouth daily with food on days 1-5 of chemotherapy   promethazine (PHENERGAN) 25 MG tablet Take 1 tablet (25 mg total) by mouth every 6 (six) hours as needed for nausea or vomiting.   RITUXIMAB IV Inject into the vein every 21 ( twenty-one) days.   sucralfate (CARAFATE) 1 GM/10ML suspension Mix 10 ml with 10 ml of lidocaine and swish and swallow 4 times daily   VITAMIN E PO Take by mouth.   No facility-administered encounter medications on file as of 08/23/2022.    ALLERGIES:  Allergies  Allergen Reactions   Avocado Itching   Banana Itching   Latex Itching    Eyes watering, itching.   Lisinopril     angioedema   Penicillins     Facial swelling   Rituxan [Rituximab]     Itching in her throat    Vancomycin Itching     PHYSICAL EXAM:  ECOG Performance status: 0  There were no vitals filed for this visit.  There were no vitals filed for this visit.  Physical Exam Vitals reviewed.  Constitutional:      Appearance: Normal appearance.  Cardiovascular:     Rate and Rhythm: Normal rate and regular rhythm.   Pulmonary:     Breath sounds: Normal breath sounds.  Abdominal:     Palpations: Abdomen is soft. There is no mass.  Neurological:     Mental Status: She is alert.  Psychiatric:        Mood and Affect: Mood normal.        Behavior: Behavior normal.   Right groin lymphadenopathy palpable.  LABORATORY DATA:  I have reviewed the labs as listed.  CBC    Component Value Date/Time   WBC 0.5 (LL) 08/08/2022 4801  RBC 4.47 08/08/2022 0902   HGB 12.8 08/08/2022 0902   HCT 38.6 08/08/2022 0902   PLT 120 (L) 08/08/2022 0902   MCV 86.4 08/08/2022 0902   MCH 28.6 08/08/2022 0902   MCHC 33.2 08/08/2022 0902   RDW 12.4 08/08/2022 0902   LYMPHSABS 0.2 (L) 08/08/2022 0902   MONOABS 0.0 (L) 08/08/2022 0902   EOSABS 0.1 08/08/2022 0902   BASOSABS 0.0 08/08/2022 0902      Latest Ref Rng & Units 08/08/2022    9:02 AM 08/01/2022    8:01 AM 06/24/2022   11:42 AM  CMP  Glucose 70 - 99 mg/dL 125  126  107   BUN 8 - 23 mg/dL _0 Creatinine 0.44 - 1.00 mg/dL 0.61  0.71  0.81   Sodium 135 - 145 mmol/L 137  139  139   Potassium 3.5 - 5.1 mmol/L 4.1  4.6  4.8   Chloride 98 - 111 mmol/L 107  106  106   CO2 22 - 32 mmol/L _1 Calcium 8.9 - 10.3 mg/dL 8.8  9.1  9.3   Total Protein 6.5 - 8.1 g/dL 6.5  7.4  7.7   Total Bilirubin 0.3 - 1.2 mg/dL 1.1  0.7  0.8   Alkaline Phos 38 - 126 U/L 53  67  70   AST 15 - 41 U/L 40  55  59   ALT 0 - 44 U/L 71  67  62     DIAGNOSTIC IMAGING:  I have independently reviewed the scans and discussed with the patient.  ASSESSMENT:  1.  Stage III DLBCL: - CT soft tissue neck (06/06/2022): Left supraclavicular mass 1.9 x 1.6 cm central hypodensity.  Smaller adjacent nodule present.  Oval-shaped mass anterior to the left IJ vein 2.9 x 1.8 cm.  No definite 2 exophytic oropharyngeal or hypopharyngeal mass noted.  Thyroid intact.  Lung bases clear. - Left supraclavicular lymph node biopsy by Dr. Ladona Horns on 06/10/2022 - Pathology: Atypical B-cell  lymphoid infiltrate highly concerning for a lymphoproliferative disorder, possibly high-grade in the background of extensive granulomatous type inflammation and necrosis. - 2D echo (06/28/2022): LVEF 60-65%. - PET scan (07/07/2022): Markedly hypermetabolic adenopathy in the neck, chest, pelvis and inguinal regions.  Few subcentimeter pulmonary nodules are too small for PET resolution.  Hepatic steatosis. - Right inguinal lymph node biopsy (07/20/2022): Large B-cell lymphoma with Ki-67 70%.  FISH studies for high risk lymphoma sent. - IPI score-3 (age more than 1, stage III, elevated LDH) - 2D echo (06/28/2022): EF 60 to 65%. - Cycle 1 Pola-R-CHP on 08/01/2022   2.  Social/family history: - She lives at home with her husband.  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  Non-smoker.  No history of exposure to chemicals or pesticides. - Mother died of pancreatic cancer.  Father had prostate cancer.  Sister had breast cancer at age 42.  Brother had kidney cancer at age 44.    PLAN:  1.  Stage III DLBCL: - She had  uneventful last 3 weeks. - She has been eating better for the last 1 week. - She has numbness of both hands occasionally at nighttime.  No numbness during daytime. - Reviewed labs today which showed AST and ALT elevated at 68 and 67 respectively.  Bilirubin is normal.  Creatinine and electrolytes are normal.  LDH is elevated at 200.  CBC was grossly normal. - She may proceed with cycle  2 without any dose modifications. - RTC 3 weeks for follow-up with repeat labs and cycle 3.   2.  TLS prophylaxis: - Labs including uric acid, magnesium, potassium within normal limits. - Continue allopurinol 300 mg daily.  3.  Acid reflux: - Continue Protonix 40 mg daily which is helping.  4.  Nausea: - Continue Zofran 8 mg every 8 hours as needed.    Orders placed this encounter:  No orders of the defined types were placed in this encounter.      Derek Jack, MD [place] [phone]

## 2022-08-23 NOTE — Patient Instructions (Addendum)
[facility]  Discharge Instructions  You were seen and examined today by Dr. Katragadda.  Proceed with treatment as planned.  Follow-up as scheduled.  Thank you for choosing [facility] to provide your oncology and hematology care.   To afford each patient quality time with our provider, please arrive at least 15 minutes before your scheduled appointment time. You may need to reschedule your appointment if you arrive late (10 or more minutes). Arriving late affects you and other patients whose appointments are after yours.  Also, if you miss three or more appointments without notifying the office, you may be dismissed from the clinic at the provider's discretion.    Again, thank you for choosing [facility].  Our hope is that these requests will decrease the amount of time that you wait before being seen by our physicians.   If you have a lab appointment with the Cancer Center please come in thru the Main Entrance and check in at the main information desk.           _____________________________________________________________  Should you have questions after your visit to [facility], please contact our office at [phone] and follow the prompts.  Our office hours are 8:00 a.m. to 4:30 p.m. Monday - Thursday and 8:00 a.m. to 2:30 p.m. Friday.  Please note that voicemails left after 4:00 p.m. may not be returned until the following business day.  We are closed weekends and all major holidays.  You do have access to a nurse 24-7, just call the main number to the clinic [phone] and do not press any options, hold on the line and a nurse will answer the phone.    For prescription refill requests, have your pharmacy contact our office and allow 72 hours.    Masks are optional in the cancer centers. If you would like for your care team to wear a mask while they are taking care of you, please let them know.  You may have one support person who is at least 63 years old accompany you for your appointments.  

## 2022-08-23 NOTE — Progress Notes (Signed)
Patient has been assessed, vital signs and labs have been reviewed by Dr. Katragadda. ANC, Creatinine, LFTs, and Platelets are within treatment parameters per Dr. Katragadda. The patient is good to proceed with treatment at this time. Primary RN and pharmacy aware.  

## 2022-08-23 NOTE — Patient Instructions (Signed)
[facility]  Discharge Instructions: Thank you for choosing [facility] to provide your oncology and hematology care.  If you have a lab appointment with the [facility], please come in thru the Main Entrance and check in at the main information desk.  Wear comfortable clothing and clothing appropriate for easy access to any Portacath or PICC line.   We strive to give you quality time with your provider. You may need to reschedule your appointment if you arrive late (15 or more minutes).  Arriving late affects you and other patients whose appointments are after yours.  Also, if you miss three or more appointments without notifying the office, you may be dismissed from the clinic at the provider's discretion.      For prescription refill requests, have your pharmacy contact our office and allow 72 hours for refills to be completed.    Today you received the following chemotherapy and/or immunotherapy agents Rituximab, polivy, Cytoxan and Doxorubicin   To help prevent nausea and vomiting after your treatment, we encourage you to take your nausea medication as directed.  BELOW ARE SYMPTOMS THAT SHOULD BE REPORTED IMMEDIATELY: *FEVER GREATER THAN 100.4 F (38 C) OR HIGHER *CHILLS OR SWEATING *NAUSEA AND VOMITING THAT IS NOT CONTROLLED WITH YOUR NAUSEA MEDICATION *UNUSUAL SHORTNESS OF BREATH *UNUSUAL BRUISING OR BLEEDING *URINARY PROBLEMS (pain or burning when urinating, or frequent urination) *BOWEL PROBLEMS (unusual diarrhea, constipation, pain near the anus) TENDERNESS IN MOUTH AND THROAT WITH OR WITHOUT PRESENCE OF ULCERS (sore throat, sores in mouth, or a toothache) UNUSUAL RASH, SWELLING OR PAIN  UNUSUAL VAGINAL DISCHARGE OR ITCHING   Items with * indicate a potential emergency and should be followed up as soon as possible or go to the Emergency Department if any problems should occur.  Please show the CHEMOTHERAPY ALERT CARD or IMMUNOTHERAPY ALERT  CARD at check-in to the Emergency Department and triage nurse.  Should you have questions after your visit or need to cancel or reschedule your appointment, please contact [facility] [phone]  and follow the prompts.  Office hours are 8:00 a.m. to 4:30 p.m. Monday - Friday. Please note that voicemails left after 4:00 p.m. may not be returned until the following business day.  We are closed weekends and major holidays. You have access to a nurse at all times for urgent questions. Please call the main number to the clinic [phone] and follow the prompts.  For any non-urgent questions, you may also contact your provider using MyChart. We now offer e-Visits for anyone 26 and older to request care online for non-urgent symptoms. For details visit [email or web address].   Also download the MyChart app! Go to the app store, search "MyChart", open the app, select [facility], and log in with your MyChart username and password.  Masks are optional in the cancer centers. If you would like for your care team to wear a mask while they are taking care of you, please let them know. You may have one support person who is at least 63 years old accompany you for your appointments.

## 2022-08-25 ENCOUNTER — Other Ambulatory Visit: Payer: Self-pay

## 2022-08-25 ENCOUNTER — Ambulatory Visit: Payer: BC Managed Care – PPO

## 2022-08-26 ENCOUNTER — Other Ambulatory Visit: Payer: Self-pay

## 2022-08-29 ENCOUNTER — Other Ambulatory Visit: Payer: Self-pay

## 2022-08-29 ENCOUNTER — Encounter: Payer: Self-pay | Admitting: Hematology

## 2022-08-29 ENCOUNTER — Other Ambulatory Visit: Payer: Self-pay | Admitting: *Deleted

## 2022-08-29 MED ORDER — ONDANSETRON HCL 4 MG PO TABS: 4.0000 mg | ORAL_TABLET | Freq: Three times a day (TID) | ORAL | 3 refills | Status: DC | PRN

## 2022-08-30 ENCOUNTER — Other Ambulatory Visit: Payer: Self-pay | Admitting: *Deleted

## 2022-08-30 ENCOUNTER — Inpatient Hospital Stay (HOSPITAL_COMMUNITY)
Admission: EM | Admit: 2022-08-30 | Discharge: 2022-09-01 | DRG: 809 | Disposition: A | Discharge status: Home / Self Care | Payer: Managed Care, Other (non HMO) | Attending: Family Medicine | Admitting: Family Medicine

## 2022-08-30 ENCOUNTER — Emergency Department (HOSPITAL_COMMUNITY): Payer: Managed Care, Other (non HMO)

## 2022-08-30 ENCOUNTER — Telehealth: Payer: Self-pay | Admitting: *Deleted

## 2022-08-30 ENCOUNTER — Other Ambulatory Visit: Payer: Self-pay

## 2022-08-30 ENCOUNTER — Encounter (HOSPITAL_COMMUNITY): Payer: Self-pay | Admitting: Emergency Medicine

## 2022-08-30 DIAGNOSIS — Z91040 Latex allergy status: Secondary | ICD-10-CM | POA: Diagnosis not present

## 2022-08-30 DIAGNOSIS — E039 Hypothyroidism, unspecified: Secondary | ICD-10-CM | POA: Diagnosis present

## 2022-08-30 DIAGNOSIS — M109 Gout, unspecified: Secondary | ICD-10-CM | POA: Diagnosis present

## 2022-08-30 DIAGNOSIS — Z881 Allergy status to other antibiotic agents status: Secondary | ICD-10-CM | POA: Diagnosis not present

## 2022-08-30 DIAGNOSIS — K219 Gastro-esophageal reflux disease without esophagitis: Secondary | ICD-10-CM | POA: Diagnosis present

## 2022-08-30 DIAGNOSIS — K760 Fatty (change of) liver, not elsewhere classified: Secondary | ICD-10-CM | POA: Diagnosis present

## 2022-08-30 DIAGNOSIS — E785 Hyperlipidemia, unspecified: Secondary | ICD-10-CM | POA: Diagnosis present

## 2022-08-30 DIAGNOSIS — E119 Type 2 diabetes mellitus without complications: Secondary | ICD-10-CM | POA: Diagnosis present

## 2022-08-30 DIAGNOSIS — Z79899 Other long term (current) drug therapy: Secondary | ICD-10-CM | POA: Diagnosis not present

## 2022-08-30 DIAGNOSIS — Z800 Family history of malignant neoplasm of digestive organs: Secondary | ICD-10-CM | POA: Diagnosis not present

## 2022-08-30 DIAGNOSIS — I10 Essential (primary) hypertension: Secondary | ICD-10-CM | POA: Insufficient documentation

## 2022-08-30 DIAGNOSIS — C8330 Diffuse large B-cell lymphoma, unspecified site: Secondary | ICD-10-CM | POA: Diagnosis present

## 2022-08-30 DIAGNOSIS — Z1152 Encounter for screening for COVID-19: Secondary | ICD-10-CM

## 2022-08-30 DIAGNOSIS — R5081 Fever presenting with conditions classified elsewhere: Secondary | ICD-10-CM | POA: Diagnosis present

## 2022-08-30 DIAGNOSIS — Z8249 Family history of ischemic heart disease and other diseases of the circulatory system: Secondary | ICD-10-CM | POA: Diagnosis not present

## 2022-08-30 DIAGNOSIS — Z8042 Family history of malignant neoplasm of prostate: Secondary | ICD-10-CM | POA: Diagnosis not present

## 2022-08-30 DIAGNOSIS — Z880 Allergy status to penicillin: Secondary | ICD-10-CM | POA: Diagnosis not present

## 2022-08-30 DIAGNOSIS — D709 Neutropenia, unspecified: Secondary | ICD-10-CM | POA: Diagnosis present

## 2022-08-30 DIAGNOSIS — Z794 Long term (current) use of insulin: Secondary | ICD-10-CM | POA: Diagnosis not present

## 2022-08-30 DIAGNOSIS — Z888 Allergy status to other drugs, medicaments and biological substances status: Secondary | ICD-10-CM

## 2022-08-30 DIAGNOSIS — K1230 Oral mucositis (ulcerative), unspecified: Secondary | ICD-10-CM | POA: Diagnosis present

## 2022-08-30 DIAGNOSIS — Z7984 Long term (current) use of oral hypoglycemic drugs: Secondary | ICD-10-CM | POA: Diagnosis not present

## 2022-08-30 DIAGNOSIS — F419 Anxiety disorder, unspecified: Secondary | ICD-10-CM | POA: Diagnosis present

## 2022-08-30 DIAGNOSIS — Z91018 Allergy to other foods: Secondary | ICD-10-CM

## 2022-08-30 DIAGNOSIS — C8338 Diffuse large B-cell lymphoma, lymph nodes of multiple sites: Secondary | ICD-10-CM | POA: Diagnosis not present

## 2022-08-30 DIAGNOSIS — R509 Fever, unspecified: Secondary | ICD-10-CM | POA: Diagnosis present

## 2022-08-30 LAB — RESP PANEL BY RT-PCR (FLU A&B, COVID) ARPGX2
Influenza A by PCR: NEGATIVE
Influenza B by PCR: NEGATIVE
SARS Coronavirus 2 by RT PCR: NEGATIVE

## 2022-08-30 LAB — CBC WITH DIFFERENTIAL/PLATELET
Abs Immature Granulocytes: 0 10*3/uL (ref 0.00–0.07)
Basophils Absolute: 0 10*3/uL (ref 0.0–0.1)
Basophils Relative: 6 %
Eosinophils Absolute: 0.1 10*3/uL (ref 0.0–0.5)
Eosinophils Relative: 35 %
HCT: 35.3 % — ABNORMAL LOW (ref 36.0–46.0)
Hemoglobin: 11.7 g/dL — ABNORMAL LOW (ref 12.0–15.0)
Immature Granulocytes: 0 %
Lymphocytes Relative: 35 %
Lymphs Abs: 0.1 10*3/uL — ABNORMAL LOW (ref 0.7–4.0)
MCH: 28.7 pg (ref 26.0–34.0)
MCHC: 33.1 g/dL (ref 30.0–36.0)
MCV: 86.5 fL (ref 80.0–100.0)
Monocytes Absolute: 0.1 10*3/uL (ref 0.1–1.0)
Monocytes Relative: 18 %
Neutro Abs: 0 10*3/uL — CL (ref 1.7–7.7)
Neutrophils Relative %: 6 %
Platelets: 126 10*3/uL — ABNORMAL LOW (ref 150–400)
RBC: 4.08 MIL/uL (ref 3.87–5.11)
RDW: 12.7 % (ref 11.5–15.5)
WBC: 0.3 10*3/uL — CL (ref 4.0–10.5)
nRBC: 0 % (ref 0.0–0.2)

## 2022-08-30 LAB — URINALYSIS, ROUTINE W REFLEX MICROSCOPIC
Bilirubin Urine: NEGATIVE
Glucose, UA: NEGATIVE mg/dL
Hgb urine dipstick: NEGATIVE
Ketones, ur: NEGATIVE mg/dL
Leukocytes,Ua: NEGATIVE
Nitrite: NEGATIVE
Protein, ur: NEGATIVE mg/dL
Specific Gravity, Urine: 1.012 (ref 1.005–1.030)
pH: 6 (ref 5.0–8.0)

## 2022-08-30 LAB — COMPREHENSIVE METABOLIC PANEL
ALT: 73 U/L — ABNORMAL HIGH (ref 0–44)
AST: 51 U/L — ABNORMAL HIGH (ref 15–41)
Albumin: 4 g/dL (ref 3.5–5.0)
Alkaline Phosphatase: 71 U/L (ref 38–126)
Anion gap: 11 (ref 5–15)
BUN: 12 mg/dL (ref 8–23)
CO2: 23 mmol/L (ref 22–32)
Calcium: 8.9 mg/dL (ref 8.9–10.3)
Chloride: 103 mmol/L (ref 98–111)
Creatinine, Ser: 0.74 mg/dL (ref 0.44–1.00)
GFR, Estimated: >60 mL/min (ref >60)
Glucose, Bld: 128 mg/dL — ABNORMAL HIGH (ref 70–99)
Potassium: 3.9 mmol/L (ref 3.5–5.1)
Sodium: 137 mmol/L (ref 135–145)
Total Bilirubin: 0.8 mg/dL (ref 0.3–1.2)
Total Protein: 6.7 g/dL (ref 6.5–8.1)

## 2022-08-30 LAB — LACTIC ACID, PLASMA
Lactic Acid, Venous: 2.4 mmol/L (ref 0.5–1.9)
Lactic Acid, Venous: 2.5 mmol/L (ref 0.5–1.9)

## 2022-08-30 MED ORDER — SODIUM CHLORIDE 0.9 % IV SOLN
8.0000 mg/kg | Freq: Every day | INTRAVENOUS | Status: DC
  Administered 2022-08-31: 450 mg via INTRAVENOUS
  Filled 2022-08-30 (×2): qty 9

## 2022-08-30 MED ORDER — SODIUM CHLORIDE 0.9 % IV SOLN
2.0000 g | Freq: Three times a day (TID) | INTRAVENOUS | Status: DC
  Administered 2022-08-30: 2 g via INTRAVENOUS
  Filled 2022-08-30: qty 10

## 2022-08-30 MED ORDER — SODIUM CHLORIDE 0.9 % IV SOLN
8.0000 mg/kg | Freq: Every day | INTRAVENOUS | Status: DC
  Administered 2022-08-30: 450 mg via INTRAVENOUS
  Filled 2022-08-30: qty 9

## 2022-08-30 MED ORDER — PANTOPRAZOLE SODIUM 40 MG PO TBEC
40.0000 mg | DELAYED_RELEASE_TABLET | Freq: Every day | ORAL | Status: DC
  Administered 2022-08-31 – 2022-09-01 (×2): 40 mg via ORAL
  Filled 2022-08-30 (×2): qty 1

## 2022-08-30 MED ORDER — ALPRAZOLAM 1 MG PO TABS
1.0000 mg | ORAL_TABLET | Freq: Three times a day (TID) | ORAL | Status: DC | PRN
  Administered 2022-08-30 – 2022-08-31 (×3): 1 mg via ORAL
  Filled 2022-08-30 (×3): qty 1

## 2022-08-30 MED ORDER — LINEZOLID 600 MG/300ML IV SOLN
600.0000 mg | Freq: Two times a day (BID) | INTRAVENOUS | Status: DC
  Filled 2022-08-30 (×2): qty 300

## 2022-08-30 MED ORDER — SARGRAMOSTIM 250 MCG IJ SOLR: 250.0000 ug | Freq: Every day | INTRAMUSCULAR | Status: DC

## 2022-08-30 MED ORDER — SUCRALFATE 1 GM/10ML PO SUSP
1.0000 g | Freq: Four times a day (QID) | ORAL | Status: DC
  Administered 2022-08-31 – 2022-09-01 (×6): 1 g via ORAL
  Filled 2022-08-30 (×7): qty 10

## 2022-08-30 MED ORDER — TBO-FILGRASTIM 300 MCG/0.5ML ~~LOC~~ SOSY
300.0000 ug | PREFILLED_SYRINGE | Freq: Once | SUBCUTANEOUS | Status: AC
  Administered 2022-08-30: 300 ug via SUBCUTANEOUS
  Filled 2022-08-30: qty 0.5

## 2022-08-30 MED ORDER — HEPARIN SODIUM (PORCINE) 5000 UNIT/ML IJ SOLN
5000.0000 [IU] | Freq: Three times a day (TID) | INTRAMUSCULAR | Status: DC
  Administered 2022-08-30 – 2022-09-01 (×5): 5000 [IU] via SUBCUTANEOUS
  Filled 2022-08-30 (×5): qty 1

## 2022-08-30 MED ORDER — CITALOPRAM HYDROBROMIDE 10 MG PO TABS: 5.0000 mg | ORAL_TABLET | Freq: Every day | ORAL | Status: DC

## 2022-08-30 MED ORDER — ACETAMINOPHEN 325 MG PO TABS
650.0000 mg | ORAL_TABLET | Freq: Once | ORAL | Status: AC
  Administered 2022-08-30: 650 mg via ORAL
  Filled 2022-08-30: qty 2

## 2022-08-30 MED ORDER — ALLOPURINOL 100 MG PO TABS
300.0000 mg | ORAL_TABLET | Freq: Every day | ORAL | Status: DC
  Administered 2022-08-30: 300 mg via ORAL
  Filled 2022-08-30 (×3): qty 3

## 2022-08-30 MED ORDER — ONDANSETRON HCL 4 MG/2ML IJ SOLN
4.0000 mg | Freq: Once | INTRAMUSCULAR | Status: AC
  Administered 2022-08-30: 4 mg via INTRAVENOUS
  Filled 2022-08-30: qty 2

## 2022-08-30 MED ORDER — LIDOCAINE VISCOUS HCL 2 % MT SOLN
10.0000 mL | Freq: Four times a day (QID) | OROMUCOSAL | Status: DC | PRN
  Administered 2022-08-31 – 2022-09-01 (×5): 10 mL via OROMUCOSAL
  Filled 2022-08-30 (×5): qty 15

## 2022-08-30 MED ORDER — SODIUM CHLORIDE 0.9 % IV SOLN
1.0000 g | Freq: Three times a day (TID) | INTRAVENOUS | Status: DC
  Administered 2022-08-30 – 2022-09-01 (×6): 1 g via INTRAVENOUS
  Filled 2022-08-30 (×7): qty 20

## 2022-08-30 MED ORDER — LEVOTHYROXINE SODIUM 50 MCG PO TABS
50.0000 ug | ORAL_TABLET | Freq: Every day | ORAL | Status: DC
  Administered 2022-08-31 – 2022-09-01 (×2): 50 ug via ORAL
  Filled 2022-08-30 (×2): qty 1

## 2022-08-30 MED ORDER — SODIUM CHLORIDE 0.9 % IV BOLUS
1000.0000 mL | Freq: Once | INTRAVENOUS | Status: AC
  Administered 2022-08-30: 1000 mL via INTRAVENOUS

## 2022-08-30 NOTE — ED Triage Notes (Signed)
Pt c/o fever/body aches since 10 last night. Pt currently taking chemo for lymphoma. Last chemo 08/23/22. A/o. No gen weakness noted.

## 2022-08-30 NOTE — ED Notes (Signed)
[place], MD notified re: WBC 0.3 and abs neutrophil 0.0 via face to face communication

## 2022-08-30 NOTE — Hospital Course (Addendum)
Debbie Hall is a 61 year old female with a history of hypertension, hyperlipidemia, anxiety, diabetes mellitus type 2 presenting with myalgias, arthralgias, and fever up to 101.5 F at home on the evening of 08/29/2022.  The patient has a history of diffuse large B-cell lymphoma for which she last received chemotherapy on 08/23/2022.  The patient stated that she had received Neulasta along with her chemotherapy.  She denies any headache, neck pain, chest pain, cough, hemoptysis, shortness of breath, nausea, vomiting, diarrhea, abdominal pain, dysuria, hematuria.  She denies any unusual rashes or synovitis.  She denies any new medications or over-the-counter supplements.  ED: Temp; 100.5 F. saturation 98% on room air.  WBC 0.3, hemoglobin 11.7, platelets 126,000.  Sodium 137, potassium 3.9, bicarbonate 23, BUN 12, creatinine 0.74.  AST 51, ALT 73, alk phosphatase 71, total bilirubin 0.8.  Lactic acid peaked at 2.5.  Blood cultures were obtained.  Chest x-ray was negative for any acute findings.  COVID-19 PCR was Negative.  The patient was started on Cubicin and Merrem.     Assessment & Plan:   Principal Problem:   Febrile neutropenia (HCC) Active Problems:   Diffuse large B cell lymphoma (HCC)   Essential hypertension   Febrile neutropenia This morning vitals:  BP 132/69, HR 90, temp. 99.6 F (37.6 C), RR  20,  SpO2 97 % on RA   -ANC 18 -start Leukine -start daptomycin (Vanc allergy) -start merrem - reviewed chest x-ray--no infiltrates or edema -UA negative for pyuria -Follow blood cultures: NGTD    Diffuse large B-cell lymphoma -Follows Dr. Delton Hall -Last chemotherapy (Pola-R-CHP) 08/23/2022 -Continue allopurinol   Essential hypertension -Holding metoprolol secondary to soft blood pressure   Anxiety Restart home dose alprazolam -PDMP reviewed--patient receives alprazolam 1 mg #90, last refill 08/24/2022 -Continue Lexapro   Hypothyroidism -Continue Synthroid    Personal hx of PCN allergy -questionable history -her facial swelling was also in setting of lisinopril use -discussed risks/benefits/alternatives of using carbapenem with pt including but not limited to anaphylaxis.  She is willing to try carbapenem   Headaches As needed Tylenol, Vicodin, Will dose of Dilaudid for severe pain

## 2022-08-30 NOTE — H&P (Signed)
History and Physical    Patient: Debbie Hall HER:[id number] DOB: 1960-10-18 DOA: 08/30/2022 DOS: the patient was seen and examined on 08/30/2022 PCP: Tempie Hoist, FNP  Patient coming from: Home  Chief Complaint:  Chief Complaint  Patient presents with   Fever    Cancer pt   HPI: Debbie Hall is a 61 year old female with a history of hypertension, hyperlipidemia, anxiety, diabetes mellitus type 2 presenting with myalgias, arthralgias, and fever up to 101.5 F at home on the evening of 08/29/2022.  The patient has a history of diffuse large B-cell lymphoma for which she last received chemotherapy on 08/23/2022.  The patient stated that she had received Neulasta along with her chemotherapy.  She denies any headache, neck pain, chest pain, cough, hemoptysis, shortness of breath, nausea, vomiting, diarrhea, abdominal pain, dysuria, hematuria.  She denies any unusual rashes or synovitis.  She denies any new medications or over-the-counter supplements. In the ED, the patient was febrile up to 100.5 F.  She was hemodynamically stable with oxygen saturation 98% on room air.  WBC 0.3, hemoglobin 11.7, platelets 126,000.  Sodium 137, potassium 3.9, bicarbonate 23, BUN 12, creatinine 0.74.  AST 51, ALT 73, alk phosphatase 71, total bilirubin 0.8.  Lactic acid peaked at 2.5.  Blood cultures were obtained.  Chest x-ray was negative for any acute findings.  COVID-19 PCR was negative.  The patient was started on Cubicin and Merrem.  Review of Systems: As mentioned in the history of present illness. All other systems reviewed and are negative. Past Medical History:  Diagnosis Date   Anemia    Anxiety    Diabetes mellitus ([place])    Fatty liver    GERD (gastroesophageal reflux disease)    Gout    Hyperlipidemia    Hypertension    Migraines    Pneumonia    as a child   Port-A-Cath in place 07/28/2022   Past Surgical History:  Procedure Laterality Date   ABDOMINAL HYSTERECTOMY     partial    CESAREAN SECTION     x 2   TONSILLECTOMY     Social History:  reports that she has never smoked. She has never used smokeless tobacco. She reports current alcohol use. She reports that she does not use drugs.  Allergies  Allergen Reactions   Avocado Itching   Banana Itching   Latex Itching    Eyes watering, itching.   Lisinopril     angioedema   Penicillins     Facial swelling   Rituxan [Rituximab]     Itching in her throat    Vancomycin Itching    Family History  Problem Relation Age of Onset   Hypertension Mother    Heart disease Mother    Heart attack Mother    Pancreatic cancer Mother    Prostate cancer Father    Colon cancer Neg Hx    Esophageal cancer Neg Hx    Rectal cancer Neg Hx     Prior to Admission medications   Medication Sig Start Date End Date Taking? Authorizing Provider  allopurinol (ZYLOPRIM) 300 MG tablet Take 1 tablet (300 mg total) by mouth daily. 08/01/22  Yes Derek Jack, MD  ALPRAZolam Duanne Moron) 1 MG tablet Take 1 mg by mouth 3 (three) times daily as needed for anxiety or sleep. 08/03/22  Yes [provider]  CYCLOPHOSPHAMIDE IV Inject into the vein every 21 ( twenty-one) days. 08/01/22  Yes [provider]  DOXORUBICIN HCL IV Inject into the vein  every 21 ( twenty-one) days.   Yes [provider]  escitalopram (LEXAPRO) 5 MG tablet Take 1 tablet by mouth daily. 10/10/19  Yes [provider]  ibuprofen (ADVIL) 200 MG tablet Take 600 mg by mouth every 6 (six) hours as needed for moderate pain.   Yes [provider]  Insulin Glargine w/ Trans Port 100 UNIT/ML SOPN Inject 10 Units into the skin daily. 03/22/21  Yes [provider]  Polatuzumab Vedotin-piiq (POLIVY IV) Inject into the vein every 21 ( twenty-one) days. 08/01/22  Yes [provider]  RITUXIMAB IV Inject into the vein every 21 ( twenty-one) days. 08/01/22  Yes [provider]  levothyroxine (SYNTHROID) 50 MCG tablet  Take 50 mcg by mouth daily. 06/07/22   [provider]  lidocaine (XYLOCAINE) 2 % solution Mix 10 ml with 10 ml of Carafate an swish and swallow 4 times daily 08/10/22   Derek Jack, MD  lidocaine-prilocaine (EMLA) cream Apply 1 Application topically as needed (Local anesthesia for port). 07/27/22   Derek Jack, MD  metoprolol tartrate (LOPRESSOR) 50 MG tablet Take 25 mg by mouth in the morning and at bedtime. 09/02/19   [provider]  Omega-3 Fatty Acids (FISH OIL) 1000 MG CAPS Take by mouth.    [provider]  ondansetron (ZOFRAN) 4 MG tablet Take 1 tablet (4 mg total) by mouth every 8 (eight) hours as needed for nausea or vomiting. 08/29/22   Derek Jack, MD  pantoprazole (PROTONIX) 40 MG tablet Take 1 tablet (40 mg total) by mouth daily. 08/08/22   Derek Jack, MD  predniSONE (DELTASONE) 20 MG tablet Take 100 mg (5 tablets) by mouth daily with food on days 1-5 of chemotherapy 08/01/22   Derek Jack, MD  promethazine (PHENERGAN) 25 MG tablet Take 1 tablet (25 mg total) by mouth every 6 (six) hours as needed for nausea or vomiting. 08/01/22   Derek Jack, MD  sucralfate (CARAFATE) 1 GM/10ML suspension Mix 10 ml with 10 ml of lidocaine and swish and swallow 4 times daily 08/10/22   Derek Jack, MD  VITAMIN E PO Take by mouth.    [provider]    Physical Exam: Vitals:   08/30/22 1046 08/30/22 1330 08/30/22 1500  BP: 120/69 109/67 105/70  Pulse: 93 78 86  Resp: _0 Temp: (!) 100.5 F (38.1 C)  (P) 97.8 F (36.6 C)  TempSrc: Oral    SpO2: 97% 95%    GENERAL:  A&O x 3, NAD, well developed, cooperative, follows commands HEENT: [place]/AT, No thrush, No icterus, No oral ulcers Neck:  No neck mass, No meningismus, soft, supple CV: RRR, no S3, no S4, no rub, no JVD Lungs:  CTA, no wheeze, no rhonchi, good air movement Abd: soft/NT +BS, nondistended Ext: No edema, no lymphangitis, no cyanosis,  no rashes Neuro:  CN II-XII intact, strength 4/5 in RUE, RLE, strength 4/5 LUE, LLE; sensation intact bilateral; no dysmetria; babinski equivocal  Data Reviewed: Data reviewed above in history  Assessment and Plan: Febrile neutropenia -ANC 18 -start Leukine -start daptomycin (Vanc allergy) -start merrem -Personally reviewed chest x-ray--no infiltrates or edema -UA negative for pyuria -Follow blood cultures  Diffuse large B-cell lymphoma -Follows Dr. Delton Coombes -Last chemotherapy (Pola-R-CHP) 08/23/2022 -Continue allopurinol  Essential hypertension -Holding metoprolol secondary to soft blood pressure  Anxiety Restart home dose alprazolam -PDMP reviewed--patient receives alprazolam 1 mg #90, last refill 08/24/2022 -Continue Lexapro  Hypothyroidism -Continue Synthroid  Personal hx of PCN allergy -questionable  history -her facial swelling was also in setting of lisinopril use -discussed risks/benefits/alternatives of using carbapenem with pt including but not limited to anaphylaxis.  She is willing to try carbapenem    Advance Care Planning: FULL  Consults: none  Family Communication: none  Severity of Illness: The appropriate patient status for this patient is INPATIENT. Inpatient status is judged to be reasonable and necessary in order to provide the required intensity of service to ensure the patient's safety. The patient's presenting symptoms, physical exam findings, and initial radiographic and laboratory data in the context of their chronic comorbidities is felt to place them at high risk for further clinical deterioration. Furthermore, it is not anticipated that the patient will be medically stable for discharge from the hospital within 2 midnights of admission.   * I certify that at the point of admission it is my clinical judgment that the patient will require inpatient hospital care spanning beyond 2 midnights from the point of admission due to high intensity of  service, high risk for further deterioration and high frequency of surveillance required.*  Author: Orson Eva, MD 08/30/2022 3:23 PM  For on call review [email or web address].

## 2022-08-30 NOTE — Telephone Encounter (Signed)
Patient called to make [place] aware that she is running a fever >101 and is feeling generalized malaise associated with sore throat.  Since she is currently receiving chemo for lymphoma, she was advised to report to the ER and call us with outcome.  Verbalized understanding.

## 2022-08-30 NOTE — Progress Notes (Addendum)
Pharmacy Antibiotic Note  Debbie Hall is a 61 y.o. female admitted on 08/30/2022 with  febrile neutropenia .  Patient consulted to dose meropenem and daptomycin with penicillin and vancomycin allergies.   Plan: Meropenem 1000 mg IV every 8 hours. Daptomycin 450 mg IV every 24 hours. Monitor labs, c/s, and patient improvement.     Temp (24hrs), Avg:100.5 F (38.1 C), Min:100.5 F (38.1 C), Max:100.5 F (38.1 C)  Recent Labs  Lab 08/30/22 1125 08/30/22 1336  WBC 0.3*  --   CREATININE 0.74  --   LATICACIDVEN 2.4* 2.5*    Estimated Creatinine Clearance: 74.4 mL/min (by C-G formula based on SCr of 0.74 mg/dL).    Allergies  Allergen Reactions   Avocado Itching   Banana Itching   Latex Itching    Eyes watering, itching.   Lisinopril     angioedema   Penicillins     Facial swelling   Rituxan [Rituximab]     Itching in her throat    Vancomycin Itching    Antimicrobials this admission: Merrem 12/5 >> Dapto 12/5 >> Aztreonam 12/5  Microbiology results: 12/5 BCx: pending   Thank you for allowing pharmacy to be a part of this patient's care.  Margot Ables, PharmD Clinical Pharmacist 08/30/2022 3:27 PM

## 2022-08-30 NOTE — ED Notes (Signed)
MD notified re: Lactic acid 2.4 via face to face communication

## 2022-08-30 NOTE — ED Provider Notes (Signed)
[facility] EMERGENCY DEPARTMENT Provider Note   CSN: [id number] Arrival date & time: 08/30/22  1032     History {Add pertinent medical, surgical, social history, OB history to HPI:1} Chief Complaint  Patient presents with   Fever    Cancer pt    Debbie Hall is a 61 y.o. female.   Fever Patient presents with fever.  On chemotherapy for lymphoma.  Last chemo was on October 28.  Slight sore throat.  Body aches.  No real coughing.  No known sick contacts.  No nausea or vomiting.    Past Medical History:  Diagnosis Date   Anemia    Anxiety    Diabetes mellitus ([place])    Fatty liver    GERD (gastroesophageal reflux disease)    Gout    Hyperlipidemia    Hypertension    Migraines    Pneumonia    as a child   Port-A-Cath in place 07/28/2022    Home Medications Prior to Admission medications   Medication Sig Start Date End Date Taking? Authorizing Provider  CYCLOPHOSPHAMIDE IV Inject into the vein every 21 ( twenty-one) days. 08/01/22  Yes [provider]  DOXORUBICIN HCL IV Inject into the vein every 21 ( twenty-one) days.   Yes [provider]  Polatuzumab Vedotin-piiq (POLIVY IV) Inject into the vein every 21 ( twenty-one) days. 08/01/22  Yes [provider]  RITUXIMAB IV Inject into the vein every 21 ( twenty-one) days. 08/01/22  Yes [provider]  allopurinol (ZYLOPRIM) 300 MG tablet Take 1 tablet (300 mg total) by mouth daily. 08/01/22   Derek Jack, MD  ALPRAZolam Duanne Moron) 1 MG tablet Take 1 mg by mouth 2 (two) times daily. 08/03/22   [provider]  escitalopram (LEXAPRO) 5 MG tablet Take 1 tablet by mouth daily. 10/10/19   [provider]  ibuprofen (ADVIL) 200 MG tablet Take 600 mg by mouth every 6 (six) hours as needed for moderate pain.    [provider]  Insulin Glargine w/ Trans Port 100 UNIT/ML SOPN Inject 10 Units into the skin daily. 03/22/21   [provider]  levothyroxine  (SYNTHROID) 50 MCG tablet Take 50 mcg by mouth daily. 06/07/22   [provider]  lidocaine (XYLOCAINE) 2 % solution Mix 10 ml with 10 ml of Carafate an swish and swallow 4 times daily 08/10/22   Derek Jack, MD  lidocaine-prilocaine (EMLA) cream Apply 1 Application topically as needed (Local anesthesia for port). 07/27/22   Derek Jack, MD  metoprolol tartrate (LOPRESSOR) 50 MG tablet Take 25 mg by mouth in the morning and at bedtime. 09/02/19   [provider]  Omega-3 Fatty Acids (FISH OIL) 1000 MG CAPS Take by mouth.    [provider]  ondansetron (ZOFRAN) 4 MG tablet Take 1 tablet (4 mg total) by mouth every 8 (eight) hours as needed for nausea or vomiting. 08/29/22   Derek Jack, MD  pantoprazole (PROTONIX) 40 MG tablet Take 1 tablet (40 mg total) by mouth daily. 08/08/22   Derek Jack, MD  predniSONE (DELTASONE) 20 MG tablet Take 100 mg (5 tablets) by mouth daily with food on days 1-5 of chemotherapy 08/01/22   Derek Jack, MD  promethazine (PHENERGAN) 25 MG tablet Take 1 tablet (25 mg total) by mouth every 6 (six) hours as needed for nausea or vomiting. 08/01/22   Derek Jack, MD  sucralfate (CARAFATE) 1 GM/10ML suspension Mix 10 ml with 10 ml of lidocaine and swish and swallow 4  times daily 08/10/22   Derek Jack, MD  VITAMIN E PO Take by mouth.    [provider]      Allergies    Avocado, Banana, Latex, Lisinopril, Penicillins, Rituxan [rituximab], and Vancomycin    Review of Systems   Review of Systems  Constitutional:  Positive for fever.    Physical Exam Updated Vital Signs BP 109/67   Pulse 78   Temp (!) 100.5 F (38.1 C) (Oral)   Resp 14   SpO2 95%  Physical Exam Vitals and nursing note reviewed.  Cardiovascular:     Rate and Rhythm: Regular rhythm.  Pulmonary:     Effort: Pulmonary effort is normal.     Comments: Lungs clear.  No focal rales or rhonchi.  Right chest wall  with port without skin erythema. Abdominal:     Tenderness: There is no abdominal tenderness.  Musculoskeletal:        General: No tenderness.     Cervical back: Neck supple.  Skin:    General: Skin is warm.     Capillary Refill: Capillary refill takes less than 2 seconds.  Neurological:     Mental Status: She is alert and oriented to person, place, and time.     ED Results / Procedures / Treatments   Labs (all labs ordered are listed, but only abnormal results are displayed) Labs Reviewed  COMPREHENSIVE METABOLIC PANEL - Abnormal; Notable for the following components:      Result Value   Glucose, Bld 128 (*)    AST 51 (*)    ALT 73 (*)    All other components within normal limits  LACTIC ACID, PLASMA - Abnormal; Notable for the following components:   Lactic Acid, Venous 2.4 (*)    All other components within normal limits  LACTIC ACID, PLASMA - Abnormal; Notable for the following components:   Lactic Acid, Venous 2.5 (*)    All other components within normal limits  CBC WITH DIFFERENTIAL/PLATELET - Abnormal; Notable for the following components:   WBC 0.3 (*)    Hemoglobin 11.7 (*)    HCT 35.3 (*)    Platelets 126 (*)    Neutro Abs 0.0 (*)    Lymphs Abs 0.1 (*)    All other components within normal limits  RESP PANEL BY RT-PCR (FLU A&B, COVID) ARPGX2  CULTURE, BLOOD (ROUTINE X 2)  CULTURE, BLOOD (ROUTINE X 2)  URINALYSIS, ROUTINE W REFLEX MICROSCOPIC    EKG None  Radiology DG Chest Portable 1 View  Result Date: 08/30/2022 CLINICAL DATA:  Fever, body aches, ongoing chemotherapy EXAM: PORTABLE CHEST 1 VIEW COMPARISON:  07/29/2022 FINDINGS: Mild cardiomegaly. Right chest port catheter. Both lungs are clear. The visualized skeletal structures are unremarkable. IMPRESSION: No acute abnormality of the lungs in AP portable projection. Electronically Signed   By: Delanna Ahmadi M.D.   On: 08/30/2022 11:22    Procedures Procedures  {Document cardiac monitor, telemetry  assessment procedure when appropriate:1}  Medications Ordered in ED Medications  aztreonam (AZACTAM) 2 g in sodium chloride 0.9 % 100 mL IVPB (2 g Intravenous New Bag/Given 08/30/22 1328)  acetaminophen (TYLENOL) tablet 650 mg (650 mg Oral Given 08/30/22 1140)  ondansetron (ZOFRAN) injection 4 mg (4 mg Intravenous Given 08/30/22 1138)    ED Course/ Medical Decision Making/ A&P                           Medical Decision Making Amount and/or Complexity  of Data Reviewed Labs: ordered. Radiology: ordered.  Risk OTC drugs. Prescription drug management.   Patient with fever.  On chemotherapy currently for lymphoma.  Overall feels rather well but a little weak.  Slight sore throat.  Differential diagnosis includes various kinds of infection and major turning factors whether or not she is neutropenic.  Lactic acid mildly elevated 2.4.  However is neutropenic with a white count of 0.3 and an ANC of 0.0. Once neutropenia was found give antibiotics to cover neutropenic fever.  Discussed with pharmacist and gave aztreonam and Vanco due to penicillin allergy.  Lab work is returned and shows no clear source of the fever.  Chest x-ray clear.  Flu COVID testing negative.  Urine does not show infection. will discuss with oncology.  Discussed with Dr. Delton Coombes.  Requests admission to hospital.  Antibiotics as long as she is febrile.  Follow cultures.    {Document critical care time when appropriate:1} {Document review of labs and clinical decision tools ie heart score, Chads2Vasc2 etc:1}  {Document your independent review of radiology images, and any outside records:1} {Document your discussion with family members, caretakers, and with consultants:1} {Document social determinants of health affecting pt's care:1} {Document your decision making why or why not admission, treatments were needed:1} Final Clinical Impression(s) / ED Diagnoses Final diagnoses:  Neutropenic fever ([place])    Rx / DC  Orders ED Discharge Orders     None

## 2022-08-30 NOTE — ED Notes (Signed)
Paged Dr Delton Coombes 2x to Dr Alvino Chapel. Goes to voicemail, left message to return call to [phone]

## 2022-08-31 DIAGNOSIS — C8338 Diffuse large B-cell lymphoma, lymph nodes of multiple sites: Secondary | ICD-10-CM

## 2022-08-31 DIAGNOSIS — K1230 Oral mucositis (ulcerative), unspecified: Secondary | ICD-10-CM

## 2022-08-31 DIAGNOSIS — D709 Neutropenia, unspecified: Secondary | ICD-10-CM | POA: Diagnosis not present

## 2022-08-31 DIAGNOSIS — R5081 Fever presenting with conditions classified elsewhere: Secondary | ICD-10-CM | POA: Diagnosis not present

## 2022-08-31 LAB — GLUCOSE, CAPILLARY
Glucose-Capillary: 110 mg/dL — ABNORMAL HIGH (ref 70–99)
Glucose-Capillary: 115 mg/dL — ABNORMAL HIGH (ref 70–99)
Glucose-Capillary: 117 mg/dL — ABNORMAL HIGH (ref 70–99)
Glucose-Capillary: 122 mg/dL — ABNORMAL HIGH (ref 70–99)
Glucose-Capillary: 125 mg/dL — ABNORMAL HIGH (ref 70–99)
Glucose-Capillary: 144 mg/dL — ABNORMAL HIGH (ref 70–99)

## 2022-08-31 LAB — COMPREHENSIVE METABOLIC PANEL
ALT: 68 U/L — ABNORMAL HIGH (ref 0–44)
AST: 42 U/L — ABNORMAL HIGH (ref 15–41)
Albumin: 3.5 g/dL (ref 3.5–5.0)
Alkaline Phosphatase: 56 U/L (ref 38–126)
Anion gap: 8 (ref 5–15)
BUN: 11 mg/dL (ref 8–23)
CO2: 25 mmol/L (ref 22–32)
Calcium: 8.4 mg/dL — ABNORMAL LOW (ref 8.9–10.3)
Chloride: 104 mmol/L (ref 98–111)
Creatinine, Ser: 0.81 mg/dL (ref 0.44–1.00)
GFR, Estimated: >60 mL/min (ref >60)
Glucose, Bld: 110 mg/dL — ABNORMAL HIGH (ref 70–99)
Potassium: 4.1 mmol/L (ref 3.5–5.1)
Sodium: 137 mmol/L (ref 135–145)
Total Bilirubin: 0.6 mg/dL (ref 0.3–1.2)
Total Protein: 6 g/dL — ABNORMAL LOW (ref 6.5–8.1)

## 2022-08-31 LAB — HEMOGLOBIN A1C
Hgb A1c MFr Bld: 5.9 % — ABNORMAL HIGH (ref 4.8–5.6)
Mean Plasma Glucose: 123 mg/dL

## 2022-08-31 LAB — CBC
HCT: 31.6 % — ABNORMAL LOW (ref 36.0–46.0)
Hemoglobin: 10.3 g/dL — ABNORMAL LOW (ref 12.0–15.0)
MCH: 28.5 pg (ref 26.0–34.0)
MCHC: 32.6 g/dL (ref 30.0–36.0)
MCV: 87.5 fL (ref 80.0–100.0)
Platelets: 91 10*3/uL — ABNORMAL LOW (ref 150–400)
RBC: 3.61 MIL/uL — ABNORMAL LOW (ref 3.87–5.11)
RDW: 12.5 % (ref 11.5–15.5)
WBC: 0.6 10*3/uL — CL (ref 4.0–10.5)
nRBC: 0 % (ref 0.0–0.2)

## 2022-08-31 LAB — CK: Total CK: 12 U/L — ABNORMAL LOW (ref 38–234)

## 2022-08-31 MED ORDER — INSULIN GLARGINE-YFGN 100 UNIT/ML ~~LOC~~ SOLN
10.0000 [IU] | Freq: Every day | SUBCUTANEOUS | Status: DC
  Administered 2022-08-31 – 2022-09-01 (×2): 10 [IU] via SUBCUTANEOUS
  Filled 2022-08-31 (×3): qty 0.1

## 2022-08-31 MED ORDER — HYDROCODONE-ACETAMINOPHEN 5-325 MG PO TABS: 1.0000 | ORAL_TABLET | Freq: Four times a day (QID) | ORAL | Status: DC | PRN

## 2022-08-31 MED ORDER — ORAL CARE MOUTH RINSE: 15.0000 mL | OROMUCOSAL | Status: DC | PRN

## 2022-08-31 MED ORDER — TBO-FILGRASTIM 300 MCG/0.5ML ~~LOC~~ SOSY
300.0000 ug | PREFILLED_SYRINGE | Freq: Once | SUBCUTANEOUS | Status: AC
  Administered 2022-08-31: 300 ug via SUBCUTANEOUS
  Filled 2022-08-31: qty 0.5

## 2022-08-31 MED ORDER — ACETAMINOPHEN 325 MG PO TABS
650.0000 mg | ORAL_TABLET | Freq: Four times a day (QID) | ORAL | Status: DC | PRN
  Administered 2022-08-31 – 2022-09-01 (×3): 650 mg via ORAL
  Filled 2022-08-31 (×3): qty 2

## 2022-08-31 MED ORDER — MEPERIDINE HCL 50 MG PO TABS: 50.0000 mg | ORAL_TABLET | ORAL | Status: DC | PRN

## 2022-08-31 MED ORDER — HYDROMORPHONE HCL 1 MG/ML IJ SOLN: 0.5000 mg | INTRAMUSCULAR | Status: DC | PRN

## 2022-08-31 MED ORDER — ONDANSETRON HCL 4 MG/2ML IJ SOLN
4.0000 mg | Freq: Four times a day (QID) | INTRAMUSCULAR | Status: DC | PRN
  Administered 2022-08-31: 4 mg via INTRAVENOUS
  Filled 2022-08-31: qty 2

## 2022-08-31 NOTE — Consult Note (Signed)
[facility] Consultation Oncology  Name: Debbie Hall      MRN: [id number]    Location: A307/A307-01  Date: 08/31/2022 Time:5:01 PM   REFERRING PHYSICIAN: Dr. Roger Shelter  REASON FOR CONSULT: Neutropenic fever in the setting of chemotherapy   DIAGNOSIS: Stage III large B-cell lymphoma  HISTORY OF PRESENT ILLNESS: Debbie Hall is a 61 year old female known to me from office visits.  She has received cycle 2 of Pola-R-CHP chemotherapy on 08/23/2022.  She called our office on 08/30/2022 that she is running a fever of 101, sore throat and generalized weakness.  She was told to come to the ER.  In the ER, she was found to have fever of 100.5.  At home she had 101.5.  Her white count was found to be low at 0.3.  Chest x-ray was negative for any acute findings.  COVID-19 test was negative.  She was started on broad-spectrum antibiotics and admitted to the hospital.  She complains of soreness in the mouth which is preventing her from eating.  Denies any nausea or vomiting.  PAST MEDICAL HISTORY:   Past Medical History:  Diagnosis Date   Anemia    Anxiety    Diabetes mellitus ([place])    Fatty liver    GERD (gastroesophageal reflux disease)    Gout    Hyperlipidemia    Hypertension    Migraines    Pneumonia    as a child   Port-A-Cath in place 07/28/2022    ALLERGIES: Allergies  Allergen Reactions   Avocado Itching   Banana Itching   Latex Itching    Eyes watering, itching.   Lisinopril     angioedema   Penicillins     Facial swelling   Rituxan [Rituximab]     Itching in her throat    Vancomycin Itching      MEDICATIONS: I have reviewed the patient's current medications.     PAST SURGICAL HISTORY Past Surgical History:  Procedure Laterality Date   ABDOMINAL HYSTERECTOMY     partial   CESAREAN SECTION     x 2   TONSILLECTOMY      FAMILY HISTORY: Family History  Problem Relation Age of Onset   Hypertension Mother    Heart disease Mother    Heart attack Mother     Pancreatic cancer Mother    Prostate cancer Father    Colon cancer Neg Hx    Esophageal cancer Neg Hx    Rectal cancer Neg Hx     SOCIAL HISTORY:  reports that she has never smoked. She has never used smokeless tobacco. She reports current alcohol use. She reports that she does not use drugs.  PERFORMANCE STATUS: The patient's performance status is 2 - Symptomatic, <50% confined to bed  PHYSICAL EXAM: Most Recent Vital Signs: Blood pressure 129/60, pulse 90, temperature 98.5 F (36.9 C), temperature source Oral, resp. rate 20, height '5\' 5"'$  (1.651 m), weight 167 lb 12.3 oz (76.1 kg), SpO2 99 %. BP 129/60 (BP Location: Left Arm)   Pulse 90   Temp 98.5 F (36.9 C) (Oral)   Resp 20   Ht '5\' 5"'$  (1.651 m)   Wt 167 lb 12.3 oz (76.1 kg)   SpO2 99%   BMI 27.92 kg/m  General appearance: alert, cooperative, and appears stated age Lungs: clear to auscultation bilaterally Heart: regular rate and rhythm Extremities:  No edema or cyanosis.  LABORATORY DATA:  Results for orders placed or performed during the hospital encounter of 08/30/22 (from  the past 48 hour(s))  Resp Panel by RT-PCR (Flu A&B, Covid) Anterior Nasal Swab     Status: None   Collection Time: 08/30/22 11:18 AM   Specimen: Anterior Nasal Swab  Result Value Ref Range   SARS Coronavirus 2 by RT PCR NEGATIVE NEGATIVE    Comment: (NOTE) SARS-CoV-2 target nucleic acids are NOT DETECTED.  The SARS-CoV-2 RNA is generally detectable in upper respiratory specimens during the acute phase of infection. The lowest concentration of SARS-CoV-2 viral copies this assay can detect is 138 copies/mL. A negative result does not preclude SARS-Cov-2 infection and should not be used as the sole basis for treatment or other patient management decisions. A negative result may occur with  improper specimen collection/handling, submission of specimen other than nasopharyngeal swab, presence of viral mutation(s) within the areas targeted by this  assay, and inadequate number of viral copies(<138 copies/mL). A negative result must be combined with clinical observations, patient history, and epidemiological information. The expected result is Negative.  Fact Sheet for Patients:  EntrepreneurPulse.com.au  Fact Sheet for Healthcare Providers:  IncredibleEmployment.be  This test is no t yet approved or cleared by the [place] FDA and  has been authorized for detection and/or diagnosis of SARS-CoV-2 by FDA under an Emergency Use Authorization (EUA). This EUA will remain  in effect (meaning this test can be used) for the duration of the COVID-19 declaration under Section 564(b)(1) of the Act, 21 U.S.C.section 360bbb-3(b)(1), unless the authorization is terminated  or revoked sooner.       Influenza A by PCR NEGATIVE NEGATIVE   Influenza B by PCR NEGATIVE NEGATIVE    Comment: (NOTE) The Xpert Xpress SARS-CoV-2/FLU/RSV plus assay is intended as an aid in the diagnosis of influenza from Nasopharyngeal swab specimens and should not be used as a sole basis for treatment. Nasal washings and aspirates are unacceptable for Xpert Xpress SARS-CoV-2/FLU/RSV testing.  Fact Sheet for Patients: EntrepreneurPulse.com.au  Fact Sheet for Healthcare Providers: IncredibleEmployment.be  This test is not yet approved or cleared by the [place] FDA and has been authorized for detection and/or diagnosis of SARS-CoV-2 by FDA under an Emergency Use Authorization (EUA). This EUA will remain in effect (meaning this test can be used) for the duration of the COVID-19 declaration under Section 564(b)(1) of the Act, 21 U.S.C. section 360bbb-3(b)(1), unless the authorization is terminated or revoked.  Performed at [facility], [address], [place], [place] [address]   Culture, blood (routine x 2)     Status: None (Preliminary result)   Collection Time: 08/30/22 11:25  AM   Specimen: BLOOD  Result Value Ref Range   Specimen Description BLOOD RIGHT ANTECUBITAL    Special Requests      BOTTLES DRAWN AEROBIC AND ANAEROBIC Blood Culture adequate volume   Culture      NO GROWTH < 24 HOURS Performed at [facility], [address] [place], [place] [address]    Report Status PENDING   Comprehensive metabolic panel     Status: Abnormal   Collection Time: 08/30/22 11:25 AM  Result Value Ref Range   Sodium 137 135 - 145 mmol/L   Potassium 3.9 3.5 - 5.1 mmol/L   Chloride 103 98 - 111 mmol/L   CO2 23 22 - 32 mmol/L   Glucose, Bld 128 (H) 70 - 99 mg/dL    Comment: Glucose reference range applies only to samples taken after fasting for at least 8 hours.   BUN 12 8 - 23 mg/dL  Creatinine, Ser 0.74 0.44 - 1.00 mg/dL   Calcium 8.9 8.9 - 10.3 mg/dL   Total Protein 6.7 6.5 - 8.1 g/dL   Albumin 4.0 3.5 - 5.0 g/dL   AST 51 (H) 15 - 41 U/L   ALT 73 (H) 0 - 44 U/L   Alkaline Phosphatase 71 38 - 126 U/L   Total Bilirubin 0.8 0.3 - 1.2 mg/dL   GFR, Estimated >60 >60 mL/min    Comment: (NOTE) Calculated using the CKD-EPI Creatinine Equation (2021)    Anion gap 11 5 - 15    Comment: Performed at [facility], [address], [place], [place] [address]  Lactic acid, plasma     Status: Abnormal   Collection Time: 08/30/22 11:25 AM  Result Value Ref Range   Lactic Acid, Venous 2.4 (HH) 0.5 - 1.9 mmol/L    Comment: CRITICAL RESULT CALLED TO, READ BACK BY AND VERIFIED WITH: CHILDRESS,W AT 12:10PM ON 08/30/22 BY [facility] Performed at [facility], [address], [place], [place] [address]   CBC with Differential     Status: Abnormal   Collection Time: 08/30/22 11:25 AM  Result Value Ref Range   WBC 0.3 (LL) 4.0 - 10.5 K/uL    Comment: This critical result has verified and been called to [place] by Rozelle Logan on 12 05 2023 at 1226, and has been read back. CRITICAL RESULTS VERIFIED   RBC 4.08 3.87 - 5.11 MIL/uL   Hemoglobin 11.7 (L) 12.0 - 15.0 g/dL    HCT 35.3 (L) 36.0 - 46.0 %   MCV 86.5 80.0 - 100.0 fL   MCH 28.7 26.0 - 34.0 pg   MCHC 33.1 30.0 - 36.0 g/dL   RDW 12.7 11.5 - 15.5 %   Platelets 126 (L) 150 - 400 K/uL   nRBC 0.0 0.0 - 0.2 %   Neutrophils Relative % 6 %   Neutro Abs 0.0 (LL) 1.7 - 7.7 K/uL    Comment: This critical result has verified and been called to [place] by Rozelle Logan on 12 05 2023 at 1226, and has been read back. CRITICAL RESULTS VERIFIED   Lymphocytes Relative 35 %   Lymphs Abs 0.1 (L) 0.7 - 4.0 K/uL   Monocytes Relative 18 %   Monocytes Absolute 0.1 0.1 - 1.0 K/uL   Eosinophils Relative 35 %   Eosinophils Absolute 0.1 0.0 - 0.5 K/uL   Basophils Relative 6 %   Basophils Absolute 0.0 0.0 - 0.1 K/uL   RBC Morphology MORPHOLOGY UNREMARKABLE    Smear Review MORPHOLOGY UNREMARKABLE    Immature Granulocytes 0 %   Abs Immature Granulocytes 0.00 0.00 - 0.07 K/uL    Comment: Performed at [facility], [address] [place], [place] [address]  Urinalysis, Routine w reflex microscopic Urine, Clean Catch     Status: None   Collection Time: 08/30/22 11:25 AM  Result Value Ref Range   Color, Urine YELLOW YELLOW   APPearance CLEAR CLEAR   Specific Gravity, Urine 1.012 1.005 - 1.030   pH 6.0 5.0 - 8.0   Glucose, UA NEGATIVE NEGATIVE mg/dL   Hgb urine dipstick NEGATIVE NEGATIVE   Bilirubin Urine NEGATIVE NEGATIVE   Ketones, ur NEGATIVE NEGATIVE mg/dL   Protein, ur NEGATIVE NEGATIVE mg/dL   Nitrite NEGATIVE NEGATIVE   Leukocytes,Ua NEGATIVE NEGATIVE    Comment: Performed at [facility], [address], [place], [place] [address]  Culture, blood (routine x 2)     Status: None (Preliminary result)  Collection Time: 08/30/22 12:13 PM   Specimen: BLOOD  Result Value Ref Range   Specimen Description BLOOD LEFT ANTECUBITAL    Special Requests      BOTTLES DRAWN AEROBIC AND ANAEROBIC Blood Culture results may not be optimal due to an excessive volume of blood received in culture bottles   Culture      NO  GROWTH < 24 HOURS Performed at [facility], [address] [place], [place] [address]    Report Status PENDING   Lactic acid, plasma     Status: Abnormal   Collection Time: 08/30/22  1:36 PM  Result Value Ref Range   Lactic Acid, Venous 2.5 (HH) 0.5 - 1.9 mmol/L    Comment: CRITICAL RESULT CALLED TO, READ BACK BY AND VERIFIED WITH: W.Childress RN  08/30/22 at 14:12  R.Byrd Performed at [facility], [address], [place], [place] [address]   Glucose, capillary     Status: Abnormal   Collection Time: 08/31/22 12:09 AM  Result Value Ref Range   Glucose-Capillary 110 (H) 70 - 99 mg/dL    Comment: Glucose reference range applies only to samples taken after fasting for at least 8 hours.  Glucose, capillary     Status: Abnormal   Collection Time: 08/31/22  4:53 AM  Result Value Ref Range   Glucose-Capillary 115 (H) 70 - 99 mg/dL    Comment: Glucose reference range applies only to samples taken after fasting for at least 8 hours.  Comprehensive metabolic panel     Status: Abnormal   Collection Time: 08/31/22  4:59 AM  Result Value Ref Range   Sodium 137 135 - 145 mmol/L   Potassium 4.1 3.5 - 5.1 mmol/L   Chloride 104 98 - 111 mmol/L   CO2 25 22 - 32 mmol/L   Glucose, Bld 110 (H) 70 - 99 mg/dL    Comment: Glucose reference range applies only to samples taken after fasting for at least 8 hours.   BUN 11 8 - 23 mg/dL   Creatinine, Ser 0.81 0.44 - 1.00 mg/dL   Calcium 8.4 (L) 8.9 - 10.3 mg/dL   Total Protein 6.0 (L) 6.5 - 8.1 g/dL   Albumin 3.5 3.5 - 5.0 g/dL   AST 42 (H) 15 - 41 U/L   ALT 68 (H) 0 - 44 U/L   Alkaline Phosphatase 56 38 - 126 U/L   Total Bilirubin 0.6 0.3 - 1.2 mg/dL   GFR, Estimated >60 >60 mL/min    Comment: (NOTE) Calculated using the CKD-EPI Creatinine Equation (2021)    Anion gap 8 5 - 15    Comment: Performed at [facility], [address], [place], [place] [address]  CBC     Status: Abnormal   Collection Time: 08/31/22  4:59 AM  Result Value Ref Range    WBC 0.6 (LL) 4.0 - 10.5 K/uL    Comment: CRITICAL VALUE NOTED.  VALUE IS CONSISTENT WITH PREVIOUSLY REPORTED AND CALLED VALUE. WHITE COUNT CONFIRMED ON SMEAR THIS CRITICAL RESULT HAS VERIFIED AND BEEN CALLED TO [place],B BY BOBBIE MATTHEWS ON 12 06 2023 AT 0619, AND HAS BEEN READ BACK.     RBC 3.61 (L) 3.87 - 5.11 MIL/uL   Hemoglobin 10.3 (L) 12.0 - 15.0 g/dL   HCT 31.6 (L) 36.0 - 46.0 %   MCV 87.5 80.0 - 100.0 fL   MCH 28.5 26.0 - 34.0 pg   MCHC 32.6 30.0 - 36.0 g/dL   RDW 12.5 11.5 - 15.5 %  Platelets 91 (L) 150 - 400 K/uL    Comment: SPECIMEN CHECKED FOR CLOTS Immature Platelet Fraction may be clinically indicated, consider ordering this additional test [id number] PLATELET COUNT CONFIRMED BY SMEAR    nRBC 0.0 0.0 - 0.2 %    Comment: Performed at [facility], [address], [place], [place] [address]  CK     Status: Abnormal   Collection Time: 08/31/22  4:59 AM  Result Value Ref Range   Total CK 12 (L) 38 - 234 U/L    Comment: Performed at [facility], [address], [place], [place] [address]  Glucose, capillary     Status: Abnormal   Collection Time: 08/31/22  7:45 AM  Result Value Ref Range   Glucose-Capillary 117 (H) 70 - 99 mg/dL    Comment: Glucose reference range applies only to samples taken after fasting for at least 8 hours.  Glucose, capillary     Status: Abnormal   Collection Time: 08/31/22 11:28 AM  Result Value Ref Range   Glucose-Capillary 122 (H) 70 - 99 mg/dL    Comment: Glucose reference range applies only to samples taken after fasting for at least 8 hours.  Glucose, capillary     Status: Abnormal   Collection Time: 08/31/22  4:14 PM  Result Value Ref Range   Glucose-Capillary 125 (H) 70 - 99 mg/dL    Comment: Glucose reference range applies only to samples taken after fasting for at least 8 hours.      RADIOGRAPHY: DG Chest Portable 1 View  Result Date: 08/30/2022 CLINICAL DATA:  Fever, body aches, ongoing chemotherapy EXAM: PORTABLE CHEST 1 VIEW  COMPARISON:  07/29/2022 FINDINGS: Mild cardiomegaly. Right chest port catheter. Both lungs are clear. The visualized skeletal structures are unremarkable. IMPRESSION: No acute abnormality of the lungs in AP portable projection. Electronically Signed   By: Delanna Ahmadi M.D.   On: 08/30/2022 11:22         ASSESSMENT and PLAN:  1.  Neutropenic fever: - Continue broad-spectrum antibiotics daptomycin and Merrem. - Chest x-ray reviewed by me did not show any infiltrates.  UA was negative.  Follow-up on blood cultures. - CBC today shows white count 0.6, up from 0.3 yesterday.  She is receiving short acting G-CSF. - If afebrile for 24-48 hours and blood cultures negative, consider switching to oral antibiotics prior to discharge home.  2.  Stage III large B-cell lymphoma: - Cycle 2 chemotherapy Pola-R-CHP on 08/23/2022. - She has received Neulasta.  3.  TLS prophylaxis: - Continue allopurinol.  4.  Anxiety: - Continue Xanax and Lexapro.  5.  Mucositis: - Continue Carafate with lidocaine every 6 hours as needed.  All questions were answered. The patient knows to call the clinic with any problems, questions or concerns. We can certainly see the patient much sooner if necessary.   Derek Jack

## 2022-08-31 NOTE — Progress Notes (Signed)
PROGRESS NOTE    Patient: Debbie Hall                            PCP: Tempie Hoist, FNP                    DOB: [birth year]            DOA: 08/30/2022 ATF:[id number]             DOS: 08/31/2022, 11:46 AM   LOS: 1 day   Date of Service: The patient was seen and examined on 08/31/2022  Subjective:   The patient was seen and examined this morning. Stable at this time. complaining of headaches Otherwise no issues overnight .  Brief Narrative:   Debbie Hall is a 61 year old female with a history of hypertension, hyperlipidemia, anxiety, diabetes mellitus type 2 presenting with myalgias, arthralgias, and fever up to 101.5 F at home on the evening of 08/29/2022.  The patient has a history of diffuse large B-cell lymphoma for which she last received chemotherapy on 08/23/2022.  The patient stated that she had received Neulasta along with her chemotherapy.  She denies any headache, neck pain, chest pain, cough, hemoptysis, shortness of breath, nausea, vomiting, diarrhea, abdominal pain, dysuria, hematuria.  She denies any unusual rashes or synovitis.  She denies any new medications or over-the-counter supplements.  ED: Temp; 100.5 F. saturation 98% on room air.  WBC 0.3, hemoglobin 11.7, platelets 126,000.  Sodium 137, potassium 3.9, bicarbonate 23, BUN 12, creatinine 0.74.  AST 51, ALT 73, alk phosphatase 71, total bilirubin 0.8.  Lactic acid peaked at 2.5.  Blood cultures were obtained.  Chest x-ray was negative for any acute findings.  COVID-19 PCR was Negative.  The patient was started on Cubicin and Merrem.     Assessment & Plan:   Principal Problem:   Febrile neutropenia (HCC) Active Problems:   Diffuse large B cell lymphoma (HCC)   Essential hypertension   Febrile neutropenia This morning vitals:  BP 132/69, HR 90, temp. 99.6 F (37.6 C), RR  20,  SpO2 97 % on RA   -ANC 18 -start Leukine -start daptomycin (Vanc allergy) -start merrem - reviewed chest x-ray--no  infiltrates or edema -UA negative for pyuria -Follow blood cultures: NGTD    Diffuse large B-cell lymphoma -Follows Dr. Delton Coombes -Last chemotherapy ([facility]) 08/23/2022 -Continue allopurinol   Essential hypertension -Holding metoprolol secondary to soft blood pressure   Anxiety Restart home dose alprazolam -PDMP reviewed--patient receives alprazolam 1 mg #90, last refill 08/24/2022 -Continue Lexapro   Hypothyroidism -Continue Synthroid   Personal hx of PCN allergy -questionable history -her facial swelling was also in setting of lisinopril use -discussed risks/benefits/alternatives of using carbapenem with pt including but not limited to anaphylaxis.  She is willing to try carbapenem   Headaches As needed Tylenol, Vicodin, Will dose of Dilaudid for severe pain     Nutritional status:  The patient's BMI is: Body mass index is 27.92 kg/m. I agree with the assessment and plan as outlined below: ------------------------------------------------------------------------------------------------------------------------------------------------- Cultures; Blood Cultures x 2 >> NGT Urine Culture  >>> NGT  Sputum Culture >> NGT         ------------------------------------------------------------------------------------------------------------------------------------------------  DVT prophylaxis:  heparin injection 5,000 Units Start: 08/30/22 2200   Code Status:   Code Status: Full Code  Family Communication: No family member present at bedside- attempt will be made to update daily The above findings and plan of  care has been discussed with patient (and family)  in detail,  they expressed understanding and agreement of above. -Advance care planning has been discussed.   Admission status:   Status is: Inpatient Remains inpatient appropriate because: Ruling out sepsis due to neutropenic fever, needing  IV fluids IV antibiotics     Procedures:   No admission  procedures for hospital encounter.   Antimicrobials:  Anti-infectives (From admission, onward)    Start     Dose/Rate Route Frequency Ordered Stop   08/31/22 2000  DAPTOmycin (CUBICIN) 450 mg in sodium chloride 0.9 % IVPB        8 mg/kg  57 kg (Ideal) 118 mL/hr over 30 Minutes Intravenous Daily 08/30/22 1840     08/30/22 1630  DAPTOmycin (CUBICIN) 450 mg in sodium chloride 0.9 % IVPB  Status:  Discontinued        8 mg/kg  57 kg (Ideal) 118 mL/hr over 30 Minutes Intravenous Daily 08/30/22 1525 08/30/22 1840   08/30/22 1600  meropenem (MERREM) 1 g in sodium chloride 0.9 % 100 mL IVPB        1 g 200 mL/hr over 30 Minutes Intravenous Every 8 hours 08/30/22 1529     08/30/22 1500  linezolid (ZYVOX) IVPB 600 mg  Status:  Discontinued        600 mg 300 mL/hr over 60 Minutes Intravenous Every 12 hours 08/30/22 1433 08/30/22 1519   08/30/22 1400  aztreonam (AZACTAM) 2 g in sodium chloride 0.9 % 100 mL IVPB  Status:  Discontinued        2 g 200 mL/hr over 30 Minutes Intravenous Every 8 hours 08/30/22 1240 08/30/22 1520        Medication:   allopurinol  300 mg Oral Daily   citalopram  5 mg Oral Daily   heparin  5,000 Units Subcutaneous Q8H   levothyroxine  50 mcg Oral Q0600   pantoprazole  40 mg Oral Daily   sucralfate  1 g Oral Q6H   Tbo-Filgrastim  300 mcg Subcutaneous Once    acetaminophen, ALPRAZolam, HYDROcodone-acetaminophen, HYDROmorphone (DILAUDID) injection, lidocaine, ondansetron (ZOFRAN) IV, mouth rinse   Objective:   Vitals:   08/30/22 1833 08/30/22 2225 08/31/22 0220 08/31/22 0628  BP: 121/75 (!) 114/55 (!) 102/55 132/69  Pulse: 85 96 94 90  Resp: _0 Temp: 98.9 F (37.2 C) 99.6 F (37.6 C) 99.9 F (37.7 C) 99.6 F (37.6 C)  TempSrc: Oral Oral Oral Oral  SpO2:  97% 97% 97%  Weight: 76.1 kg     Height: _1  (1.651 m)       Intake/Output Summary (Last 24 hours) at 08/31/2022 1146 Last data filed at 08/31/2022 0300 Gross per 24 hour  Intake 200.54  ml  Output --  Net 200.54 ml   Filed Weights   08/30/22 1833  Weight: 76.1 kg     Examination:   Physical Exam  Constitution:  Alert, cooperative, no distress,  Appears calm and comfortable  Psychiatric:   Normal and stable mood and affect, cognition intact,   HEENT:        Normocephalic, PERRL, otherwise with in Normal limits  Chest:         Chest symmetric Cardio vascular:  S1/S2, RRR, No murmure, No Rubs or Gallops  pulmonary: Clear to auscultation bilaterally, respirations unlabored, negative wheezes / crackles Abdomen: Soft, non-tender, non-distended, bowel sounds,no masses, no organomegaly Muscular skeletal: Limited exam - in bed, able to move all 4 extremities,  Neuro: CNII-XII intact. , normal motor and sensation, reflexes intact  Extremities: No pitting edema lower extremities, +2 pulses  Skin: Dry, warm to touch, negative for any Rashes, No open wounds Wounds: per nursing documentation   ------------------------------------------------------------------------------------------------------------------------------------------    LABs:     Latest Ref Rng & Units 08/31/2022    4:59 AM 08/30/2022   11:25 AM 08/23/2022    7:54 AM  CBC  WBC 4.0 - 10.5 K/uL 0.6  0.3  6.8   Hemoglobin 12.0 - 15.0 g/dL 10.3  11.7  12.3   Hematocrit 36.0 - 46.0 % 31.6  35.3  37.5   Platelets 150 - 400 K/uL 91  126  392       Latest Ref Rng & Units 08/31/2022    4:59 AM 08/30/2022   11:25 AM 08/23/2022    7:54 AM  CMP  Glucose 70 - 99 mg/dL 110  128  133   BUN 8 - 23 mg/dL _0 Creatinine 0.44 - 1.00 mg/dL 0.81  0.74  0.92   Sodium 135 - 145 mmol/L 137  137  137   Potassium 3.5 - 5.1 mmol/L 4.1  3.9  4.5   Chloride 98 - 111 mmol/L 104  103  106   CO2 22 - 32 mmol/L _1 Calcium 8.9 - 10.3 mg/dL 8.4  8.9  8.7   Total Protein 6.5 - 8.1 g/dL 6.0  6.7  6.5   Total Bilirubin 0.3 - 1.2 mg/dL 0.6  0.8  0.3   Alkaline Phos 38 - 126 U/L 56  71  64   AST 15 - 41 U/L 42  51   68   ALT 0 - 44 U/L 68  73  67        Micro Results Recent Results (from the past 240 hour(s))  Resp Panel by RT-PCR (Flu A&B, Covid) Anterior Nasal Swab     Status: None   Collection Time: 08/30/22 11:18 AM   Specimen: Anterior Nasal Swab  Result Value Ref Range Status   SARS Coronavirus 2 by RT PCR NEGATIVE NEGATIVE Final    Comment: (NOTE) SARS-CoV-2 target nucleic acids are NOT DETECTED.  The SARS-CoV-2 RNA is generally detectable in upper respiratory specimens during the acute phase of infection. The lowest concentration of SARS-CoV-2 viral copies this assay can detect is 138 copies/mL. A negative result does not preclude SARS-Cov-2 infection and should not be used as the sole basis for treatment or other patient management decisions. A negative result may occur with  improper specimen collection/handling, submission of specimen other than nasopharyngeal swab, presence of viral mutation(s) within the areas targeted by this assay, and inadequate number of viral copies(<138 copies/mL). A negative result must be combined with clinical observations, patient history, and epidemiological information. The expected result is Negative.  Fact Sheet for Patients:  EntrepreneurPulse.com.au  Fact Sheet for Healthcare Providers:  IncredibleEmployment.be  This test is no t yet approved or cleared by the [place] FDA and  has been authorized for detection and/or diagnosis of SARS-CoV-2 by FDA under an Emergency Use Authorization (EUA). This EUA will remain  in effect (meaning this test can be used) for the duration of the COVID-19 declaration under Section 564(b)(1) of the Act, 21 U.S.C.section 360bbb-3(b)(1), unless the authorization is terminated  or revoked sooner.       Influenza A by PCR NEGATIVE NEGATIVE Final   Influenza B by PCR NEGATIVE NEGATIVE Final  Comment: (NOTE) The Xpert Xpress SARS-CoV-2/FLU/RSV plus assay is intended as  an aid in the diagnosis of influenza from Nasopharyngeal swab specimens and should not be used as a sole basis for treatment. Nasal washings and aspirates are unacceptable for Xpert Xpress SARS-CoV-2/FLU/RSV testing.  Fact Sheet for Patients: EntrepreneurPulse.com.au  Fact Sheet for Healthcare Providers: IncredibleEmployment.be  This test is not yet approved or cleared by the [place] FDA and has been authorized for detection and/or diagnosis of SARS-CoV-2 by FDA under an Emergency Use Authorization (EUA). This EUA will remain in effect (meaning this test can be used) for the duration of the COVID-19 declaration under Section 564(b)(1) of the Act, 21 U.S.C. section 360bbb-3(b)(1), unless the authorization is terminated or revoked.  Performed at [facility], [address], [place], [place] [address]   Culture, blood (routine x 2)     Status: None (Preliminary result)   Collection Time: 08/30/22 11:25 AM   Specimen: BLOOD  Result Value Ref Range Status   Specimen Description BLOOD RIGHT ANTECUBITAL  Final   Special Requests   Final    BOTTLES DRAWN AEROBIC AND ANAEROBIC Blood Culture adequate volume   Culture   Final    NO GROWTH < 24 HOURS Performed at [facility], [address], [place], [place] [address]    Report Status PENDING  Incomplete  Culture, blood (routine x 2)     Status: None (Preliminary result)   Collection Time: 08/30/22 12:13 PM   Specimen: BLOOD  Result Value Ref Range Status   Specimen Description BLOOD LEFT ANTECUBITAL  Final   Special Requests   Final    BOTTLES DRAWN AEROBIC AND ANAEROBIC Blood Culture results may not be optimal due to an excessive volume of blood received in culture bottles   Culture   Final    NO GROWTH < 24 HOURS Performed at [facility], [address], [place], [place] [address]    Report Status PENDING  Incomplete    Radiology Reports No results found.  SIGNED: Deatra James,  MD, FHM. [facility],  Pager (please use [email or web address] to page/text) Please use Epic Secure Chat for non-urgent communication (7AM-7PM)  If 7PM-7AM, please contact night-coverage [email or web address], 08/31/2022, 11:46 AM

## 2022-08-31 NOTE — Progress Notes (Signed)
   08/31/22 2500  Provider Notification  Provider Name/Title Josephine Cables  Date Provider Notified 08/31/22  Time Provider Notified [phone]  Method of Notification Page  Notification Reason Critical Result  Test performed and critical result WBC 0.62  Date Critical Result Received 08/31/22  Time Critical Result Received 0620  Provider response No new orders

## 2022-08-31 NOTE — TOC Progression Note (Addendum)
  Transition of Care [facility]) Screening Note   Patient Details  Name: Debbie Hall Date of Birth: [birth year]   Transition of [facility]) CM/SW Contact:    Boneta Lucks, RN Phone Number: 08/31/2022, 10:55 AM  Lives in [place],  [place] consulted,TOC will  follow for DC needs   Debbie Hall from [facility] left message with contact information for any discharge needs DME/HH... [phone] Ext [phone] or main number [phone]  Transition of Care Department [facility]) has reviewed patient and no TOC needs have been identified at this time. We will continue to monitor patient advancement through interdisciplinary progression rounds. If new patient transition needs arise, please place a TOC consult.      Barriers to Discharge: Continued Medical Work up  Expected Discharge Plan and Services         Living arrangements for the past 2 months: [place]

## 2022-08-31 NOTE — Progress Notes (Signed)
Patient slept on and off throughout this shift, she did complain of nausea this morning, prn zofran administered. No complaints of pain or discomfort at this time.

## 2022-09-01 DIAGNOSIS — R5081 Fever presenting with conditions classified elsewhere: Secondary | ICD-10-CM | POA: Diagnosis not present

## 2022-09-01 DIAGNOSIS — D709 Neutropenia, unspecified: Secondary | ICD-10-CM | POA: Diagnosis not present

## 2022-09-01 LAB — COMPREHENSIVE METABOLIC PANEL
ALT: 72 U/L — ABNORMAL HIGH (ref 0–44)
AST: 46 U/L — ABNORMAL HIGH (ref 15–41)
Albumin: 3.4 g/dL — ABNORMAL LOW (ref 3.5–5.0)
Alkaline Phosphatase: 55 U/L (ref 38–126)
Anion gap: 7 (ref 5–15)
BUN: 12 mg/dL (ref 8–23)
CO2: 26 mmol/L (ref 22–32)
Calcium: 8.5 mg/dL — ABNORMAL LOW (ref 8.9–10.3)
Chloride: 108 mmol/L (ref 98–111)
Creatinine, Ser: 0.83 mg/dL (ref 0.44–1.00)
GFR, Estimated: >60 mL/min (ref >60)
Glucose, Bld: 104 mg/dL — ABNORMAL HIGH (ref 70–99)
Potassium: 3.9 mmol/L (ref 3.5–5.1)
Sodium: 141 mmol/L (ref 135–145)
Total Bilirubin: 0.3 mg/dL (ref 0.3–1.2)
Total Protein: 6 g/dL — ABNORMAL LOW (ref 6.5–8.1)

## 2022-09-01 LAB — CBC
HCT: 31.9 % — ABNORMAL LOW (ref 36.0–46.0)
Hemoglobin: 10.4 g/dL — ABNORMAL LOW (ref 12.0–15.0)
MCH: 28.8 pg (ref 26.0–34.0)
MCHC: 32.6 g/dL (ref 30.0–36.0)
MCV: 88.4 fL (ref 80.0–100.0)
Platelets: 69 10*3/uL — ABNORMAL LOW (ref 150–400)
RBC: 3.61 MIL/uL — ABNORMAL LOW (ref 3.87–5.11)
RDW: 12.3 % (ref 11.5–15.5)
WBC: 3.2 10*3/uL — ABNORMAL LOW (ref 4.0–10.5)
nRBC: 0 % (ref 0.0–0.2)

## 2022-09-01 LAB — GLUCOSE, CAPILLARY
Glucose-Capillary: 111 mg/dL — ABNORMAL HIGH (ref 70–99)
Glucose-Capillary: 114 mg/dL — ABNORMAL HIGH (ref 70–99)
Glucose-Capillary: 126 mg/dL — ABNORMAL HIGH (ref 70–99)
Glucose-Capillary: 128 mg/dL — ABNORMAL HIGH (ref 70–99)

## 2022-09-01 MED ORDER — LACTINEX PO CHEW: 1.0000 | CHEWABLE_TABLET | Freq: Three times a day (TID) | ORAL | 0 refills | Status: AC

## 2022-09-01 MED ORDER — LEVOFLOXACIN 750 MG PO TABS: 750.0000 mg | ORAL_TABLET | Freq: Every day | ORAL | 0 refills | Status: AC

## 2022-09-01 MED ORDER — TBO-FILGRASTIM 300 MCG/0.5ML ~~LOC~~ SOSY
300.0000 ug | PREFILLED_SYRINGE | Freq: Once | SUBCUTANEOUS | Status: AC
  Administered 2022-09-01: 300 ug via SUBCUTANEOUS
  Filled 2022-09-01: qty 0.5

## 2022-09-01 NOTE — Progress Notes (Signed)
Patient slept throughout this shift, no complaints of pain or discomfort at this time. 

## 2022-09-01 NOTE — Discharge Summary (Signed)
Physician Discharge Summary   Patient: Debbie Hall MRN: [id number] DOB: September 12, 1961  Admit date:     08/30/2022  Discharge date: 09/01/22  Discharge Physician: Deatra James   PCP: Tempie Hoist, FNP   Recommendations at discharge:   Follow-up with Dr. Delton Coombes within 1 week, follow-up with the final blood cultures Continue empiric antibiotics which were switched to p.o. Levaquin for 7 more days  Discharge Diagnoses: Principal Problem:   Febrile neutropenia ([place]) Active Problems:   Diffuse large B cell lymphoma (HCC)   Essential hypertension   Mucositis  Resolved Problems:   * No resolved hospital problems. *  Hospital Course: Debbie Hall is a 61 year old female with a history of hypertension, hyperlipidemia, anxiety, diabetes mellitus type 2 presenting with myalgias, arthralgias, and fever up to 101.5 F at home on the evening of 08/29/2022.  The patient has a history of diffuse large B-cell lymphoma for which she last received chemotherapy on 08/23/2022.  The patient stated that she had received Neulasta along with her chemotherapy.  She denies any headache, neck pain, chest pain, cough, hemoptysis, shortness of breath, nausea, vomiting, diarrhea, abdominal pain, dysuria, hematuria.  She denies any unusual rashes or synovitis.  She denies any new medications or over-the-counter supplements.  ED: Temp; 100.5 F. saturation 98% on room air.  WBC 0.3, hemoglobin 11.7, platelets 126,000.  Sodium 137, potassium 3.9, bicarbonate 23, BUN 12, creatinine 0.74.  AST 51, ALT 73, alk phosphatase 71, total bilirubin 0.8.  Lactic acid peaked at 2.5.  Blood cultures were obtained.  Chest x-ray was negative for any acute findings.  COVID-19 PCR was Negative.  The patient was started on Cubicin and Merrem.  Principal Problem:   Febrile neutropenia (HCC) Active Problems:   Diffuse large B cell lymphoma (HCC)   Essential hypertension   Febrile neutropenia Blood pressure (!) 101/56,  pulse 81, temperature 97.7 F (36.5 C), temperature source Oral, resp. rate 20, height _0  (1.651 m), weight 76.1 kg, SpO2 98 %. Remained afebrile, mildly hypertensive, improved WBC  -ANC 18 -Has been on IV antibiotics of daptomycin (Vanc allergy) and Merrem-since admission will be discontinued today 09/01/2022, switch to p.o. Levaquin 750 mg daily x 7 more days -Cultures reviewed, negative to date   - reviewed chest x-ray--no infiltrates or edema -UA negative for pyuria    Neutropenia  -Improved with Granix    Latest Ref Rng & Units 09/01/2022    5:37 AM 08/31/2022    4:59 AM 08/30/2022   11:25 AM  CBC  WBC 4.0 - 10.5 K/uL 3.2  0.6  0.3   Hemoglobin 12.0 - 15.0 g/dL 10.4  10.3  11.7   Hematocrit 36.0 - 46.0 % 31.9  31.6  35.3   Platelets 150 - 400 K/uL 69  91  126      diffuse large B-cell lymphoma -Follows Dr. Delton Coombes -Last chemotherapy (Pola-R-CHP) 08/23/2022 -Continue allopurinol   Essential hypertension -Holding metoprolol secondary to soft blood pressure   Anxiety Restart home dose alprazolam -PDMP reviewed--patient receives alprazolam 1 mg #90, last refill 08/24/2022 -Continue Lexapro   Hypothyroidism -Continue Synthroid   Personal hx of PCN allergy -questionable history -her facial swelling was also in setting of lisinopril use -discussed risks/benefits/alternatives of using carbapenem with pt including but not limited to anaphylaxis.  She is willing to try carbapenem   Headaches As needed Tylenol, Vicodin, Will dose of Dilaudid for severe pain    Consultants: Dr. Delton Coombes  Disposition: Home Diet recommendation:  Discharge Diet  Orders (From admission, onward)     Start     Ordered   09/01/22 0000  Diet - low sodium heart healthy        09/01/22 1038           Carb modified diet DISCHARGE MEDICATION: Allergies as of 09/01/2022       Reactions   Avocado Itching   Banana Itching   Latex Itching   Eyes watering, itching.    Lisinopril    angioedema   Penicillins    Facial swelling   Rituxan [rituximab]    Itching in her throat    Vancomycin Itching        Medication List     TAKE these medications    allopurinol 300 MG tablet Commonly known as: ZYLOPRIM Take 1 tablet (300 mg total) by mouth daily.   ALPRAZolam 1 MG tablet Commonly known as: XANAX Take 1 mg by mouth 3 (three) times daily as needed for anxiety or sleep.   CYCLOPHOSPHAMIDE IV Inject into the vein every 21 ( twenty-one) days.   DOXORUBICIN HCL IV Inject into the vein every 21 ( twenty-one) days.   Fish Oil 1000 MG Caps Take by mouth.   HUMALOG KWIKPEN [place] Inject 2-10 Units into the skin daily as needed (blood sugar). Sliding scale   ibuprofen 200 MG tablet Commonly known as: ADVIL Take 600 mg by mouth every 6 (six) hours as needed for moderate pain.   Insulin Glargine w/ Trans Port 100 UNIT/ML Sopn Inject 10 Units into the skin daily.   lactobacillus acidophilus & bulgar chewable tablet Chew 1 tablet by mouth 3 (three) times daily with meals for 7 days.   levofloxacin 750 MG tablet Commonly known as: Levaquin Take 1 tablet (750 mg total) by mouth daily for 7 days.   levothyroxine 50 MCG tablet Commonly known as: SYNTHROID Take 50 mcg by mouth daily.   Lexapro 5 MG tablet Generic drug: escitalopram Take 1 tablet by mouth daily.   lidocaine 2 % solution Commonly known as: XYLOCAINE Mix 10 ml with 10 ml of Carafate an swish and swallow 4 times daily   lidocaine-prilocaine cream Commonly known as: EMLA Apply 1 Application topically as needed (Local anesthesia for port).   metoprolol tartrate 50 MG tablet Commonly known as: LOPRESSOR Take 25 mg by mouth in the morning and at bedtime.   ondansetron 4 MG tablet Commonly known as: ZOFRAN Take 1 tablet (4 mg total) by mouth every 8 (eight) hours as needed for nausea or vomiting.   pantoprazole 40 MG tablet Commonly known as: Protonix Take 1 tablet (40 mg  total) by mouth daily.   POLIVY IV Inject into the vein every 21 ( twenty-one) days.   predniSONE 20 MG tablet Commonly known as: DELTASONE Take 100 mg (5 tablets) by mouth daily with food on days 1-5 of chemotherapy   promethazine 25 MG tablet Commonly known as: PHENERGAN Take 1 tablet (25 mg total) by mouth every 6 (six) hours as needed for nausea or vomiting.   RITUXIMAB IV Inject into the vein every 21 ( twenty-one) days.   sucralfate 1 GM/10ML suspension Commonly known as: Carafate Mix 10 ml with 10 ml of lidocaine and swish and swallow 4 times daily   VITAMIN E PO Take by mouth.        Discharge Exam: Filed Weights   08/30/22 1833  Weight: 76.1 kg      Physical Exam:   General:  AAO x 3,  cooperative, no distress;   HEENT:  Normocephalic, PERRL, otherwise with in Normal limits   Neuro:  CNII-XII intact. , normal motor and sensation, reflexes intact   Lungs:   Clear to auscultation BL, Respirations unlabored,  No wheezes / crackles  Cardio:    S1/S2, RRR, No murmure, No Rubs or Gallops   Abdomen:  Soft, non-tender, bowel sounds active all four quadrants, no guarding or peritoneal signs.  Muscular  skeletal:  Limited exam -global generalized weaknesses - in bed, able to move all 4 extremities,   2+ pulses,  symmetric, No pitting edema  Skin:  Dry, warm to touch, negative for any Rashes,  Wounds: Please see nursing documentation          Condition at discharge: good  The results of significant diagnostics from this hospitalization (including imaging, microbiology, ancillary and laboratory) are listed below for reference.   Imaging Studies: DG Chest Portable 1 View  Result Date: 08/30/2022 CLINICAL DATA:  Fever, body aches, ongoing chemotherapy EXAM: PORTABLE CHEST 1 VIEW COMPARISON:  07/29/2022 FINDINGS: Mild cardiomegaly. Right chest port catheter. Both lungs are clear. The visualized skeletal structures are unremarkable. IMPRESSION: No acute  abnormality of the lungs in AP portable projection. Electronically Signed   By: Delanna Ahmadi M.D.   On: 08/30/2022 11:22    Microbiology: Results for orders placed or performed during the hospital encounter of 08/30/22  Resp Panel by RT-PCR (Flu A&B, Covid) Anterior Nasal Swab     Status: None   Collection Time: 08/30/22 11:18 AM   Specimen: Anterior Nasal Swab  Result Value Ref Range Status   SARS Coronavirus 2 by RT PCR NEGATIVE NEGATIVE Final    Comment: (NOTE) SARS-CoV-2 target nucleic acids are NOT DETECTED.  The SARS-CoV-2 RNA is generally detectable in upper respiratory specimens during the acute phase of infection. The lowest concentration of SARS-CoV-2 viral copies this assay can detect is 138 copies/mL. A negative result does not preclude SARS-Cov-2 infection and should not be used as the sole basis for treatment or other patient management decisions. A negative result may occur with  improper specimen collection/handling, submission of specimen other than nasopharyngeal swab, presence of viral mutation(s) within the areas targeted by this assay, and inadequate number of viral copies(<138 copies/mL). A negative result must be combined with clinical observations, patient history, and epidemiological information. The expected result is Negative.  Fact Sheet for Patients:  EntrepreneurPulse.com.au  Fact Sheet for Healthcare Providers:  IncredibleEmployment.be  This test is no t yet approved or cleared by the [place] FDA and  has been authorized for detection and/or diagnosis of SARS-CoV-2 by FDA under an Emergency Use Authorization (EUA). This EUA will remain  in effect (meaning this test can be used) for the duration of the COVID-19 declaration under Section 564(b)(1) of the Act, 21 U.S.C.section 360bbb-3(b)(1), unless the authorization is terminated  or revoked sooner.       Influenza A by PCR NEGATIVE NEGATIVE Final    Influenza B by PCR NEGATIVE NEGATIVE Final    Comment: (NOTE) The Xpert Xpress SARS-CoV-2/FLU/RSV plus assay is intended as an aid in the diagnosis of influenza from Nasopharyngeal swab specimens and should not be used as a sole basis for treatment. Nasal washings and aspirates are unacceptable for Xpert Xpress SARS-CoV-2/FLU/RSV testing.  Fact Sheet for Patients: EntrepreneurPulse.com.au  Fact Sheet for Healthcare Providers: IncredibleEmployment.be  This test is not yet approved or cleared by the [place] FDA and has been authorized for detection and/or diagnosis of  SARS-CoV-2 by FDA under an Emergency Use Authorization (EUA). This EUA will remain in effect (meaning this test can be used) for the duration of the COVID-19 declaration under Section 564(b)(1) of the Act, 21 U.S.C. section 360bbb-3(b)(1), unless the authorization is terminated or revoked.  Performed at [facility], [address], [place], [place] [address]   Culture, blood (routine x 2)     Status: None (Preliminary result)   Collection Time: 08/30/22 11:25 AM   Specimen: BLOOD  Result Value Ref Range Status   Specimen Description BLOOD RIGHT ANTECUBITAL  Final   Special Requests   Final    BOTTLES DRAWN AEROBIC AND ANAEROBIC Blood Culture adequate volume   Culture   Final    NO GROWTH 2 DAYS Performed at [facility], [address], [place], [place] [address]    Report Status PENDING  Incomplete  Culture, blood (routine x 2)     Status: None (Preliminary result)   Collection Time: 08/30/22 12:13 PM   Specimen: BLOOD  Result Value Ref Range Status   Specimen Description BLOOD LEFT ANTECUBITAL  Final   Special Requests   Final    BOTTLES DRAWN AEROBIC AND ANAEROBIC Blood Culture results may not be optimal due to an excessive volume of blood received in culture bottles   Culture   Final    NO GROWTH 2 DAYS Performed at [facility], [address], [place], [place]  [address]    Report Status PENDING  Incomplete    Labs: CBC: Recent Labs  Lab 08/30/22 1125 08/31/22 0459 09/01/22 0537  WBC 0.3* 0.6* 3.2*  NEUTROABS 0.0*  --   --   HGB 11.7* 10.3* 10.4*  HCT 35.3* 31.6* 31.9*  MCV 86.5 87.5 88.4  PLT 126* 91* 69*   Basic Metabolic Panel: Recent Labs  Lab 08/30/22 1125 08/31/22 0459 09/01/22 0537  NA 137 137 141  K 3.9 4.1 3.9  CL 103 104 108  CO2 _0 GLUCOSE 128* 110* 104*  BUN _1 CREATININE 0.74 0.81 0.83  CALCIUM 8.9 8.4* 8.5*   Liver Function Tests: Recent Labs  Lab 08/30/22 1125 08/31/22 0459 09/01/22 0537  AST 51* 42* 46*  ALT 73* 68* 72*  ALKPHOS 71 56 55  BILITOT 0.8 0.6 0.3  PROT 6.7 6.0* 6.0*  ALBUMIN 4.0 3.5 3.4*   CBG: Recent Labs  Lab 08/31/22 1614 08/31/22 2211 09/01/22 0032 09/01/22 0343 09/01/22 0737  GLUCAP 125* 144* 126* 128* 111*    Discharge time spent: greater than 30 minutes.  Signed: Deatra James, MD [facility] 09/01/2022

## 2022-09-01 NOTE — Progress Notes (Signed)
Patient discharged home today, transported self home in personal vehicle. Discharge summary went over with patient, patient verbalized understanding. Belongings sent home with patient.

## 2022-09-04 LAB — CULTURE, BLOOD (ROUTINE X 2)
Culture: NO GROWTH
Culture: NO GROWTH
Special Requests: ADEQUATE

## 2022-09-07 ENCOUNTER — Telehealth: Payer: Self-pay | Admitting: *Deleted

## 2022-09-07 NOTE — Telephone Encounter (Signed)
Patient called to make [place] aware that both of her daughters have tested positive for the flu and are on tamiflu. She denies any symptoms at this point, however will present to PCP to get tested and report findings and or new changes in symptoms.

## 2022-09-09 ENCOUNTER — Encounter: Payer: Self-pay | Admitting: *Deleted

## 2022-09-09 NOTE — Progress Notes (Signed)
Patient called today reporting return of temperature 101.5, non-productive cough and congestion. She has been tested for Covid and Flu and both are negative.  After talking with Dr. Delton Coombes, patient's WBC was returning to normal at discharge a few days ago, this could be viral so he does not want to restart antibiotics at this time.  He wants patient to treat symptoms at home and if she doesn't get better within a few days for her to go to PCP or urgent care. There is no need at this time to go to ER.  Patient is aware and verbalizes understanding.  Education done about receiving chemotherapy and the need to avoid anyone with illnesses due to her immunocompromised state.  She is to wear a mask and have others wear a mask if they are sick.  Frequent handwashing.  She verbalizes understanding of this as well.  She will call us next week if symptoms don't get better.

## 2022-09-12 ENCOUNTER — Inpatient Hospital Stay: Payer: Managed Care, Other (non HMO)

## 2022-09-12 ENCOUNTER — Inpatient Hospital Stay: Payer: Managed Care, Other (non HMO) | Attending: Hematology

## 2022-09-12 ENCOUNTER — Inpatient Hospital Stay: Payer: Managed Care, Other (non HMO) | Admitting: Hematology

## 2022-09-12 ENCOUNTER — Encounter: Payer: Self-pay | Admitting: Hematology

## 2022-09-12 VITALS — BP 127/73 | HR 66 | Temp 97.1°F | Resp 18

## 2022-09-12 DIAGNOSIS — C8330 Diffuse large B-cell lymphoma, unspecified site: Secondary | ICD-10-CM | POA: Insufficient documentation

## 2022-09-12 DIAGNOSIS — C8338 Diffuse large B-cell lymphoma, lymph nodes of multiple sites: Secondary | ICD-10-CM

## 2022-09-12 DIAGNOSIS — Z5111 Encounter for antineoplastic chemotherapy: Secondary | ICD-10-CM | POA: Diagnosis present

## 2022-09-12 DIAGNOSIS — Z800 Family history of malignant neoplasm of digestive organs: Secondary | ICD-10-CM | POA: Insufficient documentation

## 2022-09-12 DIAGNOSIS — Z803 Family history of malignant neoplasm of breast: Secondary | ICD-10-CM | POA: Diagnosis not present

## 2022-09-12 DIAGNOSIS — R110 Nausea: Secondary | ICD-10-CM | POA: Diagnosis not present

## 2022-09-12 DIAGNOSIS — Z95828 Presence of other vascular implants and grafts: Secondary | ICD-10-CM

## 2022-09-12 DIAGNOSIS — K219 Gastro-esophageal reflux disease without esophagitis: Secondary | ICD-10-CM | POA: Diagnosis not present

## 2022-09-12 DIAGNOSIS — Z79899 Other long term (current) drug therapy: Secondary | ICD-10-CM | POA: Diagnosis not present

## 2022-09-12 DIAGNOSIS — T451X5A Adverse effect of antineoplastic and immunosuppressive drugs, initial encounter: Secondary | ICD-10-CM | POA: Diagnosis not present

## 2022-09-12 DIAGNOSIS — Z7952 Long term (current) use of systemic steroids: Secondary | ICD-10-CM | POA: Insufficient documentation

## 2022-09-12 DIAGNOSIS — Z5112 Encounter for antineoplastic immunotherapy: Secondary | ICD-10-CM | POA: Insufficient documentation

## 2022-09-12 DIAGNOSIS — K1231 Oral mucositis (ulcerative) due to antineoplastic therapy: Secondary | ICD-10-CM | POA: Insufficient documentation

## 2022-09-12 DIAGNOSIS — G479 Sleep disorder, unspecified: Secondary | ICD-10-CM | POA: Diagnosis not present

## 2022-09-12 DIAGNOSIS — Z5189 Encounter for other specified aftercare: Secondary | ICD-10-CM | POA: Insufficient documentation

## 2022-09-12 DIAGNOSIS — C8590 Non-Hodgkin lymphoma, unspecified, unspecified site: Secondary | ICD-10-CM

## 2022-09-12 LAB — CBC WITH DIFFERENTIAL/PLATELET
Abs Immature Granulocytes: 0.02 10*3/uL (ref 0.00–0.07)
Basophils Absolute: 0 10*3/uL (ref 0.0–0.1)
Basophils Relative: 1 %
Eosinophils Absolute: 0 10*3/uL (ref 0.0–0.5)
Eosinophils Relative: 1 %
HCT: 33.5 % — ABNORMAL LOW (ref 36.0–46.0)
Hemoglobin: 11.1 g/dL — ABNORMAL LOW (ref 12.0–15.0)
Immature Granulocytes: 1 %
Lymphocytes Relative: 15 %
Lymphs Abs: 0.5 10*3/uL — ABNORMAL LOW (ref 0.7–4.0)
MCH: 28.6 pg (ref 26.0–34.0)
MCHC: 33.1 g/dL (ref 30.0–36.0)
MCV: 86.3 fL (ref 80.0–100.0)
Monocytes Absolute: 0.4 10*3/uL (ref 0.1–1.0)
Monocytes Relative: 13 %
Neutro Abs: 2.2 10*3/uL (ref 1.7–7.7)
Neutrophils Relative %: 69 %
Platelets: 251 10*3/uL (ref 150–400)
RBC: 3.88 MIL/uL (ref 3.87–5.11)
RDW: 14.2 % (ref 11.5–15.5)
WBC: 3.1 10*3/uL — ABNORMAL LOW (ref 4.0–10.5)
nRBC: 0 % (ref 0.0–0.2)

## 2022-09-12 LAB — COMPREHENSIVE METABOLIC PANEL
ALT: 66 U/L — ABNORMAL HIGH (ref 0–44)
AST: 74 U/L — ABNORMAL HIGH (ref 15–41)
Albumin: 3.7 g/dL (ref 3.5–5.0)
Alkaline Phosphatase: 65 U/L (ref 38–126)
Anion gap: 9 (ref 5–15)
BUN: 14 mg/dL (ref 8–23)
CO2: 23 mmol/L (ref 22–32)
Calcium: 8.8 mg/dL — ABNORMAL LOW (ref 8.9–10.3)
Chloride: 109 mmol/L (ref 98–111)
Creatinine, Ser: 0.69 mg/dL (ref 0.44–1.00)
GFR, Estimated: >60 mL/min (ref >60)
Glucose, Bld: 156 mg/dL — ABNORMAL HIGH (ref 70–99)
Potassium: 4.4 mmol/L (ref 3.5–5.1)
Sodium: 141 mmol/L (ref 135–145)
Total Bilirubin: 0.4 mg/dL (ref 0.3–1.2)
Total Protein: 6.4 g/dL — ABNORMAL LOW (ref 6.5–8.1)

## 2022-09-12 LAB — MAGNESIUM: Magnesium: 1.9 mg/dL (ref 1.7–2.4)

## 2022-09-12 MED ORDER — SODIUM CHLORIDE 0.9 % IV SOLN: Freq: Once | INTRAVENOUS | Status: AC

## 2022-09-12 MED ORDER — DIPHENHYDRAMINE HCL 50 MG/ML IJ SOLN
25.0000 mg | Freq: Once | INTRAMUSCULAR | Status: AC
  Administered 2022-09-12: 25 mg via INTRAVENOUS
  Filled 2022-09-12: qty 1

## 2022-09-12 MED ORDER — SODIUM CHLORIDE 0.9 % IV SOLN
10.0000 mg | Freq: Once | INTRAVENOUS | Status: AC
  Administered 2022-09-12: 10 mg via INTRAVENOUS
  Filled 2022-09-12: qty 10

## 2022-09-12 MED ORDER — PEGFILGRASTIM 6 MG/0.6ML ~~LOC~~ PSKT
6.0000 mg | PREFILLED_SYRINGE | Freq: Once | SUBCUTANEOUS | Status: AC
  Administered 2022-09-12: 6 mg via SUBCUTANEOUS
  Filled 2022-09-12: qty 0.6

## 2022-09-12 MED ORDER — PREDNISONE 50 MG PO TABS
100.0000 mg | ORAL_TABLET | Freq: Once | ORAL | Status: AC
  Administered 2022-09-12: 100 mg via ORAL
  Filled 2022-09-12: qty 2

## 2022-09-12 MED ORDER — SODIUM CHLORIDE 0.9 % IV SOLN
375.0000 mg/m2 | Freq: Once | INTRAVENOUS | Status: DC
  Filled 2022-09-12: qty 70

## 2022-09-12 MED ORDER — ACETAMINOPHEN 325 MG PO TABS
650.0000 mg | ORAL_TABLET | Freq: Once | ORAL | Status: AC
  Administered 2022-09-12: 650 mg via ORAL
  Filled 2022-09-12: qty 2

## 2022-09-12 MED ORDER — LORAZEPAM 2 MG/ML IJ SOLN
0.5000 mg | Freq: Once | INTRAMUSCULAR | Status: AC
  Administered 2022-09-12: 0.5 mg via INTRAVENOUS
  Filled 2022-09-12: qty 1

## 2022-09-12 MED ORDER — SODIUM CHLORIDE 0.9 % IV SOLN
375.0000 mg/m2 | Freq: Once | INTRAVENOUS | Status: AC
  Administered 2022-09-12: 700 mg via INTRAVENOUS
  Filled 2022-09-12: qty 50

## 2022-09-12 MED ORDER — HEPARIN SOD (PORK) LOCK FLUSH 100 UNIT/ML IV SOLN
500.0000 [IU] | Freq: Once | INTRAVENOUS | Status: AC | PRN
  Administered 2022-09-12: 500 [IU]

## 2022-09-12 MED ORDER — SODIUM CHLORIDE 0.9 % IV SOLN
750.0000 mg/m2 | Freq: Once | INTRAVENOUS | Status: AC
  Administered 2022-09-12: 1420 mg via INTRAVENOUS
  Filled 2022-09-12: qty 71

## 2022-09-12 MED ORDER — PALONOSETRON HCL INJECTION 0.25 MG/5ML
0.2500 mg | Freq: Once | INTRAVENOUS | Status: AC
  Administered 2022-09-12: 0.25 mg via INTRAVENOUS
  Filled 2022-09-12: qty 5

## 2022-09-12 MED ORDER — SODIUM CHLORIDE 0.9% FLUSH
10.0000 mL | Freq: Once | INTRAVENOUS | Status: AC
  Administered 2022-09-12: 10 mL via INTRAVENOUS

## 2022-09-12 MED ORDER — SODIUM CHLORIDE 0.9 % IV SOLN
150.0000 mg | Freq: Once | INTRAVENOUS | Status: AC
  Administered 2022-09-12: 150 mg via INTRAVENOUS
  Filled 2022-09-12: qty 150

## 2022-09-12 MED ORDER — METHYLPREDNISOLONE SODIUM SUCC 125 MG IJ SOLR
125.0000 mg | Freq: Once | INTRAMUSCULAR | Status: AC
  Administered 2022-09-12: 125 mg via INTRAVENOUS

## 2022-09-12 MED ORDER — SODIUM CHLORIDE 0.9 % IV SOLN
1.8000 mg/kg | Freq: Once | INTRAVENOUS | Status: AC
  Administered 2022-09-12: 140 mg via INTRAVENOUS
  Filled 2022-09-12: qty 7

## 2022-09-12 MED ORDER — SODIUM CHLORIDE 0.9% FLUSH
10.0000 mL | INTRAVENOUS | Status: DC | PRN
  Administered 2022-09-12: 10 mL

## 2022-09-12 MED ORDER — DOXORUBICIN HCL CHEMO IV INJECTION 2 MG/ML
50.0000 mg/m2 | Freq: Once | INTRAVENOUS | Status: AC
  Administered 2022-09-12: 94 mg via INTRAVENOUS
  Filled 2022-09-12: qty 47

## 2022-09-12 MED ORDER — FAMOTIDINE IN NACL 20-0.9 MG/50ML-% IV SOLN
20.0000 mg | Freq: Once | INTRAVENOUS | Status: AC
  Administered 2022-09-12: 20 mg via INTRAVENOUS

## 2022-09-12 NOTE — Patient Instructions (Signed)
Debbie Hall  Discharge Instructions: Thank you for choosing [facility] to provide your oncology and hematology care.  If you have a lab appointment with the [facility], please come in thru the Main Entrance and check in at the main information desk.  Wear comfortable clothing and clothing appropriate for easy access to any Portacath or PICC line.   We strive to give you quality time with your provider. You may need to reschedule your appointment if you arrive late (15 or more minutes).  Arriving late affects you and other patients whose appointments are after yours.  Also, if you miss three or more appointments without notifying the office, you may be dismissed from the clinic at the provider's discretion.      For prescription refill requests, have your pharmacy contact our office and allow 72 hours for refills to be completed.    Today you received the following chemotherapy and/or immunotherapy agents Pola R CHP      To help prevent nausea and vomiting after your treatment, we encourage you to take your nausea medication as directed.  BELOW ARE SYMPTOMS THAT SHOULD BE REPORTED IMMEDIATELY: *FEVER GREATER THAN 100.4 F (38 C) OR HIGHER *CHILLS OR SWEATING *NAUSEA AND VOMITING THAT IS NOT CONTROLLED WITH YOUR NAUSEA MEDICATION *UNUSUAL SHORTNESS OF BREATH *UNUSUAL BRUISING OR BLEEDING *URINARY PROBLEMS (pain or burning when urinating, or frequent urination) *BOWEL PROBLEMS (unusual diarrhea, constipation, pain near the anus) TENDERNESS IN MOUTH AND THROAT WITH OR WITHOUT PRESENCE OF ULCERS (sore throat, sores in mouth, or a toothache) UNUSUAL RASH, SWELLING OR PAIN  UNUSUAL VAGINAL DISCHARGE OR ITCHING   Items with * indicate a potential emergency and should be followed up as soon as possible or go to the Emergency Department if any problems should occur.  Please show the CHEMOTHERAPY ALERT CARD or IMMUNOTHERAPY ALERT CARD at check-in to the  Emergency Department and triage nurse.  Should you have questions after your visit or need to cancel or reschedule your appointment, please contact [facility] [phone]  and follow the prompts.  Office hours are 8:00 a.m. to 4:30 p.m. Monday - Friday. Please note that voicemails left after 4:00 p.m. may not be returned until the following business day.  We are closed weekends and major holidays. You have access to a nurse at all times for urgent questions. Please call the main number to the clinic [phone] and follow the prompts.  For any non-urgent questions, you may also contact your provider using MyChart. We now offer e-Visits for anyone 42 and older to request care online for non-urgent symptoms. For details visit [email or web address].   Also download the MyChart app! Go to the app store, search "MyChart", open the app, select [facility], and log in with your MyChart username and password.  Masks are optional in the cancer centers. If you would like for your care team to wear a mask while they are taking care of you, please let them know. You may have one support person who is at least 63 years old accompany you for your appointments.

## 2022-09-12 NOTE — Progress Notes (Signed)
Patient has been examined by Dr. Katragadda, and vital signs and labs have been reviewed. ANC, Creatinine, LFTs, hemoglobin, and platelets are within treatment parameters per M.D. - pt may proceed with treatment.  Primary RN and pharmacy notified.  

## 2022-09-12 NOTE — Progress Notes (Signed)
Patients port flushed without difficulty.  Good blood return noted with no bruising or swelling noted at site. Stable during access and blood draw.  Patient to remain accessed for possible treatment.  

## 2022-09-12 NOTE — Progress Notes (Signed)
Patient presents today for Pola-R-CHP per providers order.  Vital signs within parameters for treatment.  Message received from Anastasio Champion RN/Dr. Delton Coombes patient okay for treatment.  Goldonna hanging.  Patient called out and stated that her throat was dry and she was having a hard time catching her breath.  Polivy stopped, patient did not appear in distress, Vital signs 118/83, HR 72, RR18, O2 98 to 100.  MD notified.  Orders received for solumedrol and to restart after 15 minutes.  Patient states that she is feeling better that she thinks that it may have come from where she was sleeping.  Solumedrol given.  Polivy restarted.  Finished medication without incident.  Treatment given today per MD orders.  Stable during the rest of  infusion without adverse affects.  Vital signs stable.  No complaints at this time.  Discharge from clinic ambulatory in stable condition.  Alert and oriented X 3.  Follow up with [facility] as scheduled.

## 2022-09-12 NOTE — Patient Instructions (Addendum)
[place] at [facility] Discharge Instructions   You were seen and examined today by Dr. Delton Coombes.  He reviewed the results of your lab work which are normal/stable.   We will proceed with your treatment today.   We will repeat a PET scan prior to your next treatment.   Return as scheduled.      Thank you for choosing [place] at [facility] to provide your oncology and hematology care.  To afford each patient quality time with our provider, please arrive at least 15 minutes before your scheduled appointment time.   If you have a lab appointment with the [facility] please come in thru the Main Entrance and check in at the main information desk.  You need to re-schedule your appointment should you arrive 10 or more minutes late.  We strive to give you quality time with our providers, and arriving late affects you and other patients whose appointments are after yours.  Also, if you no show three or more times for appointments you may be dismissed from the clinic at the providers discretion.     Again, thank you for choosing [facility].  Our hope is that these requests will decrease the amount of time that you wait before being seen by our physicians.       _____________________________________________________________  Should you have questions after your visit to [facility], please contact our office at [phone] and follow the prompts.  Our office hours are 8:00 a.m. and 4:30 p.m. Monday - Friday.  Please note that voicemails left after 4:00 p.m. may not be returned until the following business day.  We are closed weekends and major holidays.  You do have access to a nurse 24-7, just call the main number to the clinic [phone] and do not press any options, hold on the line and a nurse will answer the phone.    For prescription refill requests, have your pharmacy contact our office and allow 72 hours.     Due to Covid, you will need to wear a mask upon entering the hospital. If you do not have a mask, a mask will be given to you at the Main Entrance upon arrival. For doctor visits, patients may have 1 support person age 2 or older with them. For treatment visits, patients can not have anyone with them due to social distancing guidelines and our immunocompromised population.

## 2022-09-12 NOTE — Progress Notes (Signed)
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  PRIOR THERAPY: None  NGS Results: FISH results pending  CURRENT THERAPY: Pola-R-CHP  BRIEF ONCOLOGIC HISTORY:  Oncology History  Diffuse large B cell lymphoma ([place])  07/27/2022 Initial Diagnosis   Diffuse large B cell lymphoma ([place])   07/27/2022 Cancer Staging   Staging form: Hodgkin and Non-Hodgkin Lymphoma, AJCC 8th Edition - Clinical stage from 07/27/2022: Stage III (Diffuse large B-cell lymphoma) - Signed by Derek Jack, MD on 07/27/2022 Histopathologic type: Malignant lymphoma, large B-cell, diffuse, NOS Stage prefix: Initial diagnosis   08/01/2022 -  Chemotherapy   Patient is on Treatment Plan : NON-HODGKINS LYMPHOMA Pola-R-CHP (Polatuzumab + Rituximab -CHP) q21d       CANCER STAGING:  Cancer Staging  Diffuse large B cell lymphoma ([place]) Staging form: Hodgkin and Non-Hodgkin Lymphoma, AJCC 8th Edition - Clinical stage from 07/27/2022: Stage III (Diffuse large B-cell lymphoma) - Signed by Derek Jack, MD on 07/27/2022   INTERVAL HISTORY:  Debbie Hall 61 y.o. female seen for follow-up and toxicity assessment prior to cycle 3 of chemotherapy.  Reports energy levels of 75%.  Reports ulcers underneath the upper lip which come and go.  She uses lidocaine and Carafate which is the most helpful than Orajel.  She has some nausea but denied any vomiting.  She was admitted to the hospital with neutropenic fever from 08/30/2022 through 09/01/2022.  REVIEW OF SYSTEMS:  Review of Systems  HENT:   Positive for mouth sores.   Gastrointestinal:  Positive for nausea.  Neurological:  Positive for numbness (In the hands when driving).  Psychiatric/Behavioral:  Positive for sleep disturbance.   All other  systems reviewed and are negative.    PAST MEDICAL/SURGICAL HISTORY:  Past Medical History:  Diagnosis Date   Anemia    Anxiety    Diabetes mellitus ([place])    Fatty liver    GERD (gastroesophageal reflux disease)    Gout    Hyperlipidemia    Hypertension    Migraines    Pneumonia    as a child   Port-A-Cath in place 07/28/2022   Past Surgical History:  Procedure Laterality Date   ABDOMINAL HYSTERECTOMY     partial   CESAREAN SECTION     x 2   TONSILLECTOMY       SOCIAL HISTORY:  Social History   Socioeconomic History   Marital status: Married    Spouse name: Not on file   Number of children: 4   Years of education: Not on file   Highest education level: Not on file  Occupational History   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    Comment: [employer]  Tobacco Use   Smoking status: Never   Smokeless tobacco: Never  Vaping Use   Vaping Use: Never used  Substance and Sexual Activity   Alcohol use: Yes    Comment: occasional   Drug use: Never   Sexual activity: Not on file  Other Topics Concern   Not on file  Social History Narrative   Not on file   Social Determinants of Health   Financial Resource Strain: Not on file  Food Insecurity: Not on file  Transportation Needs: Not on file  Physical Activity: Not on file  Stress: Not on file  Social Connections: Not on file  Intimate Partner Violence: Not on file    FAMILY HISTORY:  Family History  Problem Relation Age of Onset   Hypertension Mother    Heart disease Mother    Heart attack Mother    Pancreatic cancer Mother    Prostate cancer Father    Colon cancer Neg Hx    Esophageal cancer Neg Hx    Rectal cancer Neg Hx     CURRENT MEDICATIONS:  Outpatient Encounter Medications as of 09/12/2022  Medication Sig Note   SEMGLEE, YFGN, 100 UNIT/ML Pen Inject into the skin.    allopurinol (ZYLOPRIM) 300 MG tablet Take 1 tablet (300 mg total) by mouth daily.    ALPRAZolam (XANAX) 1 MG  tablet Take 1 mg by mouth 3 (three) times daily as needed for anxiety or sleep.    CYCLOPHOSPHAMIDE IV Inject into the vein every 21 ( twenty-one) days.    DOXORUBICIN HCL IV Inject into the vein every 21 ( twenty-one) days.    escitalopram (LEXAPRO) 5 MG tablet Take 1 tablet by mouth daily.    ibuprofen (ADVIL) 200 MG tablet Take 600 mg by mouth every 6 (six) hours as needed for moderate pain.    Insulin Glargine w/ Trans Port 100 UNIT/ML SOPN Inject 10 Units into the skin daily.    Insulin Lispro (HUMALOG KWIKPEN [place]) Inject 2-10 Units into the skin daily as needed (blood sugar). Sliding scale 08/30/2022: Only PRN had to use because pt was on Prednisone.   levothyroxine (SYNTHROID) 50 MCG tablet Take 50 mcg by mouth daily.    lidocaine (XYLOCAINE) 2 % solution Mix 10 ml with 10 ml of Carafate an swish and swallow 4 times daily    lidocaine-prilocaine (EMLA) cream Apply 1 Application topically as needed (Local anesthesia for port).    Omega-3 Fatty Acids (FISH OIL) 1000 MG CAPS Take by mouth.    ondansetron (ZOFRAN) 4 MG tablet Take 1 tablet (4 mg total) by mouth every 8 (eight) hours as needed for nausea or vomiting.    pantoprazole (PROTONIX) 40 MG tablet Take 1 tablet (40 mg total) by mouth daily.    Polatuzumab Vedotin-piiq (POLIVY IV) Inject into the vein every 21 ( twenty-one) days.    predniSONE (DELTASONE) 20 MG tablet Take 100 mg (5 tablets) by mouth daily with food on days 1-5 of chemotherapy    promethazine (PHENERGAN) 25 MG tablet Take 1 tablet (25 mg total) by mouth every 6 (six) hours as needed for nausea or vomiting.    RITUXIMAB IV Inject into the vein every 21 ( twenty-one) days.    sucralfate (CARAFATE) 1 GM/10ML suspension Mix 10 ml with 10 ml of lidocaine and swish and swallow 4 times daily    VITAMIN E PO Take by mouth.    [DISCONTINUED] metoprolol tartrate (LOPRESSOR) 50 MG tablet Take 25 mg by mouth in the morning and at bedtime.    Facility-Administered Encounter  Medications as of 09/12/2022  Medication   [COMPLETED] sodium chloride flush (NS) 0.9 % injection 10 mL    ALLERGIES:  Allergies  Allergen Reactions   Avocado Itching   Banana Itching   Latex Itching    Eyes watering, itching.   Lisinopril     angioedema   Penicillins     Facial swelling   Polivy [Polatuzumab Vedotin] Other (See Comments)    Dry throat and trouble catching her breath.   Rituxan [Rituximab]     Itching in her throat  Vancomycin Itching     PHYSICAL EXAM:  ECOG Performance status: 0  There were no vitals filed for this visit.  Filed Weights   09/12/22 0924  Weight: 172 lb 6.4 oz (78.2 kg)    Physical Exam Vitals reviewed.  Constitutional:      Appearance: Normal appearance.  Cardiovascular:     Rate and Rhythm: Normal rate and regular rhythm.  Pulmonary:     Breath sounds: Normal breath sounds.  Abdominal:     Palpations: Abdomen is soft. There is no mass.  Neurological:     Mental Status: She is alert.  Psychiatric:        Mood and Affect: Mood normal.        Behavior: Behavior normal.    Right groin lymphadenopathy palpable.  LABORATORY DATA:  I have reviewed the labs as listed.  CBC    Component Value Date/Time   WBC 3.1 (L) 09/12/2022 0835   RBC 3.88 09/12/2022 0835   HGB 11.1 (L) 09/12/2022 0835   HCT 33.5 (L) 09/12/2022 0835   PLT 251 09/12/2022 0835   MCV 86.3 09/12/2022 0835   MCH 28.6 09/12/2022 0835   MCHC 33.1 09/12/2022 0835   RDW 14.2 09/12/2022 0835   LYMPHSABS 0.5 (L) 09/12/2022 0835   MONOABS 0.4 09/12/2022 0835   EOSABS 0.0 09/12/2022 0835   BASOSABS 0.0 09/12/2022 0835      Latest Ref Rng & Units 09/12/2022    8:35 AM 09/01/2022    5:37 AM 08/31/2022    4:59 AM  CMP  Glucose 70 - 99 mg/dL 156  104  110   BUN 8 - 23 mg/dL _0 Creatinine 0.44 - 1.00 mg/dL 0.69  0.83  0.81   Sodium 135 - 145 mmol/L 141  141  137   Potassium 3.5 - 5.1 mmol/L 4.4  3.9  4.1   Chloride 98 - 111 mmol/L 109  108  104    CO2 22 - 32 mmol/L _1 Calcium 8.9 - 10.3 mg/dL 8.8  8.5  8.4   Total Protein 6.5 - 8.1 g/dL 6.4  6.0  6.0   Total Bilirubin 0.3 - 1.2 mg/dL 0.4  0.3  0.6   Alkaline Phos 38 - 126 U/L 65  55  56   AST 15 - 41 U/L 74  46  42   ALT 0 - 44 U/L 66  72  68     DIAGNOSTIC IMAGING:  I have independently reviewed the scans and discussed with the patient.  ASSESSMENT:  1.  Stage III DLBCL: - CT soft tissue neck (06/06/2022): Left supraclavicular mass 1.9 x 1.6 cm central hypodensity.  Smaller adjacent nodule present.  Oval-shaped mass anterior to the left IJ vein 2.9 x 1.8 cm.  No definite 2 exophytic oropharyngeal or hypopharyngeal mass noted.  Thyroid intact.  Lung bases clear. - Left supraclavicular lymph node biopsy by Dr. Ladona Horns on 06/10/2022 - Pathology: Atypical B-cell lymphoid infiltrate highly concerning for a lymphoproliferative disorder, possibly high-grade in the background of extensive granulomatous type inflammation and necrosis. - 2D echo (06/28/2022): LVEF 60-65%. - PET scan (07/07/2022): Markedly hypermetabolic adenopathy in the neck, chest, pelvis and inguinal regions.  Few subcentimeter pulmonary nodules are too small for PET resolution.  Hepatic steatosis. - Right inguinal lymph node biopsy (07/20/2022): Large B-cell lymphoma with Ki-67 70%.  FISH studies for high risk lymphoma sent. - IPI score-3 (age more than 62, stage  III, elevated LDH) - 2D echo (06/28/2022): EF 60 to 65%. - Cycle 1 Pola-R-CHP on 08/01/2022   2.  Social/family history: - She lives at home with her husband.  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  Non-smoker.  No history of exposure to chemicals or pesticides. - Mother died of pancreatic cancer.  Father had prostate cancer.  Sister had breast cancer at age 78.  Brother had kidney cancer at age 63.    PLAN:  1.  Stage III DLBCL: - She received cycle 2 on 08/23/2022. - She was admitted to the hospital with neutropenic fever from 08/30/2022  through 09/01/2022.  She received G-CSF x 3 days while in the hospital. - She reports improvement in energy levels. - Reviewed labs today which showed AST 74 and ALT 66 with bilirubin normal.  Creatinine normal.  CBC shows white count 3.1 with ANC of 2.2. - Proceed with cycle 3 today.  RTC with PET scan in 3 weeks.   2.  TLS prophylaxis: - Tumor lysis labs including magnesium, potassium, creatinine are normal. - Continue allopurinol 300 mg daily.  3.  Acid reflux: - Continue Protonix 40 mg daily which is helping.  4.  Nausea: - Continue Zofran 8 mg every 8 hours as needed.  5.  Mouth ulcers: - Mostly under the upper lip.  Use lidocaine with Carafate as needed.    Orders placed this encounter:  Orders Placed This Encounter  Procedures   NM PET Image Restag (PS) Skull Base To Thigh        Derek Jack, MD [place] [phone]

## 2022-09-12 NOTE — Progress Notes (Signed)
Hypersensitivity Reaction note  Date of event: 09/12/22 Time of event: 1250 Generic name of drug involved: Denyse Dago Name of provider notified of the hypersensitivity reaction: Dr. Delton Coombes Was agent that likely caused hypersensitivity reaction added to Allergies List within EMR? yes Chain of events including reaction signs/symptoms, treatment administered, and outcome (e.g., drug resumed; drug discontinued; sent to Emergency Department; etc.) 1250 Polivy hanging. Patient called out and stated that her throat was dry and she was having a hard time catching her breath. Polivy stopped, patient did not appear in distress, Vital signs 118/83, HR 72, RR18, O2 98 to 100. MD notified. Orders received for solumedrol and to restart after 15 minutes. Patient states that she is feeling better that she thinks that it may have come from where she was sleeping. Solumedrol given. Polivy restarted. Finished medication without incident.  Marlane Hatcher, RN 09/12/2022 2:44 PM

## 2022-09-13 ENCOUNTER — Telehealth: Payer: Self-pay | Admitting: *Deleted

## 2022-09-13 NOTE — Telephone Encounter (Signed)
Pt called after 24 hour chemotherapy reaction. Pt stated she felt good but did have some nausea this morning. Pt stated she took nausea medication and nausea resolved. Pt advised to call the clinic if needed.

## 2022-09-16 ENCOUNTER — Other Ambulatory Visit: Payer: Self-pay

## 2022-09-20 ENCOUNTER — Other Ambulatory Visit: Payer: Self-pay | Admitting: *Deleted

## 2022-09-20 ENCOUNTER — Telehealth: Payer: Self-pay | Admitting: *Deleted

## 2022-09-20 ENCOUNTER — Encounter: Payer: Self-pay | Admitting: *Deleted

## 2022-09-20 MED ORDER — MORPHINE SULFATE 10 MG/5ML PO SOLN: 5.0000 mg | ORAL | 0 refills | Status: DC | PRN

## 2022-09-20 NOTE — Telephone Encounter (Signed)
Received call from patient advising that sores in mouth have gotten worse despite use of Carafate/Lidocaine mixture 4 times daily.  Consulted with Dr. Delton Coombes who recommends soaking lidocaine on a cotton ball and placing it on the areas to ensure they are numb.  In addition he will send in liquid morphine to mix 5 mg in a teaspoon of water and swish as long as she can tolerate and spit.  Patient verbalized understanding and will call back if this is not effective.

## 2022-09-21 ENCOUNTER — Other Ambulatory Visit: Payer: Self-pay

## 2022-09-21 ENCOUNTER — Emergency Department (HOSPITAL_COMMUNITY): Payer: Managed Care, Other (non HMO)

## 2022-09-21 ENCOUNTER — Encounter (HOSPITAL_COMMUNITY): Payer: Self-pay | Admitting: *Deleted

## 2022-09-21 ENCOUNTER — Inpatient Hospital Stay (HOSPITAL_COMMUNITY)
Admission: EM | Admit: 2022-09-21 | Discharge: 2022-09-23 | DRG: 871 | Disposition: A | Discharge status: Home / Self Care | Payer: Managed Care, Other (non HMO) | Attending: Internal Medicine | Admitting: Internal Medicine

## 2022-09-21 DIAGNOSIS — A4189 Other specified sepsis: Principal | ICD-10-CM | POA: Diagnosis present

## 2022-09-21 DIAGNOSIS — F419 Anxiety disorder, unspecified: Secondary | ICD-10-CM | POA: Diagnosis present

## 2022-09-21 DIAGNOSIS — Z8701 Personal history of pneumonia (recurrent): Secondary | ICD-10-CM | POA: Diagnosis not present

## 2022-09-21 DIAGNOSIS — Z79632 Long term (current) use of antitumor antibiotic: Secondary | ICD-10-CM

## 2022-09-21 DIAGNOSIS — Z79630 Long term (current) use of alkylating agent: Secondary | ICD-10-CM | POA: Diagnosis not present

## 2022-09-21 DIAGNOSIS — Z794 Long term (current) use of insulin: Secondary | ICD-10-CM

## 2022-09-21 DIAGNOSIS — I10 Essential (primary) hypertension: Secondary | ICD-10-CM | POA: Diagnosis present

## 2022-09-21 DIAGNOSIS — E039 Hypothyroidism, unspecified: Secondary | ICD-10-CM | POA: Diagnosis present

## 2022-09-21 DIAGNOSIS — R7989 Other specified abnormal findings of blood chemistry: Secondary | ICD-10-CM | POA: Diagnosis present

## 2022-09-21 DIAGNOSIS — E119 Type 2 diabetes mellitus without complications: Secondary | ICD-10-CM | POA: Diagnosis not present

## 2022-09-21 DIAGNOSIS — C8338 Diffuse large B-cell lymphoma, lymph nodes of multiple sites: Secondary | ICD-10-CM

## 2022-09-21 DIAGNOSIS — Z91018 Allergy to other foods: Secondary | ICD-10-CM

## 2022-09-21 DIAGNOSIS — Z888 Allergy status to other drugs, medicaments and biological substances status: Secondary | ICD-10-CM | POA: Diagnosis not present

## 2022-09-21 DIAGNOSIS — M109 Gout, unspecified: Secondary | ICD-10-CM | POA: Diagnosis present

## 2022-09-21 DIAGNOSIS — Z880 Allergy status to penicillin: Secondary | ICD-10-CM

## 2022-09-21 DIAGNOSIS — K219 Gastro-esophageal reflux disease without esophagitis: Secondary | ICD-10-CM | POA: Diagnosis present

## 2022-09-21 DIAGNOSIS — J069 Acute upper respiratory infection, unspecified: Principal | ICD-10-CM

## 2022-09-21 DIAGNOSIS — K760 Fatty (change of) liver, not elsewhere classified: Secondary | ICD-10-CM | POA: Diagnosis present

## 2022-09-21 DIAGNOSIS — R509 Fever, unspecified: Secondary | ICD-10-CM | POA: Diagnosis present

## 2022-09-21 DIAGNOSIS — Z95828 Presence of other vascular implants and grafts: Secondary | ICD-10-CM | POA: Diagnosis not present

## 2022-09-21 DIAGNOSIS — U071 COVID-19: Secondary | ICD-10-CM | POA: Diagnosis present

## 2022-09-21 DIAGNOSIS — A419 Sepsis, unspecified organism: Secondary | ICD-10-CM

## 2022-09-21 DIAGNOSIS — D61818 Other pancytopenia: Secondary | ICD-10-CM | POA: Diagnosis present

## 2022-09-21 DIAGNOSIS — Z7952 Long term (current) use of systemic steroids: Secondary | ICD-10-CM

## 2022-09-21 DIAGNOSIS — J208 Acute bronchitis due to other specified organisms: Secondary | ICD-10-CM | POA: Diagnosis present

## 2022-09-21 DIAGNOSIS — Z90710 Acquired absence of both cervix and uterus: Secondary | ICD-10-CM | POA: Diagnosis not present

## 2022-09-21 DIAGNOSIS — Z8249 Family history of ischemic heart disease and other diseases of the circulatory system: Secondary | ICD-10-CM

## 2022-09-21 DIAGNOSIS — Z79899 Other long term (current) drug therapy: Secondary | ICD-10-CM

## 2022-09-21 DIAGNOSIS — E785 Hyperlipidemia, unspecified: Secondary | ICD-10-CM | POA: Diagnosis present

## 2022-09-21 DIAGNOSIS — Z79890 Hormone replacement therapy (postmenopausal): Secondary | ICD-10-CM

## 2022-09-21 DIAGNOSIS — Z881 Allergy status to other antibiotic agents status: Secondary | ICD-10-CM

## 2022-09-21 DIAGNOSIS — Z79620 Long term (current) use of immunosuppressive biologic: Secondary | ICD-10-CM

## 2022-09-21 DIAGNOSIS — Z91040 Latex allergy status: Secondary | ICD-10-CM

## 2022-09-21 DIAGNOSIS — C8330 Diffuse large B-cell lymphoma, unspecified site: Secondary | ICD-10-CM | POA: Diagnosis present

## 2022-09-21 HISTORY — DX: Non-Hodgkin lymphoma, unspecified, unspecified site: C85.90

## 2022-09-21 LAB — COMPREHENSIVE METABOLIC PANEL
ALT: 63 U/L — ABNORMAL HIGH (ref 0–44)
AST: 41 U/L (ref 15–41)
Albumin: 3.8 g/dL (ref 3.5–5.0)
Alkaline Phosphatase: 57 U/L (ref 38–126)
Anion gap: 10 (ref 5–15)
BUN: 12 mg/dL (ref 8–23)
CO2: 22 mmol/L (ref 22–32)
Calcium: 9.1 mg/dL (ref 8.9–10.3)
Chloride: 105 mmol/L (ref 98–111)
Creatinine, Ser: 0.84 mg/dL (ref 0.44–1.00)
GFR, Estimated: >60 mL/min (ref >60)
Glucose, Bld: 194 mg/dL — ABNORMAL HIGH (ref 70–99)
Potassium: 4 mmol/L (ref 3.5–5.1)
Sodium: 137 mmol/L (ref 135–145)
Total Bilirubin: 0.7 mg/dL (ref 0.3–1.2)
Total Protein: 6.6 g/dL (ref 6.5–8.1)

## 2022-09-21 LAB — CBC WITH DIFFERENTIAL/PLATELET
Abs Immature Granulocytes: 0 10*3/uL (ref 0.00–0.07)
Band Neutrophils: 2 %
Basophils Absolute: 0 10*3/uL (ref 0.0–0.1)
Basophils Relative: 3 %
Eosinophils Absolute: 0.1 10*3/uL (ref 0.0–0.5)
Eosinophils Relative: 5 %
HCT: 30.5 % — ABNORMAL LOW (ref 36.0–46.0)
Hemoglobin: 10.4 g/dL — ABNORMAL LOW (ref 12.0–15.0)
Lymphocytes Relative: 15 %
Lymphs Abs: 0.2 10*3/uL — ABNORMAL LOW (ref 0.7–4.0)
MCH: 29.2 pg (ref 26.0–34.0)
MCHC: 34.1 g/dL (ref 30.0–36.0)
MCV: 85.7 fL (ref 80.0–100.0)
Monocytes Absolute: 0 10*3/uL — ABNORMAL LOW (ref 0.1–1.0)
Monocytes Relative: 3 %
Neutro Abs: 0.9 10*3/uL — ABNORMAL LOW (ref 1.7–7.7)
Neutrophils Relative %: 71 %
Platelets: 21 10*3/uL — CL (ref 150–400)
Promyelocytes Relative: 1 %
RBC: 3.56 MIL/uL — ABNORMAL LOW (ref 3.87–5.11)
RDW: 13 % (ref 11.5–15.5)
WBC: 1.2 10*3/uL — CL (ref 4.0–10.5)
nRBC: 0 % (ref 0.0–0.2)
nRBC: 1 /100 WBC — ABNORMAL HIGH

## 2022-09-21 LAB — URINALYSIS, ROUTINE W REFLEX MICROSCOPIC
Bilirubin Urine: NEGATIVE
Glucose, UA: NEGATIVE mg/dL
Hgb urine dipstick: NEGATIVE
Ketones, ur: 5 mg/dL — AB
Leukocytes,Ua: NEGATIVE
Nitrite: NEGATIVE
Protein, ur: 30 mg/dL — AB
Specific Gravity, Urine: 1.014 (ref 1.005–1.030)
pH: 7 (ref 5.0–8.0)

## 2022-09-21 LAB — RESP PANEL BY RT-PCR (RSV, FLU A&B, COVID)  RVPGX2
Influenza A by PCR: NEGATIVE
Influenza B by PCR: NEGATIVE
Resp Syncytial Virus by PCR: NEGATIVE
SARS Coronavirus 2 by RT PCR: POSITIVE — AB

## 2022-09-21 LAB — LACTIC ACID, PLASMA
Lactic Acid, Venous: 2.2 mmol/L (ref 0.5–1.9)
Lactic Acid, Venous: 1.9 mmol/L (ref 0.5–1.9)

## 2022-09-21 LAB — LIPASE, BLOOD: Lipase: 27 U/L (ref 11–51)

## 2022-09-21 MED ORDER — DOCUSATE SODIUM 100 MG PO CAPS
100.0000 mg | ORAL_CAPSULE | Freq: Two times a day (BID) | ORAL | Status: DC
  Filled 2022-09-21 (×4): qty 1

## 2022-09-21 MED ORDER — ACETAMINOPHEN 325 MG PO TABS
650.0000 mg | ORAL_TABLET | Freq: Four times a day (QID) | ORAL | Status: DC | PRN
  Administered 2022-09-21: 650 mg via ORAL
  Filled 2022-09-21: qty 2

## 2022-09-21 MED ORDER — VANCOMYCIN HCL 1750 MG/350ML IV SOLN
1750.0000 mg | Freq: Once | INTRAVENOUS | Status: DC
  Filled 2022-09-21: qty 350

## 2022-09-21 MED ORDER — LEVOTHYROXINE SODIUM 50 MCG PO TABS
50.0000 ug | ORAL_TABLET | Freq: Every day | ORAL | Status: DC
  Administered 2022-09-22 – 2022-09-23 (×2): 50 ug via ORAL
  Filled 2022-09-21 (×2): qty 1

## 2022-09-21 MED ORDER — ACETAMINOPHEN 325 MG PO TABS
650.0000 mg | ORAL_TABLET | Freq: Once | ORAL | Status: AC
  Administered 2022-09-21: 650 mg via ORAL
  Filled 2022-09-21: qty 2

## 2022-09-21 MED ORDER — ALPRAZOLAM 0.5 MG PO TABS
1.0000 mg | ORAL_TABLET | Freq: Three times a day (TID) | ORAL | Status: DC | PRN
  Administered 2022-09-21 – 2022-09-22 (×2): 1 mg via ORAL
  Filled 2022-09-21 (×2): qty 2

## 2022-09-21 MED ORDER — SUCRALFATE 1 GM/10ML PO SUSP: 1.0000 g | Freq: Four times a day (QID) | ORAL | Status: DC

## 2022-09-21 MED ORDER — SODIUM CHLORIDE 0.9 % IV SOLN
2.0000 g | Freq: Three times a day (TID) | INTRAVENOUS | Status: DC
  Administered 2022-09-21 – 2022-09-23 (×5): 2 g via INTRAVENOUS
  Filled 2022-09-21 (×6): qty 12.5

## 2022-09-21 MED ORDER — SODIUM CHLORIDE 0.9% FLUSH
3.0000 mL | Freq: Two times a day (BID) | INTRAVENOUS | Status: DC
  Administered 2022-09-21 – 2022-09-22 (×3): 3 mL via INTRAVENOUS

## 2022-09-21 MED ORDER — SODIUM CHLORIDE 0.9% FLUSH: 3.0000 mL | INTRAVENOUS | Status: DC | PRN

## 2022-09-21 MED ORDER — PANTOPRAZOLE SODIUM 40 MG PO TBEC
40.0000 mg | DELAYED_RELEASE_TABLET | Freq: Every day | ORAL | Status: DC
  Administered 2022-09-21 – 2022-09-22 (×2): 40 mg via ORAL
  Filled 2022-09-21 (×3): qty 1

## 2022-09-21 MED ORDER — PROMETHAZINE HCL 12.5 MG PO TABS: 25.0000 mg | ORAL_TABLET | Freq: Four times a day (QID) | ORAL | Status: DC | PRN

## 2022-09-21 MED ORDER — ALLOPURINOL 300 MG PO TABS
300.0000 mg | ORAL_TABLET | Freq: Every day | ORAL | Status: DC
  Administered 2022-09-21: 300 mg via ORAL
  Filled 2022-09-21 (×3): qty 1

## 2022-09-21 MED ORDER — MORPHINE SULFATE (CONCENTRATE) 10 MG/0.5ML PO SOLN: 5.0000 mg | ORAL | Status: DC | PRN

## 2022-09-21 MED ORDER — ONDANSETRON HCL 4 MG PO TABS
4.0000 mg | ORAL_TABLET | Freq: Three times a day (TID) | ORAL | Status: DC | PRN
  Administered 2022-09-22 – 2022-09-23 (×3): 4 mg via ORAL
  Filled 2022-09-21 (×3): qty 1

## 2022-09-21 MED ORDER — SUCRALFATE 1 GM/10ML PO SUSP
1.0000 g | Freq: Four times a day (QID) | ORAL | Status: DC
  Administered 2022-09-21 – 2022-09-22 (×6): 1 g via ORAL
  Filled 2022-09-21 (×7): qty 10

## 2022-09-21 MED ORDER — LIDOCAINE VISCOUS HCL 2 % MT SOLN
10.0000 mL | Freq: Four times a day (QID) | OROMUCOSAL | Status: DC
  Administered 2022-09-21 – 2022-09-22 (×6): 10 mL via OROMUCOSAL
  Filled 2022-09-21 (×7): qty 15

## 2022-09-21 MED ORDER — INSULIN GLARGINE-YFGN 100 UNIT/ML ~~LOC~~ SOLN
10.0000 [IU] | Freq: Every day | SUBCUTANEOUS | Status: DC
  Administered 2022-09-22: 10 [IU] via SUBCUTANEOUS
  Filled 2022-09-21 (×4): qty 0.1

## 2022-09-21 MED ORDER — DIPHENHYDRAMINE HCL 50 MG/ML IJ SOLN
25.0000 mg | Freq: Once | INTRAMUSCULAR | Status: AC
  Administered 2022-09-21: 25 mg via INTRAVENOUS
  Filled 2022-09-21: qty 1

## 2022-09-21 MED ORDER — LACTATED RINGERS IV BOLUS
1000.0000 mL | Freq: Once | INTRAVENOUS | Status: AC
  Administered 2022-09-21: 1000 mL via INTRAVENOUS

## 2022-09-21 MED ORDER — SODIUM CHLORIDE 0.9 % IV SOLN
8.0000 mg/kg | Freq: Every day | INTRAVENOUS | Status: DC
  Administered 2022-09-21 – 2022-09-22 (×2): 450 mg via INTRAVENOUS
  Filled 2022-09-21 (×3): qty 9

## 2022-09-21 MED ORDER — SODIUM CHLORIDE 0.9 % IV SOLN
2.0000 g | Freq: Once | INTRAVENOUS | Status: AC
  Administered 2022-09-21: 2 g via INTRAVENOUS
  Filled 2022-09-21: qty 12.5

## 2022-09-21 MED ORDER — ACETAMINOPHEN 650 MG RE SUPP: 650.0000 mg | Freq: Four times a day (QID) | RECTAL | Status: DC | PRN

## 2022-09-21 MED ORDER — SODIUM CHLORIDE 0.9 % IV SOLN
250.0000 mL | INTRAVENOUS | Status: DC | PRN
  Administered 2022-09-22: 250 mL via INTRAVENOUS

## 2022-09-21 NOTE — Progress Notes (Addendum)
Pharmacy Antibiotic Note  Debbie Hall is a 61 y.o. female admitted on 09/21/2022 with  sepsis w/ neutropenia- patient also allergic to Vancomycin .  Pharmacy has been consulted for daptomycin dosing.  Plan: Daptomycin 450 mg IV every 24 hours. Cefepime 2000 mg IV every 8 hours. Monitor labs, c/s, and patient improvement.  Height: '5\' 5"'$  (165.1 cm) Weight: 78.2 kg (172 lb 6.4 oz) IBW/kg (Calculated) : 57  Temp (24hrs), Avg:101.2 F (38.4 C), Min:99.1 F (37.3 C), Max:103.2 F (39.6 C)  Recent Labs  Lab 09/21/22 0914 09/21/22 1046  WBC 1.2*  --   CREATININE 0.84  --   LATICACIDVEN 2.2* 1.9    Estimated Creatinine Clearance: 70.9 mL/min (by C-G formula based on SCr of 0.84 mg/dL).    Allergies  Allergen Reactions   Avocado Itching   Banana Itching   Latex Itching    Eyes watering, itching.   Lisinopril     angioedema   Penicillins     Facial swelling   Polivy [Polatuzumab Vedotin] Other (See Comments)    Dry throat and trouble catching her breath.   Rituxan [Rituximab]     Itching in her throat    Vancomycin Itching    Antimicrobials this admission: Dapto 12/27 >> Cefepime 12/27 >>    Microbiology results: 12/27 BCx: pending COVID: positive   Thank you for allowing pharmacy to be a part of this patient's care.  Margot Ables, PharmD Clinical Pharmacist 09/21/2022 12:19 PM

## 2022-09-21 NOTE — ED Triage Notes (Signed)
Pt c/o fever with cough and sores to her mouth; pt had chemo on December 18th

## 2022-09-21 NOTE — H&P (Addendum)
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  Patient denies any nausea, vomiting, diarrhea.  She however complains of oral ulcerations and was supposed to be on some oral preparation as outpatient.  She denied any skin rash or recent travel.  No Port-A-Cath site tenderness or pain.  Denies dysphagia.  Denies any chest pain, palpitation, syncope.  Patient denies urinary urgency, frequency or dysuria.  Denies any changes in appetite.  Had some oral ulceration but denies any dysphagia.  In the ED, patient was febrile with a temperature of 103.2 F with mild tachycardia and tachypnea.  Initial labs showed lactate elevated at 2.2.  CBC showed mild leukopenia, a hemoglobin of 10.4 with platelet of 21 K.  Chest x-ray done in the ED was negative for acute infiltrate.  COVID-19 PCR was positive.  Patient was then considered for admission to hospital for further evaluation and treatment.   Assessment and Plan  Sepsis secondary to COVID-19 infection.   No hypoxia at this time.  Continue symptomatic care.  Will need to rule out bacterial infection.  Follow pancultures.  LFTs within normal limits.  No hypoxia or infiltrate.  Will provide symptomatic care.  Patient had fever, leukopenia tachypnea with elevated lactate with COVID-19 infection suggestive of  sepsis.  Fever with pancytopenia  Will need to rule out bacterial infection.  Blood cultures and urine cultures have been sent.  Patient is currently on broad-spectrum antibiotic with daptomycin and cefepime.  Allergic to vancomycin.  Will  discontinue/de-escalate antibiotic depending upon culture sensitivity and fever trend.  Port-A-Cath site appears healthy.  Will closely monitor for bleeding.  Patient has severe thrombocytopenia.  Diffuse B-cell lymphoma on chemotherapy.  Recent chemotherapy on the 18th.  Follows up with oncology as outpatient.    Oral ulceration.  Could be source of bacteremia and fever.  Will follow cultures.  Oral care with sucralfate and lidocaine.  Diabetes mellitus type 2.  On insulin at home with Lantus 10 units.  Was on sliding scale during prednisone usage.  Will add sliding scale insulin if needed.  Latest hemoglobin A1c 3 weeks back was 5.9.  Hypothyroidism.  Continue Synthroid.  DVT Prophylaxis: SCD for now.  Review of Systems:  All systems were reviewed and were negative unless otherwise mentioned in the HPI   Past Medical History:  Diagnosis Date   Anemia    Anxiety    Diabetes mellitus ([place])    Fatty liver    GERD (gastroesophageal reflux disease)    Gout    Hyperlipidemia    Hypertension    Lymphoma ([place])    Migraines    Pneumonia    as a child   Port-A-Cath in place 07/28/2022   Past Surgical History:  Procedure Laterality Date   ABDOMINAL HYSTERECTOMY     partial   CESAREAN SECTION     x 2   TONSILLECTOMY      Social History:  reports that she has never smoked. She has never used smokeless tobacco. She reports current alcohol use. She reports that she does not use drugs.  Allergies  Allergen Reactions   Avocado Itching  Banana Itching   Latex Itching    Eyes watering, itching.   Lisinopril     angioedema   Penicillins     Facial swelling   Polivy [Polatuzumab Vedotin] Other (See Comments)    Dry throat and trouble catching her breath.   Rituxan [Rituximab]     Itching in her throat    Vancomycin Itching    Family History  Problem Relation Age of Onset   Hypertension Mother    Heart disease Mother    Heart attack Mother    Pancreatic cancer Mother     Prostate cancer Father    Colon cancer Neg Hx    Esophageal cancer Neg Hx    Rectal cancer Neg Hx      Prior to Admission medications   Medication Sig Start Date End Date Taking? Authorizing Provider  allopurinol (ZYLOPRIM) 300 MG tablet Take 1 tablet (300 mg total) by mouth daily. 08/01/22  Yes Derek Jack, MD  ALPRAZolam Duanne Moron) 1 MG tablet Take 1 mg by mouth 3 (three) times daily as needed for anxiety or sleep. 08/03/22  Yes [provider]  CYCLOPHOSPHAMIDE IV Inject into the vein every 21 ( twenty-one) days. 08/01/22  Yes [provider]  DOXORUBICIN HCL IV Inject into the vein every 21 ( twenty-one) days.   Yes [provider]  ibuprofen (ADVIL) 200 MG tablet Take 600 mg by mouth every 6 (six) hours as needed for moderate pain.   Yes [provider]  Insulin Glargine w/ Trans Port 100 UNIT/ML SOPN Inject 10 Units into the skin daily. 03/22/21  Yes [provider]  Insulin Lispro (HUMALOG KWIKPEN [place]) Inject 2-10 Units into the skin daily as needed (blood sugar). Sliding scale   Yes [provider]  levothyroxine (SYNTHROID) 50 MCG tablet Take 50 mcg by mouth daily. 06/07/22  Yes [provider]  lidocaine (XYLOCAINE) 2 % solution Mix 10 ml with 10 ml of Carafate an swish and swallow 4 times daily 08/10/22  Yes Derek Jack, MD  Omega-3 Fatty Acids (FISH OIL) 1000 MG CAPS Take by mouth.   Yes [provider]  ondansetron (ZOFRAN) 4 MG tablet Take 1 tablet (4 mg total) by mouth every 8 (eight) hours as needed for nausea or vomiting. 08/29/22  Yes Derek Jack, MD  pantoprazole (PROTONIX) 40 MG tablet Take 1 tablet (40 mg total) by mouth daily. 08/08/22  Yes Derek Jack, MD  Polatuzumab Vedotin-piiq (POLIVY IV) Inject into the vein every 21 ( twenty-one) days. 08/01/22  Yes [provider]  predniSONE (DELTASONE) 20 MG tablet Take 100 mg (5 tablets) by mouth daily with food on days 1-5  of chemotherapy 08/01/22  Yes Derek Jack, MD  promethazine (PHENERGAN) 25 MG tablet Take 1 tablet (25 mg total) by mouth every 6 (six) hours as needed for nausea or vomiting. 08/01/22  Yes Derek Jack, MD  RITUXIMAB IV Inject into the vein every 21 ( twenty-one) days. 08/01/22  Yes [provider]  sucralfate (CARAFATE) 1 GM/10ML suspension Mix 10 ml with 10 ml of lidocaine and swish and swallow 4 times daily 08/10/22  Yes Derek Jack, MD  VITAMIN E PO Take 1 capsule by mouth daily.   Yes [provider]  escitalopram (LEXAPRO) 5 MG tablet Take 1 tablet by mouth daily. Patient not taking: Reported on 09/21/2022 10/10/19   [provider]  lidocaine-prilocaine (EMLA) cream Apply 1 Application topically as needed (Local anesthesia for port). Patient not taking: Reported on 09/21/2022  07/27/22   Derek Jack, MD  morphine 10 MG/5ML solution Take 2.5 mLs (5 mg total) by mouth every 2 (two) hours as needed for severe pain. Dilute in 1tsp of water and swish, then spit Patient not taking: Reported on 09/21/2022 09/20/22   Derek Jack, MD    Physical Exam: Vitals:   09/21/22 0841 09/21/22 0842 09/21/22 1200 09/21/22 1203  BP: (!) 155/77  (!) 95/48 (!) 102/51  Pulse: (!) 127  (!) 103 100  Resp:  (!) 24  18  Temp: (!) 103.2 F (39.6 C)   99.1 F (37.3 C)  TempSrc: Oral   Oral  SpO2: 96%  96% 96%  Weight:      Height:       Wt Readings from Last 3 Encounters:  09/21/22 78.2 kg  09/12/22 78.2 kg  09/12/22 78 kg   Body mass index is 28.69 kg/m.  General:  Average built, not in obvious distress, alert awake and Communicative, HENT: Normocephalic, No scleral pallor or icterus noted. Oral mucosa is moist.  Chest:  Clear breath sounds.  . No crackles or wheezes.  Right chest wall Port-A-Cath in place. CVS: S1 &S2 heard. No murmur.  Regular rate and rhythm. Abdomen: Soft, nontender, nondistended.  Bowel sounds are heard. No  abdominal mass palpated Extremities: No cyanosis, clubbing or edema.  Peripheral pulses are palpable. Psych: Alert, awake and oriented, normal mood CNS:  No cranial nerve deficits.  Power equal in all extremities.   Skin: Warm and dry.  No rashes noted.  Labs on Admission:   CBC: Recent Labs  Lab 09/21/22 0914  WBC 1.2*  NEUTROABS 0.9*  HGB 10.4*  HCT 30.5*  MCV 85.7  PLT 21*    Basic Metabolic Panel: Recent Labs  Lab 09/21/22 0914  NA 137  K 4.0  CL 105  CO2 22  GLUCOSE 194*  BUN 12  CREATININE 0.84  CALCIUM 9.1    Liver Function Tests: Recent Labs  Lab 09/21/22 0914  AST 41  ALT 63*  ALKPHOS 57  BILITOT 0.7  PROT 6.6  ALBUMIN 3.8   Recent Labs  Lab 09/21/22 0914  LIPASE 27   No results for input(s): "AMMONIA" in the last 168 hours.  Cardiac Enzymes: No results for input(s): "CKTOTAL", "CKMB", "CKMBINDEX", "TROPONINI" in the last 168 hours.  BNP (last 3 results) No results for input(s): "BNP" in the last 8760 hours.  ProBNP (last 3 results) No results for input(s): "PROBNP" in the last 8760 hours.  CBG: No results for input(s): "GLUCAP" in the last 168 hours.  Lipase     Component Value Date/Time   LIPASE 27 09/21/2022 0914     Urinalysis    Component Value Date/Time   COLORURINE YELLOW 09/21/2022 1155   APPEARANCEUR CLEAR 09/21/2022 1155   LABSPEC 1.014 09/21/2022 1155   PHURINE 7.0 09/21/2022 1155   GLUCOSEU NEGATIVE 09/21/2022 1155   HGBUR NEGATIVE 09/21/2022 1155   BILIRUBINUR NEGATIVE 09/21/2022 1155   KETONESUR 5 (A) 09/21/2022 1155   PROTEINUR 30 (A) 09/21/2022 1155   NITRITE NEGATIVE 09/21/2022 1155   LEUKOCYTESUR NEGATIVE 09/21/2022 1155     Drugs of Abuse  No results found for: "LABOPIA", "COCAINSCRNUR", "LABBENZ", "AMPHETMU", "THCU", "LABBARB"    Radiological Exams on Admission: DG Chest Portable 1 View  Result Date: 09/21/2022 CLINICAL DATA:  Shortness of breath EXAM: PORTABLE CHEST 1 VIEW COMPARISON:   08/30/2022 FINDINGS: Right chest port remains in place. Stable heart size. No focal airspace consolidation, pleural effusion,  or pneumothorax. Surgical clips again noted in the left supraclavicular region. IMPRESSION: No active disease. Electronically Signed   By: Davina Poke D.O.   On: 09/21/2022 09:12    EKG: Not available for review.   Consultant: None  Code Status: Full code  Microbiology blood culture and urine culture sent from the ED  Antibiotics: Daptomycin and cefepime  Family Communication:  Patients' condition and plan of care including tests being ordered have been discussed with the patient and the patient's daughter at bedside who indicate understanding and agree with the plan.   Status is: Inpatient  Severity of Illness: The appropriate patient status for this patient is INPATIENT. Inpatient status is judged to be reasonable and necessary in order to provide the required intensity of service to ensure the patient's safety. The patient's presenting symptoms, physical exam findings, and initial radiographic and laboratory data in the context of their chronic comorbidities is felt to place them at high risk for further clinical deterioration. Furthermore, it is not anticipated that the patient will be medically stable for discharge from the hospital within 2 midnights of admission. I certify that at the point of admission it is my clinical judgment that the patient will require inpatient hospital care spanning beyond 2 midnights from the point of admission due to high intensity of service, high risk for further deterioration and high frequency of surveillance required.  Signed, Flora Lipps, MD [facility] 09/21/2022

## 2022-09-21 NOTE — ED Provider Notes (Signed)
[facility] EMERGENCY DEPARTMENT Provider Note   CSN: [id number] Arrival date & time: 09/21/22  6503     History Chief Complaint  Patient presents with   Fever    HPI Debbie Hall is a 61 y.o. female presenting for fever, cough, congestion, malaise and intermittent altered mental status per daughter.  61 year old female with lymphoma.  She is on her third cycle of chemotherapy.  Last dose 10 days ago.  She has had fevers in the interim.  Temperature of 103 at home today.  No known sick contacts.  Otherwise ambulatory tolerating p.o. intake. No focal symptoms other than intermittent sore throat..   Patient's recorded medical, surgical, social, medication list and allergies were reviewed in the Snapshot window as part of the initial history.   Review of Systems   Review of Systems  Constitutional:  Positive for fever. Negative for chills.  HENT:  Positive for congestion and sore throat. Negative for ear pain.   Eyes:  Negative for pain and visual disturbance.  Respiratory:  Positive for cough. Negative for shortness of breath.   Cardiovascular:  Negative for chest pain and palpitations.  Gastrointestinal:  Negative for abdominal pain and vomiting.  Genitourinary:  Negative for dysuria and hematuria.  Musculoskeletal:  Negative for arthralgias and back pain.  Skin:  Negative for color change and rash.  Neurological:  Negative for seizures and syncope.  All other systems reviewed and are negative.   Physical Exam Updated Vital Signs BP (!) 155/77   Pulse (!) 127   Temp (!) 103.2 F (39.6 C) (Oral)   Resp (!) 24   Ht '5\' 5"'$  (1.651 m)   Wt 78.2 kg   SpO2 96%   BMI 28.69 kg/m  Physical Exam Vitals and nursing note reviewed.  Constitutional:      General: She is not in acute distress.    Appearance: She is well-developed.  HENT:     Head: Normocephalic and atraumatic.     Mouth/Throat:     Pharynx: Posterior oropharyngeal erythema present.  Eyes:      Conjunctiva/sclera: Conjunctivae normal.  Cardiovascular:     Rate and Rhythm: Normal rate and regular rhythm.     Heart sounds: No murmur heard. Pulmonary:     Effort: Pulmonary effort is normal. No respiratory distress.     Breath sounds: Normal breath sounds.  Abdominal:     General: There is no distension.     Palpations: Abdomen is soft.     Tenderness: There is no abdominal tenderness. There is no right CVA tenderness or left CVA tenderness.  Musculoskeletal:        General: No swelling or tenderness. Normal range of motion.     Cervical back: Neck supple.  Skin:    General: Skin is warm and dry.  Neurological:     General: No focal deficit present.     Mental Status: She is alert and oriented to person, place, and time. Mental status is at baseline.     Cranial Nerves: No cranial nerve deficit.      ED Course/ Medical Decision Making/ A&P    Procedures Procedures   Medications Ordered in ED Medications  acetaminophen (TYLENOL) tablet 650 mg (650 mg Oral Given 09/21/22 0846)    Medical Decision Making:    Debbie Hall is a 61 y.o. female who presented to the ED today with multiple symptoms detailed above.     Additional history discussed with patient's family/caregivers.  Patient's presentation is complicated by  their history of active chemotherapy..  Patient placed on continuous vitals and telemetry monitoring while in ED which was reviewed periodically.   Complete initial physical exam performed, notably the patient  was tachycardic, febrile to 103 triggering sepsis..      Reviewed and confirmed nursing documentation for past medical history, family history, social history.    Initial Assessment:   With the patient's presentation of ***, most likely diagnosis is ***. Other diagnoses were considered including (but not limited to) ***. These are considered less likely due to history of present illness and physical exam findings.   {crccopa:27899}  Initial  Plan:  ***  ***Screening labs including CBC and Metabolic panel to evaluate for infectious or metabolic etiology of disease.  ***Urinalysis with reflex culture ordered to evaluate for UTI or relevant urologic/nephrologic pathology.  ***CXR to evaluate for structural/infectious intrathoracic pathology.  ***EKG to evaluate for cardiac pathology. Objective evaluation as below reviewed with plan for close reassessment  Initial Study Results:   Laboratory  All laboratory results reviewed without evidence of clinically relevant pathology.   ***Exceptions include: ***   ***EKG EKG was reviewed independently. Rate, rhythm, axis, intervals all examined and without medically relevant abnormality. ST segments without concerns for elevations.    Radiology  All images reviewed independently. ***Agree with radiology report at this time.   No results found.   Consults:  Case discussed with ***.   Final Assessment and Plan:   ***   ***  Clinical Impression: No diagnosis found.   Data Unavailable   Final Clinical Impression(s) / ED Diagnoses Final diagnoses:  None    Rx / DC Orders ED Discharge Orders     None

## 2022-09-22 DIAGNOSIS — C8338 Diffuse large B-cell lymphoma, lymph nodes of multiple sites: Secondary | ICD-10-CM | POA: Diagnosis not present

## 2022-09-22 DIAGNOSIS — E119 Type 2 diabetes mellitus without complications: Secondary | ICD-10-CM | POA: Diagnosis not present

## 2022-09-22 DIAGNOSIS — R509 Fever, unspecified: Secondary | ICD-10-CM | POA: Diagnosis not present

## 2022-09-22 DIAGNOSIS — U071 COVID-19: Secondary | ICD-10-CM | POA: Insufficient documentation

## 2022-09-22 DIAGNOSIS — I10 Essential (primary) hypertension: Secondary | ICD-10-CM | POA: Diagnosis not present

## 2022-09-22 LAB — CBC
HCT: 25.9 % — ABNORMAL LOW (ref 36.0–46.0)
HCT: 27.6 % — ABNORMAL LOW (ref 36.0–46.0)
Hemoglobin: 8.5 g/dL — ABNORMAL LOW (ref 12.0–15.0)
Hemoglobin: 9 g/dL — ABNORMAL LOW (ref 12.0–15.0)
MCH: 28.8 pg (ref 26.0–34.0)
MCH: 28.8 pg (ref 26.0–34.0)
MCHC: 32.8 g/dL (ref 30.0–36.0)
MCHC: 32.6 g/dL (ref 30.0–36.0)
MCV: 87.8 fL (ref 80.0–100.0)
MCV: 88.5 fL (ref 80.0–100.0)
Platelets: 29 10*3/uL — CL (ref 150–400)
Platelets: 38 10*3/uL — ABNORMAL LOW (ref 150–400)
RBC: 2.95 MIL/uL — ABNORMAL LOW (ref 3.87–5.11)
RBC: 3.12 MIL/uL — ABNORMAL LOW (ref 3.87–5.11)
RDW: 13 % (ref 11.5–15.5)
RDW: 13.1 % (ref 11.5–15.5)
WBC: 2.3 10*3/uL — ABNORMAL LOW (ref 4.0–10.5)
WBC: 3.3 10*3/uL — ABNORMAL LOW (ref 4.0–10.5)
nRBC: 0 % (ref 0.0–0.2)
nRBC: 0 % (ref 0.0–0.2)

## 2022-09-22 LAB — PROTIME-INR
INR: 1.1 (ref 0.8–1.2)
Prothrombin Time: 14 seconds (ref 11.4–15.2)

## 2022-09-22 MED ORDER — GUAIFENESIN-DM 100-10 MG/5ML PO SYRP
5.0000 mL | ORAL_SOLUTION | ORAL | Status: DC | PRN
  Administered 2022-09-22 – 2022-09-23 (×5): 5 mL via ORAL
  Filled 2022-09-22 (×5): qty 5

## 2022-09-22 NOTE — Progress Notes (Signed)
Pt pleasant and cooperative this shift. No c/o pain or discomfort. All needs met. Call bell in reach. Plan of care ongoing.

## 2022-09-22 NOTE — Progress Notes (Signed)
  Transition of Care [facility]) Screening Note   Patient Details  Name: Debbie Hall Date of Birth: November 23, 1960   Transition of Care [facility]) CM/SW Contact:    Iona Beard, [place] Phone Number: 09/22/2022, 1:59 PM    Transition of Care Department [facility]) has reviewed patient and no TOC needs have been identified at this time. We will continue to monitor patient advancement through interdisciplinary progression rounds. If new patient transition needs arise, please place a TOC consult.

## 2022-09-22 NOTE — Progress Notes (Signed)
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  Patient denies any nausea, vomiting, diarrhea.  She however complains of oral ulcerations and was supposed to be on some oral preparation as outpatient.  She denied any skin rash or recent travel.  No Port-A-Cath site tenderness or pain.  Denies dysphagia.  Denies any chest pain, palpitation, syncope.  Patient denies urinary urgency, frequency or dysuria.  Denies any changes in appetite.  Had some oral ulceration but denies any dysphagia.   In the ED, patient was febrile with a temperature of 103.2 F with mild tachycardia and tachypnea.  Initial labs showed lactate elevated at 2.2.  CBC showed mild leukopenia, a hemoglobin of 10.4 with platelet of 21 K.  Chest x-ray done in the ED was negative for acute infiltrate.  COVID-19 PCR was positive.  Patient was then considered for admission to hospital for further evaluation and treatment.     Assessment and Plan:  Sepsis secondary to COVID-19 infection.   No hypoxia at this time.  Continue symptomatic care.  Will need to rule out bacterial infection.  Follow pancultures.  Blood cultures negative in 12 hours.  Temperature max of 103.2 F.  Temperature this morning at 99.8.  WBC count has slightly improved.Marland Kitchen  LFTs within normal limits.  No hypoxia or infiltrate.  Will provide symptomatic care.  Patient had fever, leukopenia tachypnea with elevated lactate with COVID-19 infection suggestive of  sepsis.  Chest x-ray did not show any infiltrate.   Fever with pancytopenia follow cultures.  Negative so far..  Patient is currently on broad-spectrum antibiotic with daptomycin and cefepime.  Allergic  to vancomycin.  Will discontinue/de-escalate antibiotic depending upon culture sensitivity and fever trend.  Port-A-Cath site appears healthy.  Will closely monitor for bleeding.  Patient has severe thrombocytopenia.  Latest platelet count at 90.  No evidence of bleeding.   Diffuse B-cell lymphoma on chemotherapy.  Recent chemotherapy on the 18th.  Follows up with oncology as outpatient.     Oral ulceration.  Could be source of bacteremia and fever.  Will follow cultures.  Oral care with sucralfate and lidocaine.  Better.   Diabetes mellitus type 2.  On insulin at home with Lantus 10 units.  Was on sliding scale during prednisone usage.  Sliding scale insulin.  Hemoglobin A1c 3 weeks back was 5.9.  Hypothyroidism.  Continue Synthroid.     DVT prophylaxis: SCDs Start: 09/21/22 1611   Code Status:     Code Status: Full Code  Disposition: Home likely in 1 to 2 days Status is: Inpatient Remains inpatient appropriate because: Fever, immunocompromised patient, IV antibiotic,   Family Communication: Communicated with the patient's daughter at bedside on 09/21/2022  Consultants:  None  Procedures:  None  Antimicrobials:  Daptomycin and cefepime.  Anti-infectives (From admission, onward)    Start     Dose/Rate Route Frequency Ordered Stop   09/21/22 1800  ceFEPIme (MAXIPIME) 2 g in sodium chloride 0.9 % 100 mL IVPB        2 g 200 mL/hr over 30 Minutes Intravenous Every 8 hours 09/21/22 1605     09/21/22 1300  DAPTOmycin (CUBICIN) 450 mg in sodium chloride 0.9 % IVPB  8 mg/kg  57 kg (Ideal) 118 mL/hr over 30 Minutes Intravenous Daily 09/21/22 1156     09/21/22 0930  vancomycin (VANCOREADY) IVPB 1750 mg/350 mL  Status:  Discontinued        1,750 mg 175 mL/hr over 120 Minutes Intravenous  Once 09/21/22 0919 09/21/22 1154   09/21/22 0900  ceFEPIme (MAXIPIME) 2 g in sodium chloride 0.9 % 100 mL IVPB        2 g 200 mL/hr over 30 Minutes Intravenous  Once 09/21/22 0848 09/21/22  0945       Subjective: Today, patient was seen and examined at bedside.  Complains of mild nausea but no vomiting.  No fever chills shortness of breath chest pain but has mild cough with brown productive of sputum..  No diarrhea urinary urgency frequency.  Objective: Vitals:   09/21/22 1631 09/21/22 2136 09/22/22 0424 09/22/22 0500  BP: 104/64 (!) 115/57 (!) 105/55   Pulse: 96 89 91   Resp: '20 18 18   '$ Temp: 99.1 F (37.3 C) 97.7 F (36.5 C) 99.8 F (37.7 C)   TempSrc: Oral Oral Oral   SpO2: 99% 96% 96%   Weight:    75 kg  Height: '5\' 5"'$  (1.651 m)       Intake/Output Summary (Last 24 hours) at 09/22/2022 1356 Last data filed at 09/22/2022 0900 Gross per 24 hour  Intake 1290 ml  Output --  Net 1290 ml   Filed Weights   09/21/22 0841 09/22/22 0500  Weight: 78.2 kg 75 kg    Physical Examination: Body mass index is 27.51 kg/m.  General:  Average built, not in obvious distress, on nasal cannula oxygen HENT:   No scleral pallor or icterus noted. Oral mucosa is moist.  Chest:    Diminished breath sounds bilaterally. CVS: S1 &S2 heard. No murmur.  Regular rate and rhythm. Abdomen: Soft, nontender, nondistended.  Bowel sounds are heard.   Extremities: No cyanosis, clubbing or edema.  Peripheral pulses are palpable. Psych: Alert, awake and oriented, normal mood CNS:  No cranial nerve deficits.  Power equal in all extremities.   Skin: Warm and dry.  No rashes noted.  Data Reviewed:   CBC: Recent Labs  Lab 09/21/22 0914 09/22/22 0358  WBC 1.2* 2.3*  NEUTROABS 0.9*  --   HGB 10.4* 8.5*  HCT 30.5* 25.9*  MCV 85.7 87.8  PLT 21* 29*    Basic Metabolic Panel: Recent Labs  Lab 09/21/22 0914  NA 137  K 4.0  CL 105  CO2 22  GLUCOSE 194*  BUN 12  CREATININE 0.84  CALCIUM 9.1    Liver Function Tests: Recent Labs  Lab 09/21/22 0914  AST 41  ALT 63*  ALKPHOS 57  BILITOT 0.7  PROT 6.6  ALBUMIN 3.8     Radiology Studies: DG Chest Portable 1  View  Result Date: 09/21/2022 CLINICAL DATA:  Shortness of breath EXAM: PORTABLE CHEST 1 VIEW COMPARISON:  08/30/2022 FINDINGS: Right chest port remains in place. Stable heart size. No focal airspace consolidation, pleural effusion, or pneumothorax. Surgical clips again noted in the left supraclavicular region. IMPRESSION: No active disease. Electronically Signed   By: Davina Poke D.O.   On: 09/21/2022 09:12      LOS: 1 day    Flora Lipps, MD [facility] Available via Epic secure chat 7am-7pm After these hours, please refer to coverage provider listed on [email or web address] 09/22/2022, 1:56 PM

## 2022-09-23 ENCOUNTER — Other Ambulatory Visit: Payer: Self-pay

## 2022-09-23 DIAGNOSIS — U071 COVID-19: Secondary | ICD-10-CM | POA: Diagnosis not present

## 2022-09-23 DIAGNOSIS — R509 Fever, unspecified: Secondary | ICD-10-CM | POA: Diagnosis not present

## 2022-09-23 DIAGNOSIS — C8338 Diffuse large B-cell lymphoma, lymph nodes of multiple sites: Secondary | ICD-10-CM | POA: Diagnosis not present

## 2022-09-23 DIAGNOSIS — E119 Type 2 diabetes mellitus without complications: Secondary | ICD-10-CM | POA: Diagnosis not present

## 2022-09-23 LAB — BASIC METABOLIC PANEL
Anion gap: 7 (ref 5–15)
BUN: 10 mg/dL (ref 8–23)
CO2: 23 mmol/L (ref 22–32)
Calcium: 8.3 mg/dL — ABNORMAL LOW (ref 8.9–10.3)
Chloride: 109 mmol/L (ref 98–111)
Creatinine, Ser: 0.68 mg/dL (ref 0.44–1.00)
GFR, Estimated: >60 mL/min (ref >60)
Glucose, Bld: 131 mg/dL — ABNORMAL HIGH (ref 70–99)
Potassium: 3.4 mmol/L — ABNORMAL LOW (ref 3.5–5.1)
Sodium: 139 mmol/L (ref 135–145)

## 2022-09-23 LAB — GLUCOSE, CAPILLARY: Glucose-Capillary: 109 mg/dL — ABNORMAL HIGH (ref 70–99)

## 2022-09-23 LAB — MAGNESIUM: Magnesium: 1.8 mg/dL (ref 1.7–2.4)

## 2022-09-23 MED ORDER — INSULIN ASPART 100 UNIT/ML IJ SOLN: 0.0000 [IU] | Freq: Three times a day (TID) | INTRAMUSCULAR | Status: DC

## 2022-09-23 MED ORDER — POTASSIUM CHLORIDE CRYS ER 20 MEQ PO TBCR: 20.0000 meq | EXTENDED_RELEASE_TABLET | Freq: Every day | ORAL | 0 refills | Status: DC

## 2022-09-23 MED ORDER — DOXYCYCLINE HYCLATE 100 MG PO TABS: 100.0000 mg | ORAL_TABLET | Freq: Two times a day (BID) | ORAL | 0 refills | Status: AC

## 2022-09-23 MED ORDER — POTASSIUM CHLORIDE CRYS ER 20 MEQ PO TBCR
40.0000 meq | EXTENDED_RELEASE_TABLET | Freq: Once | ORAL | Status: DC
  Filled 2022-09-23: qty 2

## 2022-09-23 MED ORDER — GUAIFENESIN-DM 100-10 MG/5ML PO SYRP: 5.0000 mL | ORAL_SOLUTION | ORAL | 0 refills | Status: DC | PRN

## 2022-09-23 NOTE — Progress Notes (Signed)
Patient discharge discussed with patient, reviewed meds. Patient states she still feels quite poorly, but per provider she will take a while to feel improved. No questions concerning discharge. Awaiting pickup by daughter.

## 2022-09-23 NOTE — Discharge Summary (Signed)
Physician Discharge Summary  Debbie Hall KDX:[id number] DOB: [birth year] DOA: 09/21/2022  PCP: Tempie Hoist, FNP  Admit date: 09/21/2022 Discharge date: 09/23/2022  Admitted From: Home  Discharge disposition: Home   Recommendations for Outpatient Follow-Up:   Follow up with your primary care provider in one week.  Check CBC, BMP, magnesium in the next visit Follow-up with your oncologist as scheduled by you.  Discharge Diagnosis:   Principal Problem:   Fever Active Problems:   Diffuse large B cell lymphoma (HCC)   Port-A-Cath in place   Essential hypertension   Diabetes mellitus ([place])   COVID-19 virus infection   Discharge Condition: Improved.  Diet recommendation:   Regular.  Wound care: None.  Code status: Full.   History of Present Illness:   Debbie Hall is a 61 year old female with past medical history of hypertension, hyperlipidemia, diabetes melitis type II, diffuse B-cell lymphoma on chemotherapy last dose on the 18th presented to hospital with fever, cough and congestion oral ulcerations.  In the ED, patient was febrile with a temperature of 103.2 F with mild tachycardia and tachypnea.  Initial labs showed lactate elevated at 2.2.  CBC showed mild leukopenia, a hemoglobin of 10.4 with platelet of 21 K.  Chest x-ray done in the ED was negative for acute infiltrate.  COVID-19 PCR was positive.  Patient was then considered for admission to hospital for further evaluation and treatment.    Hospital Course:   Following conditions were addressed during hospitalization as listed below,  Sepsis secondary to COVID-19 infection.   Patient was not hypoxic.  Pancultures were sent which were negative.  Fever improved.  There was no other source of infection.  Fever was thought to be secondary to COVID-19 infection.  Antibiotics have been discontinued at this time except for doxycycline for possible bronchitis. Chest x-ray did not show any infiltrate.  Patient has  clinically improved.  Acute bronchitis.  Will continue doxycycline on discharge.   Fever with pancytopenia initially on broad-spectrum antibiotic with daptomycin and cefepime.  Has been de-escalated to doxycycline on discharge for possible bronchitis.  Fever has improved including blood counts.    Diffuse B-cell lymphoma on chemotherapy.  Recent chemotherapy on the 18th.  Follows up with oncology as outpatient.     Oral ulceration.  Continue Carafate mouthwash on discharge.  Improved.   Diabetes mellitus type 2.  Will resume insulin regimen from home..  Hemoglobin A1c 3 weeks back was 5.9.   Hypothyroidism.  Continue Synthroid.  Disposition.  At this time, patient is stable for disposition home with outpatient PCP and oncology follow-up.  Medical Consultants:   None.  Procedures:    None Subjective:   Today, patient was seen and examined at bedside.  Feels better.  No fever chills or rigor.  Has mild weakness but no chest pain.  She however has some cough with some productive sputum.  Discharge Exam:   Vitals:   09/22/22 2136 09/23/22 0326  BP: (!) 107/58 (!) 118/51  Pulse: 84 86  Resp: 17 18  Temp: 98.3 F (36.8 C) 98.3 F (36.8 C)  SpO2: 97% 94%   Vitals:   09/22/22 0424 09/22/22 0500 09/22/22 2136 09/23/22 0326  BP: (!) 105/55  (!) 107/58 (!) 118/51  Pulse: 91  84 86  Resp: '18  17 18  '$ Temp: 99.8 F (37.7 C)  98.3 F (36.8 C) 98.3 F (36.8 C)  TempSrc: Oral  Oral Oral  SpO2: 96%  97% 94%  Weight:  75  kg  75 kg  Height:       Body mass index is 27.51 kg/m.  General: Alert awake, not in obvious distress HENT: pupils equally reacting to light,  No scleral pallor or icterus noted. Oral mucosa is moist.  Oral ulceration noted. Chest:  Clear breath sounds.  Diminished breath sounds bilaterally. No crackles or wheezes.  Right chest wall port in place. CVS: S1 &S2 heard. No murmur.  Regular rate and rhythm. Abdomen: Soft, nontender, nondistended.  Bowel sounds  are heard.   Extremities: No cyanosis, clubbing or edema.  Peripheral pulses are palpable. Psych: Alert, awake and oriented, normal mood CNS:  No cranial nerve deficits.  Power equal in all extremities.   Skin: Warm and dry.  No rashes noted.  The results of significant diagnostics from this hospitalization (including imaging, microbiology, ancillary and laboratory) are listed below for reference.     Diagnostic Studies:   DG Chest Portable 1 View  Result Date: 09/21/2022 CLINICAL DATA:  Shortness of breath EXAM: PORTABLE CHEST 1 VIEW COMPARISON:  08/30/2022 FINDINGS: Right chest port remains in place. Stable heart size. No focal airspace consolidation, pleural effusion, or pneumothorax. Surgical clips again noted in the left supraclavicular region. IMPRESSION: No active disease. Electronically Signed   By: Davina Poke D.O.   On: 09/21/2022 09:12     Labs:   Basic Metabolic Panel: Recent Labs  Lab 09/21/22 0914 09/23/22 0535  NA 137 139  K 4.0 3.4*  CL 105 109  CO2 22 23  GLUCOSE 194* 131*  BUN 12 10  CREATININE 0.84 0.68  CALCIUM 9.1 8.3*  MG  --  1.8   GFR Estimated Creatinine Clearance: 72.9 mL/min (by C-G formula based on SCr of 0.68 mg/dL). Liver Function Tests: Recent Labs  Lab 09/21/22 0914  AST 41  ALT 63*  ALKPHOS 57  BILITOT 0.7  PROT 6.6  ALBUMIN 3.8   Recent Labs  Lab 09/21/22 0914  LIPASE 27   No results for input(s): "AMMONIA" in the last 168 hours. Coagulation profile Recent Labs  Lab 09/22/22 0358  INR 1.1    CBC: Recent Labs  Lab 09/21/22 0914 09/22/22 0358 09/22/22 1445  WBC 1.2* 2.3* 3.3*  NEUTROABS 0.9*  --   --   HGB 10.4* 8.5* 9.0*  HCT 30.5* 25.9* 27.6*  MCV 85.7 87.8 88.5  PLT 21* 29* 38*   Cardiac Enzymes: No results for input(s): "CKTOTAL", "CKMB", "CKMBINDEX", "TROPONINI" in the last 168 hours. BNP: Invalid input(s): "POCBNP" CBG: Recent Labs  Lab 09/23/22 0756  GLUCAP 109*   D-Dimer No results for  input(s): "DDIMER" in the last 72 hours. Hgb A1c No results for input(s): "HGBA1C" in the last 72 hours. Lipid Profile No results for input(s): "CHOL", "HDL", "LDLCALC", "TRIG", "CHOLHDL", "LDLDIRECT" in the last 72 hours. Thyroid function studies No results for input(s): "TSH", "T4TOTAL", "T3FREE", "THYROIDAB" in the last 72 hours.  Invalid input(s): "FREET3" Anemia work up No results for input(s): "VITAMINB12", "FOLATE", "FERRITIN", "TIBC", "IRON", "RETICCTPCT" in the last 72 hours. Microbiology Recent Results (from the past 240 hour(s))  Resp panel by RT-PCR (RSV, Flu A&B, Covid) Anterior Nasal Swab     Status: Abnormal   Collection Time: 09/21/22  8:45 AM   Specimen: Anterior Nasal Swab  Result Value Ref Range Status   SARS Coronavirus 2 by RT PCR POSITIVE (A) NEGATIVE Final    Comment: (NOTE) SARS-CoV-2 target nucleic acids are DETECTED.  The SARS-CoV-2 RNA is generally detectable in upper respiratory  specimens during the acute phase of infection. Positive results are indicative of the presence of the identified virus, but do not rule out bacterial infection or co-infection with other pathogens not detected by the test. Clinical correlation with patient history and other diagnostic information is necessary to determine patient infection status. The expected result is Negative.  Fact Sheet for Patients: EntrepreneurPulse.com.au  Fact Sheet for Healthcare Providers: IncredibleEmployment.be  This test is not yet approved or cleared by the [place] FDA and  has been authorized for detection and/or diagnosis of SARS-CoV-2 by FDA under an Emergency Use Authorization (EUA).  This EUA will remain in effect (meaning this test can be used) for the duration of  the COVID-19 declaration under Section 564(b)(1) of the A ct, 21 U.S.C. section 360bbb-3(b)(1), unless the authorization is terminated or revoked sooner.     Influenza A by PCR  NEGATIVE NEGATIVE Final   Influenza B by PCR NEGATIVE NEGATIVE Final    Comment: (NOTE) The Xpert Xpress SARS-CoV-2/FLU/RSV plus assay is intended as an aid in the diagnosis of influenza from Nasopharyngeal swab specimens and should not be used as a sole basis for treatment. Nasal washings and aspirates are unacceptable for Xpert Xpress SARS-CoV-2/FLU/RSV testing.  Fact Sheet for Patients: EntrepreneurPulse.com.au  Fact Sheet for Healthcare Providers: IncredibleEmployment.be  This test is not yet approved or cleared by the [place] FDA and has been authorized for detection and/or diagnosis of SARS-CoV-2 by FDA under an Emergency Use Authorization (EUA). This EUA will remain in effect (meaning this test can be used) for the duration of the COVID-19 declaration under Section 564(b)(1) of the Act, 21 U.S.C. section 360bbb-3(b)(1), unless the authorization is terminated or revoked.     Resp Syncytial Virus by PCR NEGATIVE NEGATIVE Final    Comment: (NOTE) Fact Sheet for Patients: EntrepreneurPulse.com.au  Fact Sheet for Healthcare Providers: IncredibleEmployment.be  This test is not yet approved or cleared by the [place] FDA and has been authorized for detection and/or diagnosis of SARS-CoV-2 by FDA under an Emergency Use Authorization (EUA). This EUA will remain in effect (meaning this test can be used) for the duration of the COVID-19 declaration under Section 564(b)(1) of the Act, 21 U.S.C. section 360bbb-3(b)(1), unless the authorization is terminated or revoked.  Performed at [facility], [address], [place], [place] [address]   Blood culture (routine x 2)     Status: None (Preliminary result)   Collection Time: 09/21/22  9:10 AM   Specimen: BLOOD  Result Value Ref Range Status   Specimen Description BLOOD RIGHT ASSIST CONTROL  Final   Special Requests   Final    BOTTLES DRAWN AEROBIC  AND ANAEROBIC Blood Culture adequate volume   Culture   Final    NO GROWTH 1 DAY Performed at [facility], [address], [place], [place] [address]    Report Status PENDING  Incomplete  Blood culture (routine x 2)     Status: None (Preliminary result)   Collection Time: 09/21/22  9:14 AM   Specimen: BLOOD  Result Value Ref Range Status   Specimen Description BLOOD LEFT ASSIST CONTROL  Final   Special Requests   Final    BOTTLES DRAWN AEROBIC AND ANAEROBIC Blood Culture results may not be optimal due to an excessive volume of blood received in culture bottles   Culture   Final    NO GROWTH 1 DAY Performed at [facility], [address] [place], [place] [address]    Report Status PENDING  Incomplete     Discharge Instructions:   Discharge Instructions     Call MD for:  persistant nausea and vomiting   Complete by: As directed    Call MD for:  severe uncontrolled pain   Complete by: As directed    Call MD for:  temperature >100.4   Complete by: As directed    Diet general   Complete by: As directed    Discharge instructions   Complete by: As directed    Follow-up with your primary care physician in 1 week.  Check blood work at that time.  Follow-up with your oncologist as scheduled by you.  Seek medical attention for worsening symptoms.  Increase fluid intake.   Increase activity slowly   Complete by: As directed       Allergies as of 09/23/2022       Reactions   Avocado Itching   Banana Itching   Latex Itching   Eyes watering, itching.   Lisinopril    angioedema   Penicillins    Facial swelling   Polivy [polatuzumab Vedotin] Other (See Comments)   Dry throat and trouble catching her breath.   Rituxan [rituximab]    Itching in her throat    Vancomycin Itching        Medication List     TAKE these medications    allopurinol 300 MG tablet Commonly known as: ZYLOPRIM Take 1 tablet (300 mg total) by mouth daily.   ALPRAZolam 1 MG tablet Commonly  known as: XANAX Take 1 mg by mouth 3 (three) times daily as needed for anxiety or sleep.   CYCLOPHOSPHAMIDE IV Inject into the vein every 21 ( twenty-one) days.   DOXORUBICIN HCL IV Inject into the vein every 21 ( twenty-one) days.   doxycycline 100 MG tablet Commonly known as: VIBRA-TABS Take 1 tablet (100 mg total) by mouth 2 (two) times daily for 5 days.   Fish Oil 1000 MG Caps Take by mouth.   guaiFENesin-dextromethorphan 100-10 MG/5ML syrup Commonly known as: ROBITUSSIN DM Take 5 mLs by mouth every 4 (four) hours as needed for cough.   HUMALOG KWIKPEN [place] Inject 2-10 Units into the skin daily as needed (blood sugar). Sliding scale   ibuprofen 200 MG tablet Commonly known as: ADVIL Take 600 mg by mouth every 6 (six) hours as needed for moderate pain.   Insulin Glargine w/ Trans Port 100 UNIT/ML Sopn Inject 10 Units into the skin daily.   levothyroxine 50 MCG tablet Commonly known as: SYNTHROID Take 50 mcg by mouth daily.   Lexapro 5 MG tablet Generic drug: escitalopram Take 1 tablet by mouth daily.   lidocaine 2 % solution Commonly known as: XYLOCAINE Mix 10 ml with 10 ml of Carafate an swish and swallow 4 times daily   lidocaine-prilocaine cream Commonly known as: EMLA Apply 1 Application topically as needed (Local anesthesia for port).   morphine 10 MG/5ML solution Take 2.5 mLs (5 mg total) by mouth every 2 (two) hours as needed for severe pain. Dilute in 1tsp of water and swish, then spit   ondansetron 4 MG tablet Commonly known as: ZOFRAN Take 1 tablet (4 mg total) by mouth every 8 (eight) hours as needed for nausea or vomiting.   pantoprazole 40 MG tablet Commonly known as: Protonix Take 1 tablet (40 mg total) by mouth daily.   POLIVY IV Inject into the vein every 21 ( twenty-one) days.   potassium chloride SA 20 MEQ tablet Commonly known as: Rhetta Mura  Take 1 tablet (20 mEq total) by mouth daily for 5 days.   predniSONE 20 MG tablet Commonly  known as: DELTASONE Take 100 mg (5 tablets) by mouth daily with food on days 1-5 of chemotherapy   promethazine 25 MG tablet Commonly known as: PHENERGAN Take 1 tablet (25 mg total) by mouth every 6 (six) hours as needed for nausea or vomiting.   RITUXIMAB IV Inject into the vein every 21 ( twenty-one) days.   sucralfate 1 GM/10ML suspension Commonly known as: Carafate Mix 10 ml with 10 ml of lidocaine and swish and swallow 4 times daily   VITAMIN E PO Take 1 capsule by mouth daily.        Follow-up Information     Tempie Hoist, FNP Follow up in 1 week(s).   Specialty: Family Medicine Contact information: [place] [id number] [phone]                  Time coordinating discharge: 39 minutes  Signed:  Shaylah Mcghie  [facility] 09/23/2022, 1:29 PM

## 2022-09-23 NOTE — Progress Notes (Signed)
Patient slept on and off this shift, waking due to a cough. Robitussin given twice this shift. Patient slept the rest of the night. Continued to monitor patient.

## 2022-09-26 ENCOUNTER — Other Ambulatory Visit: Payer: Self-pay

## 2022-09-26 LAB — CULTURE, BLOOD (ROUTINE X 2)
Culture: NO GROWTH
Culture: NO GROWTH
Special Requests: ADEQUATE

## 2022-09-27 ENCOUNTER — Other Ambulatory Visit: Payer: Self-pay

## 2022-09-29 ENCOUNTER — Ambulatory Visit (HOSPITAL_COMMUNITY): Payer: Managed Care, Other (non HMO)

## 2022-10-03 ENCOUNTER — Ambulatory Visit: Payer: Managed Care, Other (non HMO) | Admitting: Hematology

## 2022-10-03 ENCOUNTER — Other Ambulatory Visit: Payer: Managed Care, Other (non HMO)

## 2022-10-03 ENCOUNTER — Ambulatory Visit: Payer: Managed Care, Other (non HMO)

## 2022-10-06 ENCOUNTER — Ambulatory Visit (HOSPITAL_COMMUNITY)
Admission: RE | Admit: 2022-10-06 | Discharge: 2022-10-06 | Disposition: A | Discharge status: Home / Self Care | Payer: Managed Care, Other (non HMO) | Source: Ambulatory Visit | Attending: Hematology | Admitting: Hematology

## 2022-10-06 DIAGNOSIS — C8338 Diffuse large B-cell lymphoma, lymph nodes of multiple sites: Secondary | ICD-10-CM | POA: Insufficient documentation

## 2022-10-06 MED ORDER — FLUDEOXYGLUCOSE F - 18 (FDG) INJECTION
8.1500 | Freq: Once | INTRAVENOUS | Status: AC | PRN
  Administered 2022-10-06: 8.15 via INTRAVENOUS

## 2022-10-07 NOTE — Progress Notes (Signed)
Debbie Hall with [facility] called with report on PET scan. Impression #2. New nodular hypermetabolic airspace opacities in the left upper lobe and right mid lung, likely inflammatory/infectious. These are visible on the scout image. Chest radiographic follow-up recommended to ensure resolution and exclude atypical progression of the patient's lymphoma. Dr. Delton Coombes made aware of results. His response is she had recent Covid. Please call her and make sure she's not having new symptoms of pneumonia. Called patient and she says that she is not having any symptoms of infection. MD made aware no further actions taken.

## 2022-10-10 ENCOUNTER — Inpatient Hospital Stay (HOSPITAL_BASED_OUTPATIENT_CLINIC_OR_DEPARTMENT_OTHER): Payer: Managed Care, Other (non HMO) | Admitting: Hematology

## 2022-10-10 ENCOUNTER — Inpatient Hospital Stay: Payer: Managed Care, Other (non HMO) | Attending: Hematology

## 2022-10-10 ENCOUNTER — Encounter: Payer: Self-pay | Admitting: Hematology

## 2022-10-10 ENCOUNTER — Inpatient Hospital Stay: Payer: Managed Care, Other (non HMO)

## 2022-10-10 VITALS — BP 124/71 | HR 81 | Temp 97.9°F | Resp 18

## 2022-10-10 DIAGNOSIS — C8338 Diffuse large B-cell lymphoma, lymph nodes of multiple sites: Secondary | ICD-10-CM | POA: Diagnosis not present

## 2022-10-10 DIAGNOSIS — Z95828 Presence of other vascular implants and grafts: Secondary | ICD-10-CM | POA: Diagnosis not present

## 2022-10-10 DIAGNOSIS — Z5112 Encounter for antineoplastic immunotherapy: Secondary | ICD-10-CM | POA: Insufficient documentation

## 2022-10-10 DIAGNOSIS — Z800 Family history of malignant neoplasm of digestive organs: Secondary | ICD-10-CM | POA: Insufficient documentation

## 2022-10-10 DIAGNOSIS — Z5111 Encounter for antineoplastic chemotherapy: Secondary | ICD-10-CM | POA: Insufficient documentation

## 2022-10-10 DIAGNOSIS — Z79620 Long term (current) use of immunosuppressive biologic: Secondary | ICD-10-CM | POA: Insufficient documentation

## 2022-10-10 DIAGNOSIS — C8590 Non-Hodgkin lymphoma, unspecified, unspecified site: Secondary | ICD-10-CM

## 2022-10-10 DIAGNOSIS — R110 Nausea: Secondary | ICD-10-CM | POA: Insufficient documentation

## 2022-10-10 DIAGNOSIS — K219 Gastro-esophageal reflux disease without esophagitis: Secondary | ICD-10-CM | POA: Diagnosis not present

## 2022-10-10 DIAGNOSIS — R059 Cough, unspecified: Secondary | ICD-10-CM | POA: Diagnosis not present

## 2022-10-10 DIAGNOSIS — R7401 Elevation of levels of liver transaminase levels: Secondary | ICD-10-CM | POA: Insufficient documentation

## 2022-10-10 DIAGNOSIS — Z5189 Encounter for other specified aftercare: Secondary | ICD-10-CM | POA: Diagnosis not present

## 2022-10-10 DIAGNOSIS — Z8616 Personal history of COVID-19: Secondary | ICD-10-CM | POA: Diagnosis not present

## 2022-10-10 DIAGNOSIS — C8330 Diffuse large B-cell lymphoma, unspecified site: Secondary | ICD-10-CM | POA: Insufficient documentation

## 2022-10-10 DIAGNOSIS — Z803 Family history of malignant neoplasm of breast: Secondary | ICD-10-CM | POA: Diagnosis not present

## 2022-10-10 DIAGNOSIS — G479 Sleep disorder, unspecified: Secondary | ICD-10-CM | POA: Diagnosis not present

## 2022-10-10 DIAGNOSIS — Z79899 Other long term (current) drug therapy: Secondary | ICD-10-CM | POA: Diagnosis not present

## 2022-10-10 DIAGNOSIS — Z7952 Long term (current) use of systemic steroids: Secondary | ICD-10-CM | POA: Diagnosis not present

## 2022-10-10 LAB — CBC WITH DIFFERENTIAL/PLATELET
Abs Immature Granulocytes: 0.05 10*3/uL (ref 0.00–0.07)
Basophils Absolute: 0.1 10*3/uL (ref 0.0–0.1)
Basophils Relative: 1 %
Eosinophils Absolute: 0.2 10*3/uL (ref 0.0–0.5)
Eosinophils Relative: 4 %
HCT: 34.6 % — ABNORMAL LOW (ref 36.0–46.0)
Hemoglobin: 10.9 g/dL — ABNORMAL LOW (ref 12.0–15.0)
Immature Granulocytes: 1 %
Lymphocytes Relative: 9 %
Lymphs Abs: 0.5 10*3/uL — ABNORMAL LOW (ref 0.7–4.0)
MCH: 28.2 pg (ref 26.0–34.0)
MCHC: 31.5 g/dL (ref 30.0–36.0)
MCV: 89.6 fL (ref 80.0–100.0)
Monocytes Absolute: 0.2 10*3/uL (ref 0.1–1.0)
Monocytes Relative: 4 %
Neutro Abs: 4.4 10*3/uL (ref 1.7–7.7)
Neutrophils Relative %: 81 %
Platelets: 209 10*3/uL (ref 150–400)
RBC: 3.86 MIL/uL — ABNORMAL LOW (ref 3.87–5.11)
RDW: 15.1 % (ref 11.5–15.5)
WBC: 5.4 10*3/uL (ref 4.0–10.5)
nRBC: 0 % (ref 0.0–0.2)

## 2022-10-10 LAB — COMPREHENSIVE METABOLIC PANEL
ALT: 35 U/L (ref 0–44)
AST: 47 U/L — ABNORMAL HIGH (ref 15–41)
Albumin: 3.6 g/dL (ref 3.5–5.0)
Alkaline Phosphatase: 66 U/L (ref 38–126)
Anion gap: 11 (ref 5–15)
BUN: 11 mg/dL (ref 8–23)
CO2: 21 mmol/L — ABNORMAL LOW (ref 22–32)
Calcium: 8.8 mg/dL — ABNORMAL LOW (ref 8.9–10.3)
Chloride: 106 mmol/L (ref 98–111)
Creatinine, Ser: 0.63 mg/dL (ref 0.44–1.00)
GFR, Estimated: >60 mL/min (ref >60)
Glucose, Bld: 114 mg/dL — ABNORMAL HIGH (ref 70–99)
Potassium: 4 mmol/L (ref 3.5–5.1)
Sodium: 138 mmol/L (ref 135–145)
Total Bilirubin: 0.6 mg/dL (ref 0.3–1.2)
Total Protein: 6.5 g/dL (ref 6.5–8.1)

## 2022-10-10 LAB — MAGNESIUM: Magnesium: 2 mg/dL (ref 1.7–2.4)

## 2022-10-10 MED ORDER — PEGFILGRASTIM 6 MG/0.6ML ~~LOC~~ PSKT
6.0000 mg | PREFILLED_SYRINGE | Freq: Once | SUBCUTANEOUS | Status: AC
  Administered 2022-10-10: 6 mg via SUBCUTANEOUS
  Filled 2022-10-10: qty 0.6

## 2022-10-10 MED ORDER — SODIUM CHLORIDE 0.9% FLUSH
10.0000 mL | INTRAVENOUS | Status: DC | PRN
  Administered 2022-10-10: 10 mL

## 2022-10-10 MED ORDER — DOXORUBICIN HCL CHEMO IV INJECTION 2 MG/ML
40.0000 mg/m2 | Freq: Once | INTRAVENOUS | Status: AC
  Administered 2022-10-10: 76 mg via INTRAVENOUS
  Filled 2022-10-10: qty 38

## 2022-10-10 MED ORDER — HEPARIN SOD (PORK) LOCK FLUSH 100 UNIT/ML IV SOLN
500.0000 [IU] | Freq: Once | INTRAVENOUS | Status: AC | PRN
  Administered 2022-10-10: 500 [IU]

## 2022-10-10 MED ORDER — PALONOSETRON HCL INJECTION 0.25 MG/5ML
0.2500 mg | Freq: Once | INTRAVENOUS | Status: AC
  Administered 2022-10-10: 0.25 mg via INTRAVENOUS
  Filled 2022-10-10: qty 5

## 2022-10-10 MED ORDER — SODIUM CHLORIDE 0.9 % IV SOLN
10.0000 mg | Freq: Once | INTRAVENOUS | Status: AC
  Administered 2022-10-10: 10 mg via INTRAVENOUS
  Filled 2022-10-10: qty 10

## 2022-10-10 MED ORDER — DIPHENHYDRAMINE HCL 50 MG/ML IJ SOLN: 25.0000 mg | Freq: Once | INTRAMUSCULAR | Status: DC

## 2022-10-10 MED ORDER — SODIUM CHLORIDE 0.9 % IV SOLN
600.0000 mg/m2 | Freq: Once | INTRAVENOUS | Status: AC
  Administered 2022-10-10: 1120 mg via INTRAVENOUS
  Filled 2022-10-10: qty 56

## 2022-10-10 MED ORDER — SODIUM CHLORIDE 0.9 % IV SOLN
1.8000 mg/kg | Freq: Once | INTRAVENOUS | Status: AC
  Administered 2022-10-10: 140 mg via INTRAVENOUS
  Filled 2022-10-10: qty 7

## 2022-10-10 MED ORDER — FAMOTIDINE IN NACL 20-0.9 MG/50ML-% IV SOLN
20.0000 mg | Freq: Once | INTRAVENOUS | Status: AC
  Administered 2022-10-10: 20 mg via INTRAVENOUS
  Filled 2022-10-10: qty 50

## 2022-10-10 MED ORDER — SODIUM CHLORIDE 0.9 % IV SOLN
150.0000 mg | Freq: Once | INTRAVENOUS | Status: AC
  Administered 2022-10-10: 150 mg via INTRAVENOUS
  Filled 2022-10-10: qty 5

## 2022-10-10 MED ORDER — SODIUM CHLORIDE 0.9 % IV SOLN: Freq: Once | INTRAVENOUS | Status: AC

## 2022-10-10 MED ORDER — SODIUM CHLORIDE 0.9 % IV SOLN
375.0000 mg/m2 | Freq: Once | INTRAVENOUS | Status: AC
  Administered 2022-10-10: 700 mg via INTRAVENOUS
  Filled 2022-10-10: qty 70

## 2022-10-10 MED ORDER — SODIUM CHLORIDE 0.9% FLUSH
10.0000 mL | Freq: Once | INTRAVENOUS | Status: AC
  Administered 2022-10-10: 10 mL via INTRAVENOUS

## 2022-10-10 MED ORDER — ACETAMINOPHEN 325 MG PO TABS
650.0000 mg | ORAL_TABLET | Freq: Once | ORAL | Status: AC
  Administered 2022-10-10: 650 mg via ORAL
  Filled 2022-10-10: qty 2

## 2022-10-10 MED ORDER — CETIRIZINE HCL 10 MG/ML IV SOLN
10.0000 mg | Freq: Once | INTRAVENOUS | Status: AC
  Administered 2022-10-10: 10 mg via INTRAVENOUS
  Filled 2022-10-10: qty 1

## 2022-10-10 NOTE — Progress Notes (Signed)
Patient has been examined by Dr. Delton Coombes, and vital signs and labs have been reviewed. ANC, Creatinine, LFTs, hemoglobin, and platelets are within treatment parameters per M.D. - pt may proceed with treatment.  Adriamycin and Cytoxan dose reduced by 20% per MD. Primary RN and pharmacy notified.

## 2022-10-10 NOTE — Patient Instructions (Addendum)
[place] at [facility] Discharge Instructions   You were seen and examined today by Dr. Delton Coombes.  He reviewed the results of your PET scan which shows resolution of the lymphoma.   He reviewed the results of your labs which are normal/stable.   We will proceed with your treatment today.  Return as scheduled.    Thank you for choosing [place] at [facility] to provide your oncology and hematology care.  To afford each patient quality time with our provider, please arrive at least 15 minutes before your scheduled appointment time.   If you have a lab appointment with the [facility] please come in thru the Main Entrance and check in at the main information desk.  You need to re-schedule your appointment should you arrive 10 or more minutes late.  We strive to give you quality time with our providers, and arriving late affects you and other patients whose appointments are after yours.  Also, if you no show three or more times for appointments you may be dismissed from the clinic at the providers discretion.     Again, thank you for choosing [facility].  Our hope is that these requests will decrease the amount of time that you wait before being seen by our physicians.       _____________________________________________________________  Should you have questions after your visit to [facility], please contact our office at [phone] and follow the prompts.  Our office hours are 8:00 a.m. and 4:30 p.m. Monday - Friday.  Please note that voicemails left after 4:00 p.m. may not be returned until the following business day.  We are closed weekends and major holidays.  You do have access to a nurse 24-7, just call the main number to the clinic [phone] and do not press any options, hold on the line and a nurse will answer the phone.    For prescription refill requests, have your pharmacy contact our office and  allow 72 hours.    Due to Covid, you will need to wear a mask upon entering the hospital. If you do not have a mask, a mask will be given to you at the Main Entrance upon arrival. For doctor visits, patients may have 1 support person age 1 or older with them. For treatment visits, patients can not have anyone with them due to social distancing guidelines and our immunocompromised population.

## 2022-10-10 NOTE — Progress Notes (Signed)
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  PRIOR THERAPY: None  NGS Results: FISH results pending  CURRENT THERAPY: Pola-R-CHP  BRIEF ONCOLOGIC HISTORY:  Oncology History  Diffuse large B cell lymphoma ([place])  07/27/2022 Initial Diagnosis   Diffuse large B cell lymphoma ([place])   07/27/2022 Cancer Staging   Staging form: Hodgkin and Non-Hodgkin Lymphoma, AJCC 8th Edition - Clinical stage from 07/27/2022: Stage III (Diffuse large B-cell lymphoma) - Signed by Derek Jack, MD on 07/27/2022 Histopathologic type: Malignant lymphoma, large B-cell, diffuse, NOS Stage prefix: Initial diagnosis   08/01/2022 -  Chemotherapy   Patient is on Treatment Plan : NON-HODGKINS LYMPHOMA Pola-R-CHP (Polatuzumab + Rituximab -CHP) q21d       CANCER STAGING:  Cancer Staging  Diffuse large B cell lymphoma ([place]) Staging form: Hodgkin and Non-Hodgkin Lymphoma, AJCC 8th Edition - Clinical stage from 07/27/2022: Stage III (Diffuse large B-cell lymphoma) - Signed by Derek Jack, MD on 07/27/2022   INTERVAL HISTORY:  Debbie Hall 62 y.o. female seen for follow-up and toxicity assessment prior to cycle 4 of chemotherapy.  Reports energy levels 25%.  Has some residual cough from recent hospitalization for COVID infection from 12/27-12/29.  She has completed antibiotics.  REVIEW OF SYSTEMS:  Review of Systems  Respiratory:  Positive for cough and shortness of breath.   Psychiatric/Behavioral:  Positive for sleep disturbance. The patient is nervous/anxious.   All other systems reviewed and are negative.    PAST MEDICAL/SURGICAL HISTORY:  Past Medical History:  Diagnosis Date   Anemia    Anxiety    Diabetes mellitus ([place])    Fatty liver    GERD (gastroesophageal  reflux disease)    Gout    Hyperlipidemia    Hypertension    Lymphoma ([place])    Migraines    Pneumonia    as a child   Port-A-Cath in place 07/28/2022   Past Surgical History:  Procedure Laterality Date   ABDOMINAL HYSTERECTOMY     partial   CESAREAN SECTION     x 2   TONSILLECTOMY       SOCIAL HISTORY:  Social History   Socioeconomic History   Marital status: Married    Spouse name: Not on file   Number of children: 4   Years of education: Not on file   Highest education level: Not on file  Occupational History   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    Comment: [employer]  Tobacco Use   Smoking status: Never   Smokeless tobacco: Never  Vaping Use   Vaping Use: Never used  Substance and Sexual Activity   Alcohol use: Yes    Comment: occasional   Drug use: Never   Sexual activity: Not on file  Other Topics Concern   Not on file  Social History Narrative   Not on file   Social Determinants of Health   Financial Resource Strain: Not on file  Food Insecurity: No Food Insecurity (09/21/2022)   Hunger Vital Sign    Worried About Running Out of Food in the Last Year: Never true    Ran Out of Food in the Last Year: Never true  Transportation Needs: No Transportation Needs (09/21/2022)   PRAPARE - Hydrologist (Medical):  No    Lack of Transportation (Non-Medical): No  Physical Activity: Not on file  Stress: Not on file  Social Connections: Not on file  Intimate Partner Violence: Not At Risk (09/21/2022)   Humiliation, Afraid, Rape, and Kick questionnaire    Fear of Current or Ex-Partner: No    Emotionally Abused: No    Physically Abused: No    Sexually Abused: No    FAMILY HISTORY:  Family History  Problem Relation Age of Onset   Hypertension Mother    Heart disease Mother    Heart attack Mother    Pancreatic cancer Mother    Prostate cancer Father    Colon cancer Neg Hx    Esophageal cancer Neg Hx    Rectal  cancer Neg Hx     CURRENT MEDICATIONS:  Outpatient Encounter Medications as of 10/10/2022  Medication Sig Note   allopurinol (ZYLOPRIM) 300 MG tablet Take 1 tablet (300 mg total) by mouth daily.    ALPRAZolam (XANAX) 1 MG tablet Take 1 mg by mouth 3 (three) times daily as needed for anxiety or sleep.    CYCLOPHOSPHAMIDE IV Inject into the vein every 21 ( twenty-one) days.    DOXORUBICIN HCL IV Inject into the vein every 21 ( twenty-one) days.    escitalopram (LEXAPRO) 5 MG tablet Take 1 tablet by mouth daily. (Patient not taking: Reported on 09/21/2022)    guaiFENesin-dextromethorphan (ROBITUSSIN DM) 100-10 MG/5ML syrup Take 5 mLs by mouth every 4 (four) hours as needed for cough.    ibuprofen (ADVIL) 200 MG tablet Take 600 mg by mouth every 6 (six) hours as needed for moderate pain.    Insulin Glargine w/ Trans Port 100 UNIT/ML SOPN Inject 10 Units into the skin daily.    Insulin Lispro (HUMALOG KWIKPEN [place]) Inject 2-10 Units into the skin daily as needed (blood sugar). Sliding scale 08/30/2022: Only PRN had to use because pt was on Prednisone.   levothyroxine (SYNTHROID) 50 MCG tablet Take 50 mcg by mouth daily.    lidocaine (XYLOCAINE) 2 % solution Mix 10 ml with 10 ml of Carafate an swish and swallow 4 times daily    lidocaine-prilocaine (EMLA) cream Apply 1 Application topically as needed (Local anesthesia for port). (Patient not taking: Reported on 09/21/2022)    morphine 10 MG/5ML solution Take 2.5 mLs (5 mg total) by mouth every 2 (two) hours as needed for severe pain. Dilute in 1tsp of water and swish, then spit (Patient not taking: Reported on 09/21/2022) 09/21/2022: Have not picked up medication   Omega-3 Fatty Acids (FISH OIL) 1000 MG CAPS Take by mouth.    ondansetron (ZOFRAN) 4 MG tablet Take 1 tablet (4 mg total) by mouth every 8 (eight) hours as needed for nausea or vomiting.    pantoprazole (PROTONIX) 40 MG tablet Take 1 tablet (40 mg total) by mouth daily.    Polatuzumab  Vedotin-piiq (POLIVY IV) Inject into the vein every 21 ( twenty-one) days.    potassium chloride SA (KLOR-CON M) 20 MEQ tablet Take 1 tablet (20 mEq total) by mouth daily for 5 days.    predniSONE (DELTASONE) 20 MG tablet Take 100 mg (5 tablets) by mouth daily with food on days 1-5 of chemotherapy    promethazine (PHENERGAN) 25 MG tablet Take 1 tablet (25 mg total) by mouth every 6 (six) hours as needed for nausea or vomiting.    RITUXIMAB IV Inject into the vein every 21 ( twenty-one) days.    sucralfate (CARAFATE) 1  GM/10ML suspension Mix 10 ml with 10 ml of lidocaine and swish and swallow 4 times daily    VITAMIN E PO Take 1 capsule by mouth daily.    [EXPIRED] sodium chloride flush (NS) 0.9 % injection 10 mL     No facility-administered encounter medications on file as of 10/10/2022.    ALLERGIES:  Allergies  Allergen Reactions   Avocado Itching   Banana Itching   Latex Itching    Eyes watering, itching.   Lisinopril     angioedema   Penicillins     Facial swelling   Polivy [Polatuzumab Vedotin] Other (See Comments)    Dry throat and trouble catching her breath.   Rituxan [Rituximab]     Itching in her throat    Vancomycin Itching     PHYSICAL EXAM:  ECOG Performance status: 0  There were no vitals filed for this visit.  There were no vitals filed for this visit.   Physical Exam Vitals reviewed.  Constitutional:      Appearance: Normal appearance.  Cardiovascular:     Rate and Rhythm: Normal rate and regular rhythm.  Pulmonary:     Breath sounds: Normal breath sounds.  Abdominal:     Palpations: Abdomen is soft. There is no mass.  Neurological:     Mental Status: She is alert.  Psychiatric:        Mood and Affect: Mood normal.        Behavior: Behavior normal.   Right groin lymphadenopathy palpable.  LABORATORY DATA:  I have reviewed the labs as listed.  CBC    Component Value Date/Time   WBC 3.3 (L) 09/22/2022 1445   RBC 3.12 (L) 09/22/2022 1445    HGB 9.0 (L) 09/22/2022 1445   HCT 27.6 (L) 09/22/2022 1445   PLT 38 (L) 09/22/2022 1445   MCV 88.5 09/22/2022 1445   MCH 28.8 09/22/2022 1445   MCHC 32.6 09/22/2022 1445   RDW 13.1 09/22/2022 1445   LYMPHSABS 0.2 (L) 09/21/2022 0914   MONOABS 0.0 (L) 09/21/2022 0914   EOSABS 0.1 09/21/2022 0914   BASOSABS 0.0 09/21/2022 0914      Latest Ref Rng & Units 09/23/2022    5:35 AM 09/21/2022    9:14 AM 09/12/2022    8:35 AM  CMP  Glucose 70 - 99 mg/dL 131  194  156   BUN 8 - 23 mg/dL '10  12  14   '$ Creatinine 0.44 - 1.00 mg/dL 0.68  0.84  0.69   Sodium 135 - 145 mmol/L 139  137  141   Potassium 3.5 - 5.1 mmol/L 3.4  4.0  4.4   Chloride 98 - 111 mmol/L 109  105  109   CO2 22 - 32 mmol/L '23  22  23   '$ Calcium 8.9 - 10.3 mg/dL 8.3  9.1  8.8   Total Protein 6.5 - 8.1 g/dL  6.6  6.4   Total Bilirubin 0.3 - 1.2 mg/dL  0.7  0.4   Alkaline Phos 38 - 126 U/L  57  65   AST 15 - 41 U/L  41  74   ALT 0 - 44 U/L  63  66     DIAGNOSTIC IMAGING:  I have independently reviewed the scans and discussed with the patient.  ASSESSMENT:  1.  Stage III DLBCL: - CT soft tissue neck (06/06/2022): Left supraclavicular mass 1.9 x 1.6 cm central hypodensity.  Smaller adjacent nodule present.  Oval-shaped mass anterior to the left  IJ vein 2.9 x 1.8 cm.  No definite 2 exophytic oropharyngeal or hypopharyngeal mass noted.  Thyroid intact.  Lung bases clear. - Left supraclavicular lymph node biopsy by Dr. Ladona Horns on 06/10/2022 - Pathology: Atypical B-cell lymphoid infiltrate highly concerning for a lymphoproliferative disorder, possibly high-grade in the background of extensive granulomatous type inflammation and necrosis. - 2D echo (06/28/2022): LVEF 60-65%. - PET scan (07/07/2022): Markedly hypermetabolic adenopathy in the neck, chest, pelvis and inguinal regions.  Few subcentimeter pulmonary nodules are too small for PET resolution.  Hepatic steatosis. - Right inguinal lymph node biopsy (07/20/2022): Large B-cell  lymphoma with Ki-67 70%.  FISH studies for high risk lymphoma sent. - IPI score-3 (age more than 76, stage III, elevated LDH) - 2D echo (06/28/2022): EF 60 to 65%. - Cycle 1 Pola-R-CHP on 08/01/2022   2.  Social/family history: - She lives at home with her husband.  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  Non-smoker.  No history of exposure to chemicals or pesticides. - Mother died of pancreatic cancer.  Father had prostate cancer.  Sister had breast cancer at age 78.  Brother had kidney cancer at age 25.    PLAN:  1.  Stage III DLBCL: - Cycle 3 chemotherapy on 09/12/2022. - Admitted to the hospital from 09/21/2022 through 09/23/2022 with fever and COVID-19 infection and bronchitis.  Also found to be neutropenic and thrombocytopenic. - PET scan (10/06/2022): Interval resolution of previously demonstrated cervical, thoracic and pelvic lymphadenopathy consistent with [place] 2 response.  No residual hypermetabolic lymph nodes seen.  Left upper lobe and right middle lung airspace opacities likely from recent COVID. - Labs today shows mildly elevated AST.  CBC was grossly normal. - Recommend decreasing cycle 4 chemotherapy doses by 20%. - RTC 3 weeks for follow-up with labs.   2.  TLS prophylaxis: - She may discontinue allopurinol after completing the bottle.  3.  Acid reflux: - Continue Protonix 40 mg daily.  4.  Nausea: - Continue Zofran 8 mg every 8 hours as needed.  5.  Mouth ulcers: - Mostly under the upper lip.  Use lidocaine and Carafate as needed.    Orders placed this encounter:  No orders of the defined types were placed in this encounter.       Derek Jack, MD [place] [phone]

## 2022-10-10 NOTE — Progress Notes (Signed)
Discontinue diphenhydramine from oncology treatment plan --> Add Quzyttir (cetirizine) 10 mg IVPush x 1 as premedication for oncology treatment plan.  T.O. Dr Katragadda/Oluwatoyin Banales, PharmD  

## 2022-10-10 NOTE — Progress Notes (Signed)
Patient presents today for chemotherapy infusion.  Patient is in satisfactory condition with no new complaints voiced.  Vital signs are stable.  Labs reviewed by Dr. Delton Coombes during her office visit.  All labs are within treatment parameters.  We will proceed with treatment per MD orders.  Patient tolerated treatment well with no complaints voiced.  Neulasta OnPro applied to R arm with no issues.  Patient left ambulatory with daughter in stable condition.  Vital signs stable at discharge.  Follow up as scheduled.

## 2022-10-10 NOTE — Patient Instructions (Signed)
[facility]  Discharge Instructions: Thank you for choosing [facility] to provide your oncology and hematology care.  If you have a lab appointment with the [facility], please come in thru the Main Entrance and check in at the main information desk.  Wear comfortable clothing and clothing appropriate for easy access to any Portacath or PICC line.   We strive to give you quality time with your provider. You may need to reschedule your appointment if you arrive late (15 or more minutes).  Arriving late affects you and other patients whose appointments are after yours.  Also, if you miss three or more appointments without notifying the office, you may be dismissed from the clinic at the provider's discretion.      For prescription refill requests, have your pharmacy contact our office and allow 72 hours for refills to be completed.    Today you received the following chemotherapy and/or immunotherapy agents Rituxan/Polivy/Cytoxan/Adriamycin/Neulasta.  Rituximab Injection What is this medication? RITUXIMAB (ri TUX i mab) treats leukemia and lymphoma. It works by blocking a protein that causes cancer cells to grow and multiply. This helps to slow or stop the spread of cancer cells. It may also be used to treat autoimmune conditions, such as arthritis. It works by slowing down an overactive immune system. It is a monoclonal antibody. This medicine may be used for other purposes; ask your health care provider or pharmacist if you have questions. COMMON BRAND NAME(S): RIABNI, Rituxan, RUXIENCE, truxima What should I tell my care team before I take this medication? They need to know if you have any of these conditions: Chest pain Heart disease Immune system problems Infection, such as chickenpox, cold sores, hepatitis B, herpes Irregular heartbeat or rhythm Kidney disease Low blood counts, such as low white cells, platelets, red cells Lung disease Recent or  upcoming vaccine An unusual or allergic reaction to rituximab, other medications, foods, dyes, or preservatives Pregnant or trying to get pregnant Breast-feeding How should I use this medication? This medication is injected into a vein. It is given by a care team in a hospital or clinic setting. A special MedGuide will be given to you before each treatment. Be sure to read this information carefully each time. Talk to your care team about the use of this medication in children. While this medication may be prescribed for children as young as 6 months for selected conditions, precautions do apply. Overdosage: If you think you have taken too much of this medicine contact a poison control center or emergency room at once. NOTE: This medicine is only for you. Do not share this medicine with others. What if I miss a dose? Keep appointments for follow-up doses. It is important not to miss your dose. Call your care team if you are unable to keep an appointment. What may interact with this medication? Do not take this medication with any of the following: Live vaccines This medication may also interact with the following: Cisplatin This list may not describe all possible interactions. Give your health care provider a list of all the medicines, herbs, non-prescription drugs, or dietary supplements you use. Also tell them if you smoke, drink alcohol, or use illegal drugs. Some items may interact with your medicine. What should I watch for while using this medication? Your condition will be monitored carefully while you are receiving this medication. You may need blood work while taking this medication. This medication can cause serious infusion reactions. To reduce the  risk your care team may give you other medications to take before receiving this one. Be sure to follow the directions from your care team. This medication may increase your risk of getting an infection. Call your care team for advice if  you get a fever, chills, sore throat, or other symptoms of a cold or flu. Do not treat yourself. Try to avoid being around people who are sick. Call your care team if you are around anyone with measles, chickenpox, or if you develop sores or blisters that do not heal properly. Avoid taking medications that contain aspirin, acetaminophen, ibuprofen, naproxen, or ketoprofen unless instructed by your care team. These medications may hide a fever. This medication may cause serious skin reactions. They can happen weeks to months after starting the medication. Contact your care team right away if you notice fevers or flu-like symptoms with a rash. The rash may be red or purple and then turn into blisters or peeling of the skin. You may also notice a red rash with swelling of the face, lips, or lymph nodes in your neck or under your arms. In some patients, this medication may cause a serious brain infection that may cause death. If you have any problems seeing, thinking, speaking, walking, or standing, tell your care team right away. If you cannot reach your care team, urgently seek another source of medical care. Talk to your care team if you may be pregnant. Serious birth defects can occur if you take this medication during pregnancy and for 12 months after the last dose. You will need a negative pregnancy test before starting this medication. Contraception is recommended while taking this medication and for 12 months after the last dose. Your care team can help you find the option that works for you. Do not breastfeed while taking this medication and for at least 6 months after the last dose. What side effects may I notice from receiving this medication? Side effects that you should report to your care team as soon as possible: Allergic reactions or angioedema--skin rash, itching or hives, swelling of the face, eyes, lips, tongue, arms, or legs, trouble swallowing or breathing Bowel blockage--stomach cramping,  unable to have a bowel movement or pass gas, loss of appetite, vomiting Dizziness, loss of balance or coordination, confusion or trouble speaking Heart attack--pain or tightness in the chest, shoulders, arms, or jaw, nausea, shortness of breath, cold or clammy skin, feeling faint or lightheaded Heart rhythm changes--fast or irregular heartbeat, dizziness, feeling faint or lightheaded, chest pain, trouble breathing Infection--fever, chills, cough, sore throat, wounds that don't heal, pain or trouble when passing urine, general feeling of discomfort or being unwell Infusion reactions--chest pain, shortness of breath or trouble breathing, feeling faint or lightheaded Kidney injury--decrease in the amount of urine, swelling of the ankles, hands, or feet Liver injury--right upper belly pain, loss of appetite, nausea, light-colored stool, dark yellow or Debbie Hall urine, yellowing skin or eyes, unusual weakness or fatigue Redness, blistering, peeling, or loosening of the skin, including inside the mouth Stomach pain that is severe, does not go away, or gets worse Tumor lysis syndrome (TLS)--nausea, vomiting, diarrhea, decrease in the amount of urine, dark urine, unusual weakness or fatigue, confusion, muscle pain or cramps, fast or irregular heartbeat, joint pain Side effects that usually do not require medical attention (report to your care team if they continue or are bothersome): Headache Joint pain Nausea Runny or stuffy nose Unusual weakness or fatigue This list may not describe all possible side  effects. Call your doctor for medical advice about side effects. You may report side effects to FDA at 1-800-FDA-1088. Where should I keep my medication? This medication is given in a hospital or clinic. It will not be stored at home. NOTE: This sheet is a summary. It may not cover all possible information. If you have questions about this medicine, talk to your doctor, pharmacist, or health care provider.   2023 Elsevier/Gold Standard (2022-01-25 00:00:00)    Polatuzumab Vedotin Injection What is this medication? POLATUZUMAB VEDOTIN (poe la tooz ue mab ve doe tin) treats lymphoma. It works by blocking a protein that causes cancer cells to grow and multiply. This helps to slow or stop the spread of cancer cells. This medicine may be used for other purposes; ask your health care provider or pharmacist if you have questions. COMMON BRAND NAME(S): Denyse Dago What should I tell my care team before I take this medication? They need to know if you have any of these conditions: Infection, such as chickenpox, cold sores, herpes Liver disease Low blood cell levels, such as white cells, red cells, platelets Tingling of the fingers or toes or other nerve disorder An unusual or allergic reaction to polatuzumab vedotin, other medications, foods, dyes, or preservatives If you or your partner are pregnant or trying to get pregnant Breast-feeding How should I use this medication? This medication is infused into a vein. It is given by your care team in a hospital or clinic setting. Talk to your care team about the use of this medication in children. Special care may be needed. Overdosage: If you think you have taken too much of this medicine contact a poison control center or emergency room at once. NOTE: This medicine is only for you. Do not share this medicine with others. What if I miss a dose? Keep appointments for follow-up doses. It is important not to miss your dose. Call your care team if you are unable to keep an appointment. What may interact with this medication? Certain antibiotics, such as clarithromycin, telithromycin Certain antivirals for HIV or AIDS Certain medications for fungal infections, such as ketoconazole, itraconazole, posaconazole, voriconazole Certain medications for seizures, such as carbamazepine, phenobarbital, phenytoin This list may not describe all possible interactions. Give  your health care provider a list of all the medicines, herbs, non-prescription drugs, or dietary supplements you use. Also tell them if you smoke, drink alcohol, or use illegal drugs. Some items may interact with your medicine. What should I watch for while using this medication? Your condition will be monitored carefully while you are receiving this medication. This medication may make you feel generally unwell. This is not uncommon as chemotherapy can affect healthy cells as well as cancer cells. Report any side effects. Continue your course of treatment even though you feel ill unless your care team tells you to stop. You may need blood work while taking this medication. This medication may increase your risk of getting an infection. Call your care team for advice if you get a fever, chills, sore throat, or other symptoms of a cold or flu. Do not treat yourself. Try to avoid being around people who are sick. This medication may increase your risk to bruise or bleed. Call your care team if you notice any unusual bleeding. In some patients, this medication may cause a serious brain infection that may cause death. If you have any problems seeing, thinking, speaking, walking, or standing, tell your care team right away. If you cannot reach your  care team, urgently seek other source of medical care. Talk to your care team if you wish to become pregnant or think you might be pregnant. This medication can cause serious birth defects if taken during pregnancy or for 3 months after the last dose.  A negative pregnancy test is required before starting this medication. A reliable form of contraception is recommended while taking this medication and for 3 months after the last dose. Talk to your care team about effective forms of contraception. Do not father a child while taking this medication or for 5 months after the last dose. Use a condom while having sex during this time period. Do not breastfeed while taking  this medication and for 2 months after the last dose. This medication may cause infertility. Talk to your care team if you are concerned about your fertility. What side effects may I notice from receiving this medication? Side effects that you should report to your care team as soon as possible: Allergic reactions--skin rash, itching, hives, swelling of the face, lips, tongue, or throat Dizziness, loss of balance or coordination, confusion or trouble speaking Infection--fever, chills, cough, sore throat, wounds that don't heal, pain or trouble when passing urine, general feeling of discomfort or being unwell Infusion reactions--chest pain, shortness of breath or trouble breathing, feeling faint or lightheaded Liver injury--right upper belly pain, loss of appetite, nausea, light-colored stool, dark yellow or Debbie Hall urine, yellowing skin or eyes, unusual weakness or fatigue Low red blood cell level--unusual weakness or fatigue, dizziness, headache, trouble breathing Pain, tingling, or numbness in the hands or feet Stomach pain, unusual weakness or fatigue, nausea, vomiting, diarrhea, or fever that lasts longer than expected Tumor lysis syndrome (TLS)--nausea, vomiting, diarrhea, decrease in the amount of urine, dark urine, unusual weakness or fatigue, confusion, muscle pain or cramps, fast or irregular heartbeat, joint pain Unusual bruising or bleeding Side effects that usually do not require medical attention (report these to your care team if they continue or are bothersome): Diarrhea Dizziness Loss of appetite Vomiting Weight loss This list may not describe all possible side effects. Call your doctor for medical advice about side effects. You may report side effects to FDA at 1-800-FDA-1088. Where should I keep my medication? This medication is given in a hospital or clinic. It will not be stored at home. NOTE: This sheet is a summary. It may not cover all possible information. If you have  questions about this medicine, talk to your doctor, pharmacist, or health care provider.  2023 Elsevier/Gold Standard (2022-01-19 00:00:00)    Cyclophosphamide Injection What is this medication? CYCLOPHOSPHAMIDE (sye kloe FOSS fa mide) treats some types of cancer. It works by slowing down the growth of cancer cells. This medicine may be used for other purposes; ask your health care provider or pharmacist if you have questions. COMMON BRAND NAME(S): Cyclophosphamide, Cytoxan, Neosar What should I tell my care team before I take this medication? They need to know if you have any of these conditions: Heart disease Irregular heartbeat or rhythm Infection Kidney problems Liver disease Low blood cell levels (white cells, platelets, or red blood cells) Lung disease Previous radiation Trouble passing urine An unusual or allergic reaction to cyclophosphamide, other medications, foods, dyes, or preservatives Pregnant or trying to get pregnant Breast-feeding How should I use this medication? This medication is injected into a vein. It is given by your care team in a hospital or clinic setting. Talk to your care team about the use of this medication in  children. Special care may be needed. Overdosage: If you think you have taken too much of this medicine contact a poison control center or emergency room at once. NOTE: This medicine is only for you. Do not share this medicine with others. What if I miss a dose? Keep appointments for follow-up doses. It is important not to miss your dose. Call your care team if you are unable to keep an appointment. What may interact with this medication? Amphotericin B Amiodarone Azathioprine Certain antivirals for HIV or hepatitis Certain medications for blood pressure, such as enalapril, lisinopril, quinapril Cyclosporine Diuretics Etanercept Indomethacin Medications that relax muscles Metronidazole Natalizumab Tamoxifen Warfarin This list may not  describe all possible interactions. Give your health care provider a list of all the medicines, herbs, non-prescription drugs, or dietary supplements you use. Also tell them if you smoke, drink alcohol, or use illegal drugs. Some items may interact with your medicine. What should I watch for while using this medication? This medication may make you feel generally unwell. This is not uncommon as chemotherapy can affect healthy cells as well as cancer cells. Report any side effects. Continue your course of treatment even though you feel ill unless your care team tells you to stop. You may need blood work while you are taking this medication. This medication may increase your risk of getting an infection. Call your care team for advice if you get a fever, chills, sore throat, or other symptoms of a cold or flu. Do not treat yourself. Try to avoid being around people who are sick. Avoid taking medications that contain aspirin, acetaminophen, ibuprofen, naproxen, or ketoprofen unless instructed by your care team. These medications may hide a fever. Be careful brushing or flossing your teeth or using a toothpick because you may get an infection or bleed more easily. If you have any dental work done, tell your dentist you are receiving this medication. Drink water or other fluids as directed. Urinate often, even at night. Some products may contain alcohol. Ask your care team if this medication contains alcohol. Be sure to tell all care teams you are taking this medicine. Certain medicines, like metronidazole and disulfiram, can cause an unpleasant reaction when taken with alcohol. The reaction includes flushing, headache, nausea, vomiting, sweating, and increased thirst. The reaction can last from 30 minutes to several hours. Talk to your care team if you wish to become pregnant or think you might be pregnant. This medication can cause serious birth defects if taken during pregnancy and for 1 year after the last  dose. A negative pregnancy test is required before starting this medication. A reliable form of contraception is recommended while taking this medication and for 1 year after the last dose. Talk to your care team about reliable forms of contraception. Do not father a child while taking this medication and for 4 months after the last dose. Use a condom during this time period. Do not breast-feed while taking this medication or for 1 week after the last dose. This medication may cause infertility. Talk to your care team if you are concerned about your fertility. Talk to your care team about your risk of cancer. You may be more at risk for certain types of cancer if you take this medication. What side effects may I notice from receiving this medication? Side effects that you should report to your care team as soon as possible: Allergic reactions--skin rash, itching, hives, swelling of the face, lips, tongue, or throat Dry cough, shortness of  breath or trouble breathing Heart failure--shortness of breath, swelling of the ankles, feet, or hands, sudden weight gain, unusual weakness or fatigue Heart muscle inflammation--unusual weakness or fatigue, shortness of breath, chest pain, fast or irregular heartbeat, dizziness, swelling of the ankles, feet, or hands Heart rhythm changes--fast or irregular heartbeat, dizziness, feeling faint or lightheaded, chest pain, trouble breathing Infection--fever, chills, cough, sore throat, wounds that don't heal, pain or trouble when passing urine, general feeling of discomfort or being unwell Kidney injury--decrease in the amount of urine, swelling of the ankles, hands, or feet Liver injury--right upper belly pain, loss of appetite, nausea, light-colored stool, dark yellow or Debbie Hall urine, yellowing skin or eyes, unusual weakness or fatigue Low red blood cell level--unusual weakness or fatigue, dizziness, headache, trouble breathing Low sodium level--muscle weakness,  fatigue, dizziness, headache, confusion Red or dark Debbie Hall urine Unusual bruising or bleeding Side effects that usually do not require medical attention (report to your care team if they continue or are bothersome): Hair loss Irregular menstrual cycles or spotting Loss of appetite Nausea Pain, redness, or swelling with sores inside the mouth or throat Vomiting This list may not describe all possible side effects. Call your doctor for medical advice about side effects. You may report side effects to FDA at 1-800-FDA-1088. Where should I keep my medication? This medication is given in a hospital or clinic. It will not be stored at home. NOTE: This sheet is a summary. It may not cover all possible information. If you have questions about this medicine, talk to your doctor, pharmacist, or health care provider.  2023 Elsevier/Gold Standard (2021-11-02 00:00:00)    Doxorubicin Injection What is this medication? DOXORUBICIN (dox oh ROO bi sin) treats some types of cancer. It works by slowing down the growth of cancer cells. This medicine may be used for other purposes; ask your health care provider or pharmacist if you have questions. COMMON BRAND NAME(S): Adriamycin, Adriamycin PFS, Adriamycin RDF, Rubex What should I tell my care team before I take this medication? They need to know if you have any of these conditions: Heart disease History of low blood cell levels caused by a medication Liver disease Recent or ongoing radiation An unusual or allergic reaction to doxorubicin, other medications, foods, dyes, or preservatives If you or your partner are pregnant or trying to get pregnant Breast-feeding How should I use this medication? This medication is injected into a vein. It is given by your care team in a hospital or clinic setting. Talk to your care team about the use of this medication in children. Special care may be needed. Overdosage: If you think you have taken too much of this  medicine contact a poison control center or emergency room at once. NOTE: This medicine is only for you. Do not share this medicine with others. What if I miss a dose? Keep appointments for follow-up doses. It is important not to miss your dose. Call your care team if you are unable to keep an appointment. What may interact with this medication? 6-mercaptopurine Paclitaxel Phenytoin St. John's wort Trastuzumab Verapamil This list may not describe all possible interactions. Give your health care provider a list of all the medicines, herbs, non-prescription drugs, or dietary supplements you use. Also tell them if you smoke, drink alcohol, or use illegal drugs. Some items may interact with your medicine. What should I watch for while using this medication? Your condition will be monitored carefully while you are receiving this medication. You may need blood work  while taking this medication. This medication may make you feel generally unwell. This is not uncommon as chemotherapy can affect healthy cells as well as cancer cells. Report any side effects. Continue your course of treatment even though you feel ill unless your care team tells you to stop. There is a maximum amount of this medication you should receive throughout your life. The amount depends on the medical condition being treated and your overall health. Your care team will watch how much of this medication you receive. Tell your care team if you have taken this medication before. Your urine may turn red for a few days after your dose. This is not blood. If your urine is dark or Debbie Hall, call your care team. In some cases, you may be given additional medications to help with side effects. Follow all directions for their use. This medication may increase your risk of getting an infection. Call your care team for advice if you get a fever, chills, sore throat, or other symptoms of a cold or flu. Do not treat yourself. Try to avoid being around  people who are sick. This medication may increase your risk to bruise or bleed. Call your care team if you notice any unusual bleeding. Talk to your care team about your risk of cancer. You may be more at risk for certain types of cancers if you take this medication. You should make sure that you get enough Coenzyme Q10 while you are taking this medication. Discuss the foods you eat and the vitamins you take with your care team. Talk to your care team if you or your partner may be pregnant. Serious birth defects can occur if you take this medication during pregnancy and for 6 months after the last dose. Contraception is recommended while taking this medication and for 6 months after the last dose. Your care team can help you find the option that works for you. If your partner can get pregnant, use a condom while taking this medication and for 6 months after the last dose. Do not breastfeed while taking this medication. This medication may cause infertility. Talk to your care team if you are concerned about your fertility. What side effects may I notice from receiving this medication? Side effects that you should report to your care team as soon as possible: Allergic reactions--skin rash, itching, hives, swelling of the face, lips, tongue, or throat Heart failure--shortness of breath, swelling of the ankles, feet, or hands, sudden weight gain, unusual weakness or fatigue Heart rhythm changes--fast or irregular heartbeat, dizziness, feeling faint or lightheaded, chest pain, trouble breathing Infection--fever, chills, cough, sore throat, wounds that don't heal, pain or trouble when passing urine, general feeling of discomfort or being unwell Low red blood cell level--unusual weakness or fatigue, dizziness, headache, trouble breathing Painful swelling, warmth, or redness of the skin, blisters or sores at the infusion site Unusual bruising or bleeding Side effects that usually do not require medical  attention (report to your care team if they continue or are bothersome): Diarrhea Hair loss Nausea Pain, redness, or swelling with sores inside the mouth or throat Red urine This list may not describe all possible side effects. Call your doctor for medical advice about side effects. You may report side effects to FDA at 1-800-FDA-1088. Where should I keep my medication? This medication is given in a hospital or clinic. It will not be stored at home. NOTE: This sheet is a summary. It may not cover all possible information. If you have  questions about this medicine, talk to your doctor, pharmacist, or health care provider.  2023 Elsevier/Gold Standard (2022-01-19 00:00:00)    Pegfilgrastim Injection What is this medication? PEGFILGRASTIM (PEG fil gra stim) lowers the risk of infection in people who are receiving chemotherapy. It works by Building control surveyor make more white blood cells, which protects your body from infection. It may also be used to help people who have been exposed to high doses of radiation. This medicine may be used for other purposes; ask your health care provider or pharmacist if you have questions. COMMON BRAND NAME(S): [employer], Neulasta, Nyvepria, Stimufend, UDENYCA, Ziextenzo What should I tell my care team before I take this medication? They need to know if you have any of these conditions: Kidney disease Latex allergy Ongoing radiation therapy Sickle cell disease Skin reactions to acrylic adhesives (On-Body Injector only) An unusual or allergic reaction to pegfilgrastim, filgrastim, other medications, foods, dyes, or preservatives Pregnant or trying to get pregnant Breast-feeding How should I use this medication? This medication is for injection under the skin. If you get this medication at home, you will be taught how to prepare and give the pre-filled syringe or how to use the On-body Injector. Refer to the patient Instructions for Use for detailed  instructions. Use exactly as directed. Tell your care team immediately if you suspect that the On-body Injector may not have performed as intended or if you suspect the use of the On-body Injector resulted in a missed or partial dose. It is important that you put your used needles and syringes in a special sharps container. Do not put them in a trash can. If you do not have a sharps container, call your pharmacist or care team to get one. Talk to your care team about the use of this medication in children. While this medication may be prescribed for selected conditions, precautions do apply. Overdosage: If you think you have taken too much of this medicine contact a poison control center or emergency room at once. NOTE: This medicine is only for you. Do not share this medicine with others. What if I miss a dose? It is important not to miss your dose. Call your care team if you miss your dose. If you miss a dose due to an On-body Injector failure or leakage, a new dose should be administered as soon as possible using a single prefilled syringe for manual use. What may interact with this medication? Interactions have not been studied. This list may not describe all possible interactions. Give your health care provider a list of all the medicines, herbs, non-prescription drugs, or dietary supplements you use. Also tell them if you smoke, drink alcohol, or use illegal drugs. Some items may interact with your medicine. What should I watch for while using this medication? Your condition will be monitored carefully while you are receiving this medication. You may need blood work done while you are taking this medication. Talk to your care team about your risk of cancer. You may be more at risk for certain types of cancer if you take this medication. If you are going to need a MRI, CT scan, or other procedure, tell your care team that you are using this medication (On-Body Injector only). What side effects  may I notice from receiving this medication? Side effects that you should report to your care team as soon as possible: Allergic reactions--skin rash, itching, hives, swelling of the face, lips, tongue, or throat Capillary leak syndrome--stomach or muscle  pain, unusual weakness or fatigue, feeling faint or lightheaded, decrease in the amount of urine, swelling of the ankles, hands, or feet, trouble breathing High white blood cell level--fever, fatigue, trouble breathing, night sweats, change in vision, weight loss Inflammation of the aorta--fever, fatigue, back, chest, or stomach pain, severe headache Kidney injury (glomerulonephritis)--decrease in the amount of urine, red or dark Debbie Hall urine, foamy or bubbly urine, swelling of the ankles, hands, or feet Shortness of breath or trouble breathing Spleen injury--pain in upper left stomach or shoulder Unusual bruising or bleeding Side effects that usually do not require medical attention (report to your care team if they continue or are bothersome): Bone pain Pain in the hands or feet This list may not describe all possible side effects. Call your doctor for medical advice about side effects. You may report side effects to FDA at 1-800-FDA-1088. Where should I keep my medication? Keep out of the reach of children. If you are using this medication at home, you will be instructed on how to store it. Throw away any unused medication after the expiration date on the label. NOTE: This sheet is a summary. It may not cover all possible information. If you have questions about this medicine, talk to your doctor, pharmacist, or health care provider.  2023 Elsevier/Gold Standard (2021-04-01 00:00:00)        To help prevent nausea and vomiting after your treatment, we encourage you to take your nausea medication as directed.  BELOW ARE SYMPTOMS THAT SHOULD BE REPORTED IMMEDIATELY: *FEVER GREATER THAN 100.4 F (38 C) OR HIGHER *CHILLS OR SWEATING *NAUSEA  AND VOMITING THAT IS NOT CONTROLLED WITH YOUR NAUSEA MEDICATION *UNUSUAL SHORTNESS OF BREATH *UNUSUAL BRUISING OR BLEEDING *URINARY PROBLEMS (pain or burning when urinating, or frequent urination) *BOWEL PROBLEMS (unusual diarrhea, constipation, pain near the anus) TENDERNESS IN MOUTH AND THROAT WITH OR WITHOUT PRESENCE OF ULCERS (sore throat, sores in mouth, or a toothache) UNUSUAL RASH, SWELLING OR PAIN  UNUSUAL VAGINAL DISCHARGE OR ITCHING   Items with * indicate a potential emergency and should be followed up as soon as possible or go to the Emergency Department if any problems should occur.  Please show the CHEMOTHERAPY ALERT CARD or IMMUNOTHERAPY ALERT CARD at check-in to the Emergency Department and triage nurse.  Should you have questions after your visit or need to cancel or reschedule your appointment, please contact [facility] [phone]  and follow the prompts.  Office hours are 8:00 a.m. to 4:30 p.m. Monday - Friday. Please note that voicemails left after 4:00 p.m. may not be returned until the following business day.  We are closed weekends and major holidays. You have access to a nurse at all times for urgent questions. Please call the main number to the clinic [phone] and follow the prompts.  For any non-urgent questions, you may also contact your provider using MyChart. We now offer e-Visits for anyone 37 and older to request care online for non-urgent symptoms. For details visit [email or web address].   Also download the MyChart app! Go to the app store, search "MyChart", open the app, select [facility], and log in with your MyChart username and password.

## 2022-10-13 ENCOUNTER — Encounter: Payer: Self-pay | Admitting: Hematology

## 2022-10-18 ENCOUNTER — Telehealth (HOSPITAL_COMMUNITY): Payer: Self-pay | Admitting: *Deleted

## 2022-10-18 ENCOUNTER — Emergency Department (HOSPITAL_COMMUNITY)
Admission: EM | Admit: 2022-10-18 | Discharge: 2022-10-18 | Disposition: A | Discharge status: Home / Self Care | Payer: Managed Care, Other (non HMO) | Attending: Emergency Medicine | Admitting: Emergency Medicine

## 2022-10-18 ENCOUNTER — Emergency Department (HOSPITAL_COMMUNITY): Payer: Managed Care, Other (non HMO)

## 2022-10-18 ENCOUNTER — Encounter: Payer: Self-pay | Admitting: Hematology

## 2022-10-18 DIAGNOSIS — C9000 Multiple myeloma not having achieved remission: Secondary | ICD-10-CM | POA: Diagnosis not present

## 2022-10-18 DIAGNOSIS — N390 Urinary tract infection, site not specified: Secondary | ICD-10-CM | POA: Insufficient documentation

## 2022-10-18 DIAGNOSIS — Z794 Long term (current) use of insulin: Secondary | ICD-10-CM | POA: Insufficient documentation

## 2022-10-18 DIAGNOSIS — I10 Essential (primary) hypertension: Secondary | ICD-10-CM | POA: Insufficient documentation

## 2022-10-18 DIAGNOSIS — Z91040 Latex allergy status: Secondary | ICD-10-CM | POA: Diagnosis not present

## 2022-10-18 DIAGNOSIS — Z79899 Other long term (current) drug therapy: Secondary | ICD-10-CM | POA: Insufficient documentation

## 2022-10-18 DIAGNOSIS — R509 Fever, unspecified: Secondary | ICD-10-CM | POA: Diagnosis present

## 2022-10-18 DIAGNOSIS — E119 Type 2 diabetes mellitus without complications: Secondary | ICD-10-CM | POA: Insufficient documentation

## 2022-10-18 LAB — CBC WITH DIFFERENTIAL/PLATELET
Abs Immature Granulocytes: 0.1 10*3/uL — ABNORMAL HIGH (ref 0.00–0.07)
Basophils Absolute: 0 10*3/uL (ref 0.0–0.1)
Basophils Relative: 1 %
Eosinophils Absolute: 0.3 10*3/uL (ref 0.0–0.5)
Eosinophils Relative: 18 %
HCT: 34.6 % — ABNORMAL LOW (ref 36.0–46.0)
Hemoglobin: 11.1 g/dL — ABNORMAL LOW (ref 12.0–15.0)
Lymphocytes Relative: 8 %
Lymphs Abs: 0.2 10*3/uL — ABNORMAL LOW (ref 0.7–4.0)
MCH: 28.2 pg (ref 26.0–34.0)
MCHC: 32.1 g/dL (ref 30.0–36.0)
MCV: 88 fL (ref 80.0–100.0)
Metamyelocytes Relative: 7 %
Monocytes Absolute: 0.2 10*3/uL (ref 0.1–1.0)
Monocytes Relative: 12 %
Neutro Abs: 1 10*3/uL — ABNORMAL LOW (ref 1.7–7.7)
Neutrophils Relative %: 54 %
Platelets: 65 10*3/uL — ABNORMAL LOW (ref 150–400)
RBC: 3.93 MIL/uL (ref 3.87–5.11)
RDW: 14.1 % (ref 11.5–15.5)
WBC: 1.9 10*3/uL — ABNORMAL LOW (ref 4.0–10.5)
nRBC: 0 % (ref 0.0–0.2)

## 2022-10-18 LAB — LACTIC ACID, PLASMA
Lactic Acid, Venous: 1.7 mmol/L (ref 0.5–1.9)
Lactic Acid, Venous: 2.6 mmol/L (ref 0.5–1.9)

## 2022-10-18 LAB — COMPREHENSIVE METABOLIC PANEL
ALT: 56 U/L — ABNORMAL HIGH (ref 0–44)
AST: 50 U/L — ABNORMAL HIGH (ref 15–41)
Albumin: 4.3 g/dL (ref 3.5–5.0)
Alkaline Phosphatase: 78 U/L (ref 38–126)
Anion gap: 15 (ref 5–15)
BUN: 9 mg/dL (ref 8–23)
CO2: 22 mmol/L (ref 22–32)
Calcium: 9.4 mg/dL (ref 8.9–10.3)
Chloride: 99 mmol/L (ref 98–111)
Creatinine, Ser: 0.77 mg/dL (ref 0.44–1.00)
GFR, Estimated: >60 mL/min (ref >60)
Glucose, Bld: 121 mg/dL — ABNORMAL HIGH (ref 70–99)
Potassium: 4.2 mmol/L (ref 3.5–5.1)
Sodium: 136 mmol/L (ref 135–145)
Total Bilirubin: 0.8 mg/dL (ref 0.3–1.2)
Total Protein: 7.3 g/dL (ref 6.5–8.1)

## 2022-10-18 LAB — URINALYSIS, ROUTINE W REFLEX MICROSCOPIC
Bilirubin Urine: NEGATIVE
Glucose, UA: NEGATIVE mg/dL
Ketones, ur: 5 mg/dL — AB
Nitrite: POSITIVE — AB
Protein, ur: 100 mg/dL — AB
Specific Gravity, Urine: 1.021 (ref 1.005–1.030)
WBC, UA: >50 WBC/hpf — ABNORMAL HIGH (ref 0–5)
pH: 5 (ref 5.0–8.0)

## 2022-10-18 LAB — PROTIME-INR
INR: 0.9 (ref 0.8–1.2)
Prothrombin Time: 12.4 seconds (ref 11.4–15.2)

## 2022-10-18 MED ORDER — SODIUM CHLORIDE 0.9 % IV BOLUS
1000.0000 mL | Freq: Once | INTRAVENOUS | Status: AC
  Administered 2022-10-18: 1000 mL via INTRAVENOUS

## 2022-10-18 MED ORDER — HEPARIN SOD (PORK) LOCK FLUSH 100 UNIT/ML IV SOLN
500.0000 [IU] | Freq: Once | INTRAVENOUS | Status: AC
  Administered 2022-10-18: 500 [IU]
  Filled 2022-10-18: qty 5

## 2022-10-18 MED ORDER — CIPROFLOXACIN IN D5W 400 MG/200ML IV SOLN: 400.0000 mg | Freq: Once | INTRAVENOUS | Status: DC

## 2022-10-18 MED ORDER — SODIUM CHLORIDE 0.9 % IV SOLN
1.0000 g | Freq: Once | INTRAVENOUS | Status: AC
  Administered 2022-10-18: 1 g via INTRAVENOUS
  Filled 2022-10-18: qty 10

## 2022-10-18 MED ORDER — CIPROFLOXACIN HCL 500 MG PO TABS: 500.0000 mg | ORAL_TABLET | Freq: Two times a day (BID) | ORAL | 0 refills | Status: DC

## 2022-10-18 NOTE — ED Provider Triage Note (Signed)
Emergency Medicine Provider Triage Evaluation Note  Debbie Hall , a 62 y.o. female with past medical history of diffuse large B-cell lymphoma, hypertension, type 2 diabetes, anemia, and febrile neutropenia was evaluated in triage.  Pt sent here under the advisement of her oncologist.  Patient reports fever since yesterday, max temp of 101 at home.  She also endorses some generalized bodyaches and malaise.  Intermittent tightness of her chest.  She is currently undergoing every 3 weeks chemotherapy treatment with last treatment on 10/10/2022.  She endorses significant leukopenia after her previous 2 chemotherapy treatments.  Her dose on the 15th was reduced by 20%.  She has not taken any fever reducing medications at home.  She denies sore throat, abdominal pain, nausea, vomiting, diarrhea, shortness of breath and chest pain.  Review of Systems  Positive: Generalized bodyaches, malaise, fever Negative: Chest pain, cough, sore throat, vomiting diarrhea  Physical Exam  BP 114/74 (BP Location: Left Arm)   Pulse (!) 111   Temp 99.5 F (37.5 C) (Oral)   Resp (!) 22   Ht '5\' 5"'$  (1.651 m)   Wt 73 kg   SpO2 99%   BMI 26.79 kg/m  Gen:   Awake, no distress   Resp:  Normal effort  MSK:   Moves extremities without difficulty  Other:  No erythema or edema of the oropharynx  Medical Decision Making  Medically screening exam initiated at 11:37 AM.  Appropriate orders placed.  Venesa Semidey was informed that the remainder of the evaluation will be completed by another provider, this initial triage assessment does not replace that evaluation, and the importance of remaining in the ED until their evaluation is complete.     Kem Parkinson, PA-C 10/18/22 1151

## 2022-10-18 NOTE — ED Provider Notes (Signed)
[facility] Provider Note   CSN: [id number] Arrival date & time: 10/18/22  1000     History {Add pertinent medical, surgical, social history, OB history to HPI:1} Chief Complaint  Patient presents with   Fever    Debbie Hall is a 62 y.o. female.  HPI Patient presents from home with fever.  She has some mild generalized discomfort, subjective fever over the past few days, symptoms improved with Tylenol, but today had slightly more feverishness, no focal abdominal pain, chest pain.  Last chemo was 8 days ago.  Patient is 4 out of 6 courses of chemotherapy in for multiple myeloma which is reportedly largely in remission.  After speaking with her oncologist she was sent here for evaluation.    Home Medications Prior to Admission medications   Medication Sig Start Date End Date Taking? Authorizing Provider  allopurinol (ZYLOPRIM) 300 MG tablet Take 1 tablet (300 mg total) by mouth daily. 08/01/22   Derek Jack, MD  ALPRAZolam Duanne Moron) 1 MG tablet Take 1 mg by mouth 3 (three) times daily as needed for anxiety or sleep. 08/03/22   [provider]  CYCLOPHOSPHAMIDE IV Inject into the vein every 21 ( twenty-one) days. 08/01/22   [provider]  DOXORUBICIN HCL IV Inject into the vein every 21 ( twenty-one) days.    [provider]  escitalopram (LEXAPRO) 5 MG tablet Take 1 tablet by mouth daily. 10/10/19   [provider]  guaiFENesin-dextromethorphan (ROBITUSSIN DM) 100-10 MG/5ML syrup Take 5 mLs by mouth every 4 (four) hours as needed for cough. 09/23/22   Pokhrel, Corrie Mckusick, MD  ibuprofen (ADVIL) 200 MG tablet Take 600 mg by mouth every 6 (six) hours as needed for moderate pain.    [provider]  Insulin Glargine w/ Trans Port 100 UNIT/ML SOPN Inject 10 Units into the skin daily. 03/22/21   [provider]  Insulin Lispro (HUMALOG KWIKPEN [place]) Inject 2-10 Units into the skin daily as  needed (blood sugar). Sliding scale    [provider]  levothyroxine (SYNTHROID) 50 MCG tablet Take 50 mcg by mouth daily. 06/07/22   [provider]  lidocaine (XYLOCAINE) 2 % solution Mix 10 ml with 10 ml of Carafate an swish and swallow 4 times daily 08/10/22   Derek Jack, MD  lidocaine-prilocaine (EMLA) cream Apply 1 Application topically as needed (Local anesthesia for port). Patient not taking: Reported on 09/21/2022 07/27/22   Derek Jack, MD  metoprolol tartrate (LOPRESSOR) 25 MG tablet Take 25 mg by mouth 2 (two) times daily. Patient not taking: Reported on 10/10/2022    [provider]  morphine 10 MG/5ML solution Take 2.5 mLs (5 mg total) by mouth every 2 (two) hours as needed for severe pain. Dilute in 1tsp of water and swish, then spit 09/20/22   Derek Jack, MD  Omega-3 Fatty Acids (FISH OIL) 1000 MG CAPS Take by mouth.    [provider]  ondansetron (ZOFRAN) 4 MG tablet Take 1 tablet (4 mg total) by mouth every 8 (eight) hours as needed for nausea or vomiting. Patient not taking: Reported on 10/10/2022 08/29/22   Derek Jack, MD  pantoprazole (PROTONIX) 40 MG tablet Take 1 tablet (40 mg total) by mouth daily. 08/08/22   Derek Jack, MD  Polatuzumab Vedotin-piiq (POLIVY IV) Inject into the vein every 21 ( twenty-one) days. 08/01/22   [provider]  potassium chloride SA (KLOR-CON M) 20 MEQ tablet Take 1 tablet (20 mEq  total) by mouth daily for 5 days. 09/23/22 09/28/22  Pokhrel, Corrie Mckusick, MD  predniSONE (DELTASONE) 20 MG tablet Take 100 mg (5 tablets) by mouth daily with food on days 1-5 of chemotherapy 08/01/22   Derek Jack, MD  promethazine (PHENERGAN) 25 MG tablet Take 1 tablet (25 mg total) by mouth every 6 (six) hours as needed for nausea or vomiting. Patient not taking: Reported on 10/10/2022 08/01/22   Derek Jack, MD  RITUXIMAB IV Inject into the vein every 21 ( twenty-one)  days. 08/01/22   [provider]  sucralfate (CARAFATE) 1 GM/10ML suspension Mix 10 ml with 10 ml of lidocaine and swish and swallow 4 times daily 08/10/22   Derek Jack, MD  VITAMIN E PO Take 1 capsule by mouth daily.    [provider]      Allergies    Avocado, Banana, Latex, Lisinopril, Penicillins, Polivy [polatuzumab vedotin], Rituxan [rituximab], and Vancomycin    Review of Systems   Review of Systems  All other systems reviewed and are negative.   Physical Exam Updated Vital Signs BP 102/64   Pulse 98   Temp 99.5 F (37.5 C) (Oral)   Resp (!) 27   Ht '5\' 5"'$  (1.651 m)   Wt 73 kg   SpO2 96%   BMI 26.79 kg/m  Physical Exam Vitals and nursing note reviewed.  Constitutional:      General: She is not in acute distress.    Appearance: She is well-developed.  HENT:     Head: Normocephalic and atraumatic.  Eyes:     Conjunctiva/sclera: Conjunctivae normal.  Cardiovascular:     Rate and Rhythm: Regular rhythm. Tachycardia present.  Pulmonary:     Effort: Pulmonary effort is normal. No respiratory distress.     Breath sounds: Normal breath sounds. No stridor.  Abdominal:     General: There is no distension.  Skin:    General: Skin is warm and dry.  Neurological:     Mental Status: She is alert and oriented to person, place, and time.     Cranial Nerves: No cranial nerve deficit.  Psychiatric:        Mood and Affect: Mood normal.     ED Results / Procedures / Treatments   Labs (all labs ordered are listed, but only abnormal results are displayed) Labs Reviewed  COMPREHENSIVE METABOLIC PANEL - Abnormal; Notable for the following components:      Result Value   Glucose, Bld 121 (*)    AST 50 (*)    ALT 56 (*)    All other components within normal limits  LACTIC ACID, PLASMA - Abnormal; Notable for the following components:   Lactic Acid, Venous 2.6 (*)    All other components within normal limits  CBC WITH DIFFERENTIAL/PLATELET -  Abnormal; Notable for the following components:   WBC 1.9 (*)    Hemoglobin 11.1 (*)    HCT 34.6 (*)    Platelets 65 (*)    Neutro Abs 1.0 (*)    Lymphs Abs 0.2 (*)    Abs Immature Granulocytes 0.10 (*)    All other components within normal limits  URINALYSIS, ROUTINE W REFLEX MICROSCOPIC - Abnormal; Notable for the following components:   Color, Urine AMBER (*)    APPearance CLOUDY (*)    Hgb urine dipstick SMALL (*)    Ketones, ur 5 (*)    Protein, ur 100 (*)    Nitrite POSITIVE (*)    Leukocytes,Ua MODERATE (*)  WBC, UA >50 (*)    Bacteria, UA MANY (*)    All other components within normal limits  CULTURE, BLOOD (ROUTINE X 2)  CULTURE, BLOOD (ROUTINE X 2)  LACTIC ACID, PLASMA  PROTIME-INR    EKG None  Radiology DG Chest 2 View  Result Date: 10/18/2022 CLINICAL DATA:  Fever. EXAM: CHEST - 2 VIEW COMPARISON:  September 21, 2022.  October 06, 2022. FINDINGS: The heart size and mediastinal contours are within normal limits. Right internal jugular Port-A-Cath is unchanged. Left upper lobe density is noted laterally which most likely corresponds to abnormality seen on prior PET CT scan. The visualized skeletal structures are unremarkable. IMPRESSION: Left upper lobe density is noted laterally which most likely corresponds to abnormality seen on prior PET CT scan. It does not appear to be significantly changed compared to scout image of PET scan. It is uncertain if this represents inflammation or neoplasm. Electronically Signed   By: Marijo Conception M.D.   On: 10/18/2022 12:15    Procedures Procedures  {Document cardiac monitor, telemetry assessment procedure when appropriate:1}  Medications Ordered in ED Medications  cefTRIAXone (ROCEPHIN) 1 g in sodium chloride 0.9 % 100 mL IVPB (has no administration in time range)  sodium chloride 0.9 % bolus 1,000 mL (1,000 mLs Intravenous New Bag/Given 10/18/22 1417)    ED Course/ Medical Decision Making/ A&P   {   Click here for  ABCD2, HEART and other calculatorsREFRESH Note before signing :1}                          Medical Decision Making Adult female immunocompromised on chemotherapy for multiple myeloma presents with fever, fatigue, is awake, alert, minimally tachycardic, minimally febrile, not hypotensive.  Differential including opportunistic infection with heme compromised state, neutropenia, pneumonia, bacteremia, sepsis all considered.  Patient had labs fluids, monitoring.  With findings concerning for infection, urinary tract she received ceftriaxone after she confirmed that she can take cephalosporins.   Amount and/or Complexity of Data Reviewed External Data Reviewed: notes.    Details: Cancer Labs: ordered. Decision-making details documented in ED Course. Radiology: ordered and independent interpretation performed. Decision-making details documented in ED Course. Discussion of management or test interpretation with external provider(s): Paged to cancer team  Risk Prescription drug management. Decision regarding hospitalization.   3:17 PM Patient aware of all findings, in no distress, comfortable, now tachycardia is resolved, tachypnea has resolved, no evidence for bacteremia, sepsis, though she does have mild lactic acidosis, Final Clinical Impression(s) / ED Diagnoses Final diagnoses:  None    Rx / DC Orders ED Discharge Orders     None

## 2022-10-18 NOTE — ED Notes (Signed)
See triage notes. Pt ambulatory. Denies pain. Nad. Mild gen weakness noted

## 2022-10-18 NOTE — Discharge Instructions (Signed)
Your workup today shows a urinary tract infection.  You are being started on antibiotics which she can start tomorrow morning.  Follow-up with Dr. Raliegh Ip, call the office tomorrow for an appointment in the next couple days.  If your fever does not go away, you develop trouble breathing, vomiting, pain, or any other new/concerning symptoms then return to the ER for evaluation or call 911.

## 2022-10-18 NOTE — Telephone Encounter (Signed)
Patient called to inform of temp 101 and cough for 2 days.  Last treatment on the 15th.  Advised she present to the ER for assessment.  Verbalized understanding and Dr. Delton Coombes made aware.

## 2022-10-18 NOTE — ED Triage Notes (Signed)
Pt presents to ED from home C/O fever, currently receiving chemo treatments. Last tx 10/10/22. Tmax 101 at home. Endorses intermittent chest tightness. Denies cough, SOB, CP.

## 2022-10-18 NOTE — ED Notes (Signed)
Date and time results received: 10/18/22 1403 (use smartphrase ".now" to insert current time)  Test: lactic Critical Value: 2.6  Name of Provider Notified: Dr Vanita Panda  Orders Received? Or Actions Taken?: yes

## 2022-10-18 NOTE — ED Provider Notes (Signed)
Care transferred to me.  Patient appears to have a UTI.  She has had a fever though she looks quite well at this time.  Vital signs are normal.  Her initial lactate was 1.7 but then after fluids it went up despite her well appearance.  Discussed with Dr. Delton Coombes, given her well appearance and receiving IV Rocephin it is reasonable to discharge her home on Cipro (due to her penicillin allergy as he would prefer Augmentin) and he will follow her up in the next couple days.  She feels comfortable with this discharge as well.   Sherwood Gambler, MD 10/18/22 [phone]

## 2022-10-19 ENCOUNTER — Encounter: Payer: Self-pay | Admitting: *Deleted

## 2022-10-19 ENCOUNTER — Other Ambulatory Visit: Payer: Self-pay

## 2022-10-19 DIAGNOSIS — C8338 Diffuse large B-cell lymphoma, lymph nodes of multiple sites: Secondary | ICD-10-CM

## 2022-10-19 NOTE — Progress Notes (Signed)
Debbie Hall was contacted by telephone to verify understanding of discharge instructions status post their most recent discharge from the hospital on the date:  10/18/22.  Inpatient discharge AVS was re-reviewed with patient, along with cancer center appointments.  Verification of understanding for oncology specific follow-up was validated using the Teach Back method.    Transportation to appointments were confirmed for the patient as being self/caregiver.  Debbie Hall questions were addressed to their satisfaction upon completion of this post discharge follow-up call for outpatient oncology.

## 2022-10-20 ENCOUNTER — Inpatient Hospital Stay (HOSPITAL_BASED_OUTPATIENT_CLINIC_OR_DEPARTMENT_OTHER): Payer: Managed Care, Other (non HMO) | Admitting: Hematology

## 2022-10-20 ENCOUNTER — Inpatient Hospital Stay: Payer: Managed Care, Other (non HMO)

## 2022-10-20 VITALS — BP 134/70 | HR 93 | Temp 96.9°F | Resp 18 | Wt 162.6 lb

## 2022-10-20 DIAGNOSIS — C8338 Diffuse large B-cell lymphoma, lymph nodes of multiple sites: Secondary | ICD-10-CM

## 2022-10-20 DIAGNOSIS — Z95828 Presence of other vascular implants and grafts: Secondary | ICD-10-CM

## 2022-10-20 DIAGNOSIS — Z5111 Encounter for antineoplastic chemotherapy: Secondary | ICD-10-CM | POA: Diagnosis not present

## 2022-10-20 DIAGNOSIS — Z5112 Encounter for antineoplastic immunotherapy: Secondary | ICD-10-CM | POA: Diagnosis not present

## 2022-10-20 LAB — CBC WITH DIFFERENTIAL/PLATELET
Abs Immature Granulocytes: 0.4 10*3/uL — ABNORMAL HIGH (ref 0.00–0.07)
Band Neutrophils: 9 %
Basophils Absolute: 0 10*3/uL (ref 0.0–0.1)
Basophils Relative: 0 %
Blasts: 2 %
Eosinophils Absolute: 0.1 10*3/uL (ref 0.0–0.5)
Eosinophils Relative: 4 %
HCT: 29.5 % — ABNORMAL LOW (ref 36.0–46.0)
Hemoglobin: 9.2 g/dL — ABNORMAL LOW (ref 12.0–15.0)
Lymphocytes Relative: 7 %
Lymphs Abs: 0.2 10*3/uL — ABNORMAL LOW (ref 0.7–4.0)
MCH: 28 pg (ref 26.0–34.0)
MCHC: 31.2 g/dL (ref 30.0–36.0)
MCV: 89.9 fL (ref 80.0–100.0)
Metamyelocytes Relative: 7 %
Monocytes Absolute: 0.2 10*3/uL (ref 0.1–1.0)
Monocytes Relative: 7 %
Myelocytes: 8 %
Neutro Abs: 1.9 10*3/uL (ref 1.7–7.7)
Neutrophils Relative %: 56 %
Platelets: 100 10*3/uL — ABNORMAL LOW (ref 150–400)
RBC: 3.28 MIL/uL — ABNORMAL LOW (ref 3.87–5.11)
RDW: 14.3 % (ref 11.5–15.5)
WBC: 2.9 10*3/uL — ABNORMAL LOW (ref 4.0–10.5)
nRBC: 0 % (ref 0.0–0.2)

## 2022-10-20 LAB — COMPREHENSIVE METABOLIC PANEL
ALT: 41 U/L (ref 0–44)
AST: 44 U/L — ABNORMAL HIGH (ref 15–41)
Albumin: 3.7 g/dL (ref 3.5–5.0)
Alkaline Phosphatase: 64 U/L (ref 38–126)
Anion gap: 11 (ref 5–15)
BUN: 7 mg/dL — ABNORMAL LOW (ref 8–23)
CO2: 21 mmol/L — ABNORMAL LOW (ref 22–32)
Calcium: 8.5 mg/dL — ABNORMAL LOW (ref 8.9–10.3)
Chloride: 105 mmol/L (ref 98–111)
Creatinine, Ser: 0.68 mg/dL (ref 0.44–1.00)
GFR, Estimated: >60 mL/min (ref >60)
Glucose, Bld: 116 mg/dL — ABNORMAL HIGH (ref 70–99)
Potassium: 3.5 mmol/L (ref 3.5–5.1)
Sodium: 137 mmol/L (ref 135–145)
Total Bilirubin: 0.5 mg/dL (ref 0.3–1.2)
Total Protein: 6.5 g/dL (ref 6.5–8.1)

## 2022-10-20 LAB — MAGNESIUM: Magnesium: 2 mg/dL (ref 1.7–2.4)

## 2022-10-20 MED ORDER — SODIUM CHLORIDE 0.9% FLUSH
10.0000 mL | INTRAVENOUS | Status: DC | PRN
  Administered 2022-10-20: 10 mL via INTRAVENOUS

## 2022-10-20 MED ORDER — HEPARIN SOD (PORK) LOCK FLUSH 100 UNIT/ML IV SOLN
500.0000 [IU] | Freq: Once | INTRAVENOUS | Status: AC
  Administered 2022-10-20: 500 [IU] via INTRAVENOUS

## 2022-10-20 MED ORDER — SODIUM CHLORIDE 0.9% FLUSH
10.0000 mL | Freq: Once | INTRAVENOUS | Status: AC
  Administered 2022-10-20: 10 mL via INTRAVENOUS

## 2022-10-20 NOTE — Progress Notes (Signed)
Patient here today for port flush and labs per MD orders.  She saw Dr. Delton Coombes and did not need any additional fluids or medications.  Okay to proceed with deaccessing port.  Patient remained stable during this time. She was discharged ambulatory and in stable condition from clinic. She will follow up at next scheduled appt.

## 2022-10-20 NOTE — Progress Notes (Signed)
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  PRIOR THERAPY: None  NGS Results: FISH results pending  CURRENT THERAPY: Pola-R-CHP  BRIEF ONCOLOGIC HISTORY:  Oncology History  Diffuse large B cell lymphoma ([place])  07/27/2022 Initial Diagnosis   Diffuse large B cell lymphoma ([place])   07/27/2022 Cancer Staging   Staging form: Hodgkin and Non-Hodgkin Lymphoma, AJCC 8th Edition - Clinical stage from 07/27/2022: Stage III (Diffuse large B-cell lymphoma) - Signed by Derek Jack, MD on 07/27/2022 Histopathologic type: Malignant lymphoma, large B-cell, diffuse, NOS Stage prefix: Initial diagnosis   08/01/2022 -  Chemotherapy   Patient is on Treatment Plan : NON-HODGKINS LYMPHOMA Pola-R-CHP (Polatuzumab + Rituximab -CHP) q21d       CANCER STAGING:  Cancer Staging  Diffuse large B cell lymphoma ([place]) Staging form: Hodgkin and Non-Hodgkin Lymphoma, AJCC 8th Edition - Clinical stage from 07/27/2022: Stage III (Diffuse large B-cell lymphoma) - Signed by Derek Jack, MD on 07/27/2022   INTERVAL HISTORY:  Debbie Hall 62 y.o. female seen for follow-up after recent ER visit.  She went to the ER on 10/18/2022 when she had 101 temperature at home.  She was found to have mild neutropenia and UA positive for infection.  She was sent home on ciprofloxacin.  She reports some dyspnea on exertion.  She reports that she cannot sleep flat since she had COVID end of December.  REVIEW OF SYSTEMS:  Review of Systems  Respiratory:  Positive for shortness of breath.   Psychiatric/Behavioral:  Positive for sleep disturbance. The patient is nervous/anxious.   All other systems reviewed and are negative.    PAST MEDICAL/SURGICAL HISTORY:  Past Medical History:  Diagnosis  Date   Anemia    Anxiety    Diabetes mellitus ([place])    Fatty liver    GERD (gastroesophageal reflux disease)    Gout    Hyperlipidemia    Hypertension    Lymphoma ([place])    Migraines    Pneumonia    as a child   Port-A-Cath in place 07/28/2022   Past Surgical History:  Procedure Laterality Date   ABDOMINAL HYSTERECTOMY     partial   CESAREAN SECTION     x 2   TONSILLECTOMY       SOCIAL HISTORY:  Social History   Socioeconomic History   Marital status: Married    Spouse name: Not on file   Number of children: 4   Years of education: Not on file   Highest education level: Not on file  Occupational History   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    Comment: [employer]  Tobacco Use   Smoking status: Never   Smokeless tobacco: Never  Vaping Use   Vaping Use: Never used  Substance and Sexual Activity   Alcohol use: Yes    Comment: occasional   Drug use: Never   Sexual activity: Not on file  Other Topics Concern   Not on file  Social History Narrative   Not on file   Social Determinants of Health   Financial Resource Strain: Not on file  Food Insecurity: No Food Insecurity (09/21/2022)   Hunger Vital Sign    Worried About Running Out of Food in the Last Year: Never true    Ran Out  of Food in the Last Year: Never true  Transportation Needs: No Transportation Needs (09/21/2022)   PRAPARE - Hydrologist (Medical): No    Lack of Transportation (Non-Medical): No  Physical Activity: Not on file  Stress: Not on file  Social Connections: Not on file  Intimate Partner Violence: Not At Risk (09/21/2022)   Humiliation, Afraid, Rape, and Kick questionnaire    Fear of Current or Ex-Partner: No    Emotionally Abused: No    Physically Abused: No    Sexually Abused: No    FAMILY HISTORY:  Family History  Problem Relation Age of Onset   Hypertension Mother    Heart disease Mother    Heart attack Mother    Pancreatic cancer  Mother    Prostate cancer Father    Colon cancer Neg Hx    Esophageal cancer Neg Hx    Rectal cancer Neg Hx     CURRENT MEDICATIONS:  Outpatient Encounter Medications as of 10/20/2022  Medication Sig Note   allopurinol (ZYLOPRIM) 300 MG tablet Take 1 tablet (300 mg total) by mouth daily.    ALPRAZolam (XANAX) 1 MG tablet Take 1 mg by mouth 3 (three) times daily as needed for anxiety or sleep.    ciprofloxacin (CIPRO) 500 MG tablet Take 1 tablet (500 mg total) by mouth 2 (two) times daily.    CYCLOPHOSPHAMIDE IV Inject into the vein every 21 ( twenty-one) days.    DOXORUBICIN HCL IV Inject into the vein every 21 ( twenty-one) days.    escitalopram (LEXAPRO) 5 MG tablet Take 1 tablet by mouth daily.    guaiFENesin-dextromethorphan (ROBITUSSIN DM) 100-10 MG/5ML syrup Take 5 mLs by mouth every 4 (four) hours as needed for cough.    ibuprofen (ADVIL) 200 MG tablet Take 600 mg by mouth every 6 (six) hours as needed for moderate pain.    Insulin Glargine w/ Trans Port 100 UNIT/ML SOPN Inject 10 Units into the skin daily.    Insulin Lispro (HUMALOG KWIKPEN [place]) Inject 2-10 Units into the skin daily as needed (blood sugar). Sliding scale 08/30/2022: Only PRN had to use because pt was on Prednisone.   levothyroxine (SYNTHROID) 50 MCG tablet Take 50 mcg by mouth daily.    lidocaine (XYLOCAINE) 2 % solution Mix 10 ml with 10 ml of Carafate an swish and swallow 4 times daily    lidocaine-prilocaine (EMLA) cream Apply 1 Application topically as needed (Local anesthesia for port).    morphine 10 MG/5ML solution Take 2.5 mLs (5 mg total) by mouth every 2 (two) hours as needed for severe pain. Dilute in 1tsp of water and swish, then spit 09/21/2022: Have not picked up medication   ondansetron (ZOFRAN) 4 MG tablet Take 1 tablet (4 mg total) by mouth every 8 (eight) hours as needed for nausea or vomiting.    pantoprazole (PROTONIX) 40 MG tablet Take 1 tablet (40 mg total) by mouth daily.    Polatuzumab  Vedotin-piiq (POLIVY IV) Inject into the vein every 21 ( twenty-one) days.    predniSONE (DELTASONE) 20 MG tablet Take 100 mg (5 tablets) by mouth daily with food on days 1-5 of chemotherapy 10/18/2022: From 10/10/22-10/15/22   promethazine (PHENERGAN) 25 MG tablet Take 1 tablet (25 mg total) by mouth every 6 (six) hours as needed for nausea or vomiting.    RITUXIMAB IV Inject into the vein every 21 ( twenty-one) days.    sucralfate (CARAFATE) 1 GM/10ML suspension Mix 10 ml with  10 ml of lidocaine and swish and swallow 4 times daily    potassium chloride SA (KLOR-CON M) 20 MEQ tablet Take 1 tablet (20 mEq total) by mouth daily for 5 days. (Patient not taking: Reported on 10/18/2022)    [EXPIRED] sodium chloride flush (NS) 0.9 % injection 10 mL     No facility-administered encounter medications on file as of 10/20/2022.    ALLERGIES:  Allergies  Allergen Reactions   Avocado Itching   Banana Itching   Latex Itching    Eyes watering, itching.   Lisinopril     angioedema   Penicillins     Facial swelling   Polivy [Polatuzumab Vedotin] Other (See Comments)    Dry throat and trouble catching her breath.   Rituxan [Rituximab]     Itching in her throat    Vancomycin Itching     PHYSICAL EXAM:  ECOG Performance status: 0  There were no vitals filed for this visit.  There were no vitals filed for this visit.   Physical Exam Vitals reviewed.  Constitutional:      Appearance: Normal appearance.  Cardiovascular:     Rate and Rhythm: Normal rate and regular rhythm.  Pulmonary:     Breath sounds: Normal breath sounds.  Abdominal:     Palpations: Abdomen is soft. There is no mass.  Neurological:     Mental Status: She is alert.  Psychiatric:        Mood and Affect: Mood normal.        Behavior: Behavior normal.    Right groin lymphadenopathy palpable.  LABORATORY DATA:  I have reviewed the labs as listed.  CBC    Component Value Date/Time   WBC 2.9 (L) 10/20/2022 0900    RBC 3.28 (L) 10/20/2022 0900   HGB 9.2 (L) 10/20/2022 0900   HCT 29.5 (L) 10/20/2022 0900   PLT 100 (L) 10/20/2022 0900   MCV 89.9 10/20/2022 0900   MCH 28.0 10/20/2022 0900   MCHC 31.2 10/20/2022 0900   RDW 14.3 10/20/2022 0900   LYMPHSABS 0.2 (L) 10/20/2022 0900   MONOABS 0.2 10/20/2022 0900   EOSABS 0.1 10/20/2022 0900   BASOSABS 0.0 10/20/2022 0900      Latest Ref Rng & Units 10/20/2022    9:00 AM 10/18/2022   11:53 AM 10/10/2022    7:59 AM  CMP  Glucose 70 - 99 mg/dL 116  121  114   BUN 8 - 23 mg/dL '7  9  11   '$ Creatinine 0.44 - 1.00 mg/dL 0.68  0.77  0.63   Sodium 135 - 145 mmol/L 137  136  138   Potassium 3.5 - 5.1 mmol/L 3.5  4.2  4.0   Chloride 98 - 111 mmol/L 105  99  106   CO2 22 - 32 mmol/L '21  22  21   '$ Calcium 8.9 - 10.3 mg/dL 8.5  9.4  8.8   Total Protein 6.5 - 8.1 g/dL 6.5  7.3  6.5   Total Bilirubin 0.3 - 1.2 mg/dL 0.5  0.8  0.6   Alkaline Phos 38 - 126 U/L 64  78  66   AST 15 - 41 U/L 44  50  47   ALT 0 - 44 U/L 41  56  35     DIAGNOSTIC IMAGING:  I have independently reviewed the scans and discussed with the patient.  ASSESSMENT:  1.  Stage III DLBCL: - CT soft tissue neck (06/06/2022): Left supraclavicular mass 1.9 x 1.6  cm central hypodensity.  Smaller adjacent nodule present.  Oval-shaped mass anterior to the left IJ vein 2.9 x 1.8 cm.  No definite 2 exophytic oropharyngeal or hypopharyngeal mass noted.  Thyroid intact.  Lung bases clear. - Left supraclavicular lymph node biopsy by Dr. Ladona Horns on 06/10/2022 - Pathology: Atypical B-cell lymphoid infiltrate highly concerning for a lymphoproliferative disorder, possibly high-grade in the background of extensive granulomatous type inflammation and necrosis. - 2D echo (06/28/2022): LVEF 60-65%. - PET scan (07/07/2022): Markedly hypermetabolic adenopathy in the neck, chest, pelvis and inguinal regions.  Few subcentimeter pulmonary nodules are too small for PET resolution.  Hepatic steatosis. - Right inguinal lymph  node biopsy (07/20/2022): Large B-cell lymphoma with Ki-67 70%.  FISH studies for high risk lymphoma sent. - IPI score-3 (age more than 82, stage III, elevated LDH) - 2D echo (06/28/2022): EF 60 to 65%. - Cycle 1 Pola-R-CHP on 08/01/2022   2.  Social/family history: - She lives at home with her husband.  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  Non-smoker.  No history of exposure to chemicals or pesticides. - Mother died of pancreatic cancer.  Father had prostate cancer.  Sister had breast cancer at age 25.  Brother had kidney cancer at age 86.    PLAN:  1.  Stage III DLBCL: - Cycle 4 of chemotherapy on 10/10/2022. - I have reviewed labs from recent ER visit.  Reviewed chest x-ray which has infiltrate in the left upper lobe consistent with previous PET scan findings. - UA was positive for UTI.  She is on Cipro.  Blood cultures did not show any growth in 1 day.  Today white count improved to 2.9 with ANC of 1.9.  Rest of the lab work is within normal limits. - She may complete her full course of ciprofloxacin.  RTC per schedule prior to cycle 5.  2.  Acid reflux: - Continue Protonix 40 mg daily.  3.  Nausea: - Continue Zofran as needed.  5.  Mouth ulcers: - Use lidocaine and Carafate as needed.    Orders placed this encounter:  No orders of the defined types were placed in this encounter.       Derek Jack, MD [facility] [phone]

## 2022-10-20 NOTE — Patient Instructions (Addendum)
[place] at [facility] Discharge Instructions   You were seen and examined today by Dr. Delton Coombes.  He reviewed the results of your lab work which are normal/stable.   Return as scheduled for your next treatment.    Thank you for choosing [place] at [facility] to provide your oncology and hematology care.  To afford each patient quality time with our provider, please arrive at least 15 minutes before your scheduled appointment time.   If you have a lab appointment with the [facility] please come in thru the Main Entrance and check in at the main information desk.  You need to re-schedule your appointment should you arrive 10 or more minutes late.  We strive to give you quality time with our providers, and arriving late affects you and other patients whose appointments are after yours.  Also, if you no show three or more times for appointments you may be dismissed from the clinic at the providers discretion.     Again, thank you for choosing [facility].  Our hope is that these requests will decrease the amount of time that you wait before being seen by our physicians.       _____________________________________________________________  Should you have questions after your visit to [facility], please contact our office at [phone] and follow the prompts.  Our office hours are 8:00 a.m. and 4:30 p.m. Monday - Friday.  Please note that voicemails left after 4:00 p.m. may not be returned until the following business day.  We are closed weekends and major holidays.  You do have access to a nurse 24-7, just call the main number to the clinic [phone] and do not press any options, hold on the line and a nurse will answer the phone.    For prescription refill requests, have your pharmacy contact our office and allow 72 hours.    Due to Covid, you will need to wear a mask upon entering the hospital. If you do  not have a mask, a mask will be given to you at the Main Entrance upon arrival. For doctor visits, patients may have 1 support person age 86 or older with them. For treatment visits, patients can not have anyone with them due to social distancing guidelines and our immunocompromised population.

## 2022-10-22 LAB — PATHOLOGIST SMEAR REVIEW

## 2022-10-23 LAB — CULTURE, BLOOD (ROUTINE X 2)
Culture: NO GROWTH
Culture: NO GROWTH
Special Requests: ADEQUATE

## 2022-10-24 ENCOUNTER — Other Ambulatory Visit: Payer: BC Managed Care – PPO

## 2022-10-24 ENCOUNTER — Ambulatory Visit: Payer: BC Managed Care – PPO | Admitting: Hematology

## 2022-10-24 ENCOUNTER — Ambulatory Visit: Payer: BC Managed Care – PPO

## 2022-10-31 ENCOUNTER — Inpatient Hospital Stay: Payer: Managed Care, Other (non HMO) | Admitting: Dietician

## 2022-10-31 ENCOUNTER — Encounter: Payer: Self-pay | Admitting: Hematology

## 2022-10-31 ENCOUNTER — Inpatient Hospital Stay: Payer: Managed Care, Other (non HMO)

## 2022-10-31 ENCOUNTER — Inpatient Hospital Stay: Payer: Managed Care, Other (non HMO) | Attending: Hematology

## 2022-10-31 ENCOUNTER — Inpatient Hospital Stay (HOSPITAL_BASED_OUTPATIENT_CLINIC_OR_DEPARTMENT_OTHER): Payer: Managed Care, Other (non HMO) | Admitting: Hematology

## 2022-10-31 VITALS — BP 146/87 | HR 70 | Temp 97.3°F | Resp 18

## 2022-10-31 DIAGNOSIS — Z5111 Encounter for antineoplastic chemotherapy: Secondary | ICD-10-CM | POA: Insufficient documentation

## 2022-10-31 DIAGNOSIS — K219 Gastro-esophageal reflux disease without esophagitis: Secondary | ICD-10-CM | POA: Diagnosis not present

## 2022-10-31 DIAGNOSIS — C8338 Diffuse large B-cell lymphoma, lymph nodes of multiple sites: Secondary | ICD-10-CM

## 2022-10-31 DIAGNOSIS — Z5112 Encounter for antineoplastic immunotherapy: Secondary | ICD-10-CM | POA: Diagnosis present

## 2022-10-31 DIAGNOSIS — Z800 Family history of malignant neoplasm of digestive organs: Secondary | ICD-10-CM | POA: Diagnosis not present

## 2022-10-31 DIAGNOSIS — C8330 Diffuse large B-cell lymphoma, unspecified site: Secondary | ICD-10-CM | POA: Diagnosis present

## 2022-10-31 DIAGNOSIS — Z79899 Other long term (current) drug therapy: Secondary | ICD-10-CM | POA: Diagnosis not present

## 2022-10-31 DIAGNOSIS — C8590 Non-Hodgkin lymphoma, unspecified, unspecified site: Secondary | ICD-10-CM

## 2022-10-31 DIAGNOSIS — Z803 Family history of malignant neoplasm of breast: Secondary | ICD-10-CM | POA: Diagnosis not present

## 2022-10-31 DIAGNOSIS — Z95828 Presence of other vascular implants and grafts: Secondary | ICD-10-CM

## 2022-10-31 DIAGNOSIS — G479 Sleep disorder, unspecified: Secondary | ICD-10-CM | POA: Insufficient documentation

## 2022-10-31 DIAGNOSIS — K1230 Oral mucositis (ulcerative), unspecified: Secondary | ICD-10-CM | POA: Diagnosis not present

## 2022-10-31 DIAGNOSIS — R110 Nausea: Secondary | ICD-10-CM | POA: Diagnosis not present

## 2022-10-31 DIAGNOSIS — Z7952 Long term (current) use of systemic steroids: Secondary | ICD-10-CM | POA: Diagnosis not present

## 2022-10-31 DIAGNOSIS — Z5189 Encounter for other specified aftercare: Secondary | ICD-10-CM | POA: Insufficient documentation

## 2022-10-31 LAB — CBC WITH DIFFERENTIAL/PLATELET
Abs Immature Granulocytes: 0.06 10*3/uL (ref 0.00–0.07)
Basophils Absolute: 0 10*3/uL (ref 0.0–0.1)
Basophils Relative: 1 %
Eosinophils Absolute: 0 10*3/uL (ref 0.0–0.5)
Eosinophils Relative: 1 %
HCT: 31.3 % — ABNORMAL LOW (ref 36.0–46.0)
Hemoglobin: 10.1 g/dL — ABNORMAL LOW (ref 12.0–15.0)
Immature Granulocytes: 1 %
Lymphocytes Relative: 6 %
Lymphs Abs: 0.3 10*3/uL — ABNORMAL LOW (ref 0.7–4.0)
MCH: 29.2 pg (ref 26.0–34.0)
MCHC: 32.3 g/dL (ref 30.0–36.0)
MCV: 90.5 fL (ref 80.0–100.0)
Monocytes Absolute: 0.3 10*3/uL (ref 0.1–1.0)
Monocytes Relative: 7 %
Neutro Abs: 3.9 10*3/uL (ref 1.7–7.7)
Neutrophils Relative %: 84 %
Platelets: 219 10*3/uL (ref 150–400)
RBC: 3.46 MIL/uL — ABNORMAL LOW (ref 3.87–5.11)
RDW: 16.2 % — ABNORMAL HIGH (ref 11.5–15.5)
WBC: 4.6 10*3/uL (ref 4.0–10.5)
nRBC: 0 % (ref 0.0–0.2)

## 2022-10-31 LAB — COMPREHENSIVE METABOLIC PANEL
ALT: 35 U/L (ref 0–44)
AST: 50 U/L — ABNORMAL HIGH (ref 15–41)
Albumin: 3.8 g/dL (ref 3.5–5.0)
Alkaline Phosphatase: 61 U/L (ref 38–126)
Anion gap: 8 (ref 5–15)
BUN: 12 mg/dL (ref 8–23)
CO2: 21 mmol/L — ABNORMAL LOW (ref 22–32)
Calcium: 8.6 mg/dL — ABNORMAL LOW (ref 8.9–10.3)
Chloride: 108 mmol/L (ref 98–111)
Creatinine, Ser: 0.65 mg/dL (ref 0.44–1.00)
GFR, Estimated: >60 mL/min (ref >60)
Glucose, Bld: 111 mg/dL — ABNORMAL HIGH (ref 70–99)
Potassium: 4.1 mmol/L (ref 3.5–5.1)
Sodium: 137 mmol/L (ref 135–145)
Total Bilirubin: 0.4 mg/dL (ref 0.3–1.2)
Total Protein: 6.2 g/dL — ABNORMAL LOW (ref 6.5–8.1)

## 2022-10-31 LAB — MAGNESIUM: Magnesium: 2 mg/dL (ref 1.7–2.4)

## 2022-10-31 MED ORDER — HEPARIN SOD (PORK) LOCK FLUSH 100 UNIT/ML IV SOLN
500.0000 [IU] | Freq: Once | INTRAVENOUS | Status: AC | PRN
  Administered 2022-10-31: 500 [IU]

## 2022-10-31 MED ORDER — CETIRIZINE HCL 10 MG/ML IV SOLN
10.0000 mg | Freq: Once | INTRAVENOUS | Status: AC
  Administered 2022-10-31: 10 mg via INTRAVENOUS
  Filled 2022-10-31: qty 1

## 2022-10-31 MED ORDER — SODIUM CHLORIDE 0.9 % IV SOLN
10.0000 mg | Freq: Once | INTRAVENOUS | Status: AC
  Administered 2022-10-31: 10 mg via INTRAVENOUS
  Filled 2022-10-31: qty 10

## 2022-10-31 MED ORDER — PALONOSETRON HCL INJECTION 0.25 MG/5ML
0.2500 mg | Freq: Once | INTRAVENOUS | Status: AC
  Administered 2022-10-31: 0.25 mg via INTRAVENOUS
  Filled 2022-10-31: qty 5

## 2022-10-31 MED ORDER — SODIUM CHLORIDE 0.9% FLUSH
10.0000 mL | INTRAVENOUS | Status: DC | PRN
  Administered 2022-10-31: 10 mL

## 2022-10-31 MED ORDER — DOXORUBICIN HCL CHEMO IV INJECTION 2 MG/ML
40.0000 mg/m2 | Freq: Once | INTRAVENOUS | Status: AC
  Administered 2022-10-31: 76 mg via INTRAVENOUS
  Filled 2022-10-31: qty 38

## 2022-10-31 MED ORDER — SODIUM CHLORIDE 0.9 % IV SOLN: Freq: Once | INTRAVENOUS | Status: AC

## 2022-10-31 MED ORDER — FAMOTIDINE IN NACL 20-0.9 MG/50ML-% IV SOLN
20.0000 mg | Freq: Once | INTRAVENOUS | Status: AC
  Administered 2022-10-31: 20 mg via INTRAVENOUS
  Filled 2022-10-31: qty 50

## 2022-10-31 MED ORDER — PEGFILGRASTIM 6 MG/0.6ML ~~LOC~~ PSKT
6.0000 mg | PREFILLED_SYRINGE | Freq: Once | SUBCUTANEOUS | Status: AC
  Administered 2022-10-31: 6 mg via SUBCUTANEOUS
  Filled 2022-10-31: qty 0.6

## 2022-10-31 MED ORDER — SODIUM CHLORIDE 0.9 % IV SOLN
1.8100 mg/kg | Freq: Once | INTRAVENOUS | Status: DC
  Filled 2022-10-31: qty 7

## 2022-10-31 MED ORDER — SODIUM CHLORIDE 0.9 % IV SOLN
600.0000 mg/m2 | Freq: Once | INTRAVENOUS | Status: AC
  Administered 2022-10-31: 1120 mg via INTRAVENOUS
  Filled 2022-10-31: qty 56

## 2022-10-31 MED ORDER — SODIUM CHLORIDE 0.9 % IV SOLN
150.0000 mg | Freq: Once | INTRAVENOUS | Status: AC
  Administered 2022-10-31: 150 mg via INTRAVENOUS
  Filled 2022-10-31: qty 150

## 2022-10-31 MED ORDER — ACETAMINOPHEN 325 MG PO TABS
650.0000 mg | ORAL_TABLET | Freq: Once | ORAL | Status: AC
  Administered 2022-10-31: 650 mg via ORAL
  Filled 2022-10-31: qty 2

## 2022-10-31 MED ORDER — SODIUM CHLORIDE 0.9 % IV SOLN
1.8100 mg/kg | Freq: Once | INTRAVENOUS | Status: AC
  Administered 2022-10-31: 140 mg via INTRAVENOUS
  Filled 2022-10-31: qty 7

## 2022-10-31 MED ORDER — SODIUM CHLORIDE 0.9 % IV SOLN
375.0000 mg/m2 | Freq: Once | INTRAVENOUS | Status: AC
  Administered 2022-10-31: 700 mg via INTRAVENOUS
  Filled 2022-10-31: qty 50

## 2022-10-31 MED ORDER — SODIUM CHLORIDE 0.9% FLUSH
10.0000 mL | INTRAVENOUS | Status: DC | PRN
  Administered 2022-10-31: 10 mL via INTRAVENOUS

## 2022-10-31 NOTE — Progress Notes (Signed)
Patients port flushed without difficulty.  Good blood return noted with no bruising or swelling noted at site.  Patient remains accessed for chemotherapy treatment.  

## 2022-10-31 NOTE — Progress Notes (Signed)
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  PRIOR THERAPY: None  NGS Results: FISH results pending  CURRENT THERAPY: Pola-R-CHP  BRIEF ONCOLOGIC HISTORY:  Oncology History  Diffuse large B cell lymphoma ([place])  07/27/2022 Initial Diagnosis   Diffuse large B cell lymphoma ([place])   07/27/2022 Cancer Staging   Staging form: Hodgkin and Non-Hodgkin Lymphoma, AJCC 8th Edition - Clinical stage from 07/27/2022: Stage III (Diffuse large B-cell lymphoma) - Signed by Derek Jack, MD on 07/27/2022 Histopathologic type: Malignant lymphoma, large B-cell, diffuse, NOS Stage prefix: Initial diagnosis   08/01/2022 -  Chemotherapy   Patient is on Treatment Plan : NON-HODGKINS LYMPHOMA Pola-R-CHP (Polatuzumab + Rituximab -CHP) q21d       CANCER STAGING:  Cancer Staging  Diffuse large B cell lymphoma ([place]) Staging form: Hodgkin and Non-Hodgkin Lymphoma, AJCC 8th Edition - Clinical stage from 07/27/2022: Stage III (Diffuse large B-cell lymphoma) - Signed by Derek Jack, MD on 07/27/2022   INTERVAL HISTORY:  Debbie Hall 62 y.o. female seen for follow-up for stage III large B-cell lymphoma and toxicity check prior to cycle 5 of Pola-R-CHP. She was last seen by me on 10/20/22.  Today, she states that she is doing well overall. Her appetite level is at 50%. She states that she does not always have the desire to eat but will make herself eat. Her energy level is at 65%. She has some nausea which is relieved with Phenergan after treatments and Zofran otherwise. She states that she does not sleep well unless she is sitting up. She denies any pain or fevers.  She reports that she did complete antibiotics for her UTI last month with resolution of symptoms.   REVIEW OF  SYSTEMS:  Review of Systems  Constitutional:  Positive for appetite change. Negative for chills, fatigue and fever.  HENT:   Negative for lump/mass, mouth sores, nosebleeds, sore throat and trouble swallowing.   Eyes:  Negative for eye problems.  Respiratory:  Positive for cough and shortness of breath.   Cardiovascular:  Negative for chest pain, leg swelling and palpitations.  Gastrointestinal:  Positive for nausea. Negative for abdominal pain, constipation, diarrhea and vomiting.  Genitourinary:  Negative for bladder incontinence, difficulty urinating, dysuria, frequency, hematuria and nocturia.   Musculoskeletal:  Negative for arthralgias, back pain, flank pain, myalgias and neck pain.  Skin:  Negative for itching and rash.  Neurological:  Negative for dizziness, headaches and numbness.  Hematological:  Does not bruise/bleed easily.  Psychiatric/Behavioral:  Positive for sleep disturbance. Negative for depression and suicidal ideas. The patient is nervous/anxious.   All other systems reviewed and are negative.    PAST MEDICAL/SURGICAL HISTORY:  Past Medical History:  Diagnosis Date   Anemia    Anxiety    Diabetes mellitus ([place])    Fatty liver    GERD (gastroesophageal reflux disease)    Gout    Hyperlipidemia    Hypertension    Lymphoma ([place])    Migraines    Pneumonia    as a child   Port-A-Cath in place 07/28/2022   Past Surgical History:  Procedure Laterality Date   ABDOMINAL HYSTERECTOMY     partial   CESAREAN SECTION     x 2   TONSILLECTOMY  SOCIAL HISTORY:  Social History   Socioeconomic History   Marital status: Married    Spouse name: Not on file   Number of children: 4   Years of education: Not on file   Highest education level: Not on file  Occupational History   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    Comment: [employer]  Tobacco Use   Smoking status: Never   Smokeless tobacco: Never  Vaping Use   Vaping Use: Never used   Substance and Sexual Activity   Alcohol use: Yes    Comment: occasional   Drug use: Never   Sexual activity: Not on file  Other Topics Concern   Not on file  Social History Narrative   Not on file   Social Determinants of Health   Financial Resource Strain: Not on file  Food Insecurity: No Food Insecurity (09/21/2022)   Hunger Vital Sign    Worried About Running Out of Food in the Last Year: Never true    Ran Out of Food in the Last Year: Never true  Transportation Needs: No Transportation Needs (09/21/2022)   PRAPARE - Hydrologist (Medical): No    Lack of Transportation (Non-Medical): No  Physical Activity: Not on file  Stress: Not on file  Social Connections: Not on file  Intimate Partner Violence: Not At Risk (09/21/2022)   Humiliation, Afraid, Rape, and Kick questionnaire    Fear of Current or Ex-Partner: No    Emotionally Abused: No    Physically Abused: No    Sexually Abused: No    FAMILY HISTORY:  Family History  Problem Relation Age of Onset   Hypertension Mother    Heart disease Mother    Heart attack Mother    Pancreatic cancer Mother    Prostate cancer Father    Colon cancer Neg Hx    Esophageal cancer Neg Hx    Rectal cancer Neg Hx     CURRENT MEDICATIONS:   Current Outpatient Medications:    allopurinol (ZYLOPRIM) 300 MG tablet, Take 1 tablet (300 mg total) by mouth daily., Disp: 30 tablet, Rfl: 3   ALPRAZolam (XANAX) 1 MG tablet, Take 1 mg by mouth 3 (three) times daily as needed for anxiety or sleep., Disp: , Rfl:    ciprofloxacin (CIPRO) 500 MG tablet, Take 1 tablet (500 mg total) by mouth 2 (two) times daily., Disp: 14 tablet, Rfl: 0   CYCLOPHOSPHAMIDE IV, Inject into the vein every 21 ( twenty-one) days., Disp: , Rfl:    DOXORUBICIN HCL IV, Inject into the vein every 21 ( twenty-one) days., Disp: , Rfl:    escitalopram (LEXAPRO) 5 MG tablet, Take 1 tablet by mouth daily., Disp: , Rfl:     guaiFENesin-dextromethorphan (ROBITUSSIN DM) 100-10 MG/5ML syrup, Take 5 mLs by mouth every 4 (four) hours as needed for cough., Disp: 118 mL, Rfl: 0   ibuprofen (ADVIL) 200 MG tablet, Take 600 mg by mouth every 6 (six) hours as needed for moderate pain., Disp: , Rfl:    Insulin Glargine w/ Trans Port 100 UNIT/ML SOPN, Inject 10 Units into the skin daily., Disp: , Rfl:    Insulin Lispro (HUMALOG KWIKPEN [place]), Inject 2-10 Units into the skin daily as needed (blood sugar). Sliding scale, Disp: , Rfl:    levothyroxine (SYNTHROID) 50 MCG tablet, Take 50 mcg by mouth daily., Disp: , Rfl:    lidocaine (XYLOCAINE) 2 % solution, Mix 10 ml with 10 ml of Carafate an swish  and swallow 4 times daily, Disp: 200 mL, Rfl: 1   lidocaine-prilocaine (EMLA) cream, Apply 1 Application topically as needed (Local anesthesia for port)., Disp: 30 g, Rfl: 6   morphine 10 MG/5ML solution, Take 2.5 mLs (5 mg total) by mouth every 2 (two) hours as needed for severe pain. Dilute in 1tsp of water and swish, then spit, Disp: 100 mL, Rfl: 0   ondansetron (ZOFRAN) 4 MG tablet, Take 1 tablet (4 mg total) by mouth every 8 (eight) hours as needed for nausea or vomiting., Disp: 60 tablet, Rfl: 3   pantoprazole (PROTONIX) 40 MG tablet, Take 1 tablet (40 mg total) by mouth daily., Disp: 30 tablet, Rfl: 4   Polatuzumab Vedotin-piiq (POLIVY IV), Inject into the vein every 21 ( twenty-one) days., Disp: , Rfl:    predniSONE (DELTASONE) 20 MG tablet, Take 100 mg (5 tablets) by mouth daily with food on days 1-5 of chemotherapy, Disp: 25 tablet, Rfl: 5   promethazine (PHENERGAN) 25 MG tablet, Take 1 tablet (25 mg total) by mouth every 6 (six) hours as needed for nausea or vomiting., Disp: 30 tablet, Rfl: 4   RITUXIMAB IV, Inject into the vein every 21 ( twenty-one) days., Disp: , Rfl:    sucralfate (CARAFATE) 1 GM/10ML suspension, Mix 10 ml with 10 ml of lidocaine and swish and swallow 4 times daily, Disp: 420 mL, Rfl: 1   potassium chloride SA  (KLOR-CON M) 20 MEQ tablet, Take 1 tablet (20 mEq total) by mouth daily for 5 days. (Patient not taking: Reported on 10/18/2022), Disp: 5 tablet, Rfl: 0 No current facility-administered medications for this visit.  Facility-Administered Medications Ordered in Other Visits:    sodium chloride flush (NS) 0.9 % injection 10 mL, 10 mL, Intravenous, PRN, Derek Jack, MD, 10 mL at 10/31/22 0810    ALLERGIES:  Allergies  Allergen Reactions   Avocado Itching   Banana Itching   Latex Itching    Eyes watering, itching.   Lisinopril     angioedema   Penicillins     Facial swelling   Polivy [Polatuzumab Vedotin] Other (See Comments)    Dry throat and trouble catching her breath.   Rituxan [Rituximab]     Itching in her throat    Vancomycin Itching     PHYSICAL EXAM:  ECOG Performance status: 0  There were no vitals filed for this visit.  There were no vitals filed for this visit.   Physical Exam Vitals reviewed. Exam conducted with a chaperone present.  Constitutional:      Appearance: Normal appearance.  Cardiovascular:     Rate and Rhythm: Normal rate and regular rhythm.     Pulses: Normal pulses.     Heart sounds: Normal heart sounds.  Pulmonary:     Effort: Pulmonary effort is normal.     Breath sounds: Normal breath sounds.  Abdominal:     Palpations: Abdomen is soft. There is no hepatomegaly, splenomegaly or mass.     Tenderness: There is no abdominal tenderness.  Lymphadenopathy:     Upper Body:     Right upper body: No supraclavicular, axillary or pectoral adenopathy.     Left upper body: No supraclavicular, axillary or pectoral adenopathy.  Neurological:     General: No focal deficit present.     Mental Status: She is alert and oriented to person, place, and time.  Psychiatric:        Mood and Affect: Mood normal.        Behavior:  Behavior normal.      LABORATORY DATA:  I have reviewed the labs as listed.  CBC    Component Value Date/Time   WBC  4.6 10/31/2022 0810   RBC 3.46 (L) 10/31/2022 0810   HGB 10.1 (L) 10/31/2022 0810   HCT 31.3 (L) 10/31/2022 0810   PLT 219 10/31/2022 0810   MCV 90.5 10/31/2022 0810   MCH 29.2 10/31/2022 0810   MCHC 32.3 10/31/2022 0810   RDW 16.2 (H) 10/31/2022 0810   LYMPHSABS 0.3 (L) 10/31/2022 0810   MONOABS 0.3 10/31/2022 0810   EOSABS 0.0 10/31/2022 0810   BASOSABS 0.0 10/31/2022 0810      Latest Ref Rng & Units 10/31/2022    8:10 AM 10/20/2022    9:00 AM 10/18/2022   11:53 AM  CMP  Glucose 70 - 99 mg/dL 111  116  121   BUN 8 - 23 mg/dL '12  7  9   '$ Creatinine 0.44 - 1.00 mg/dL 0.65  0.68  0.77   Sodium 135 - 145 mmol/L 137  137  136   Potassium 3.5 - 5.1 mmol/L 4.1  3.5  4.2   Chloride 98 - 111 mmol/L 108  105  99   CO2 22 - 32 mmol/L '21  21  22   '$ Calcium 8.9 - 10.3 mg/dL 8.6  8.5  9.4   Total Protein 6.5 - 8.1 g/dL 6.2  6.5  7.3   Total Bilirubin 0.3 - 1.2 mg/dL 0.4  0.5  0.8   Alkaline Phos 38 - 126 U/L 61  64  78   AST 15 - 41 U/L 50  44  50   ALT 0 - 44 U/L 35  41  56     DIAGNOSTIC IMAGING:  I have independently reviewed the scans and discussed with the patient. DG Chest 2 View  Result Date: 10/18/2022 CLINICAL DATA:  Fever. EXAM: CHEST - 2 VIEW COMPARISON:  September 21, 2022.  October 06, 2022. FINDINGS: The heart size and mediastinal contours are within normal limits. Right internal jugular Port-A-Cath is unchanged. Left upper lobe density is noted laterally which most likely corresponds to abnormality seen on prior PET CT scan. The visualized skeletal structures are unremarkable. IMPRESSION: Left upper lobe density is noted laterally which most likely corresponds to abnormality seen on prior PET CT scan. It does not appear to be significantly changed compared to scout image of PET scan. It is uncertain if this represents inflammation or neoplasm. Electronically Signed   By: Marijo Conception M.D.   On: 10/18/2022 12:15   NM PET Image Restag (PS) Skull Base To Thigh  Result Date:  10/07/2022 CLINICAL DATA:  Subsequent treatment strategy for diffuse large B-cell lymphoma. EXAM: NUCLEAR MEDICINE PET SKULL BASE TO THIGH TECHNIQUE: 8.15 mCi F-18 FDG was injected intravenously. Full-ring PET imaging was performed from the skull base to thigh after the radiotracer. CT data was obtained and used for attenuation correction and anatomic localization. Fasting blood glucose: 128 mg/dl COMPARISON:  PET-CT 07/07/2022 FINDINGS: Mediastinal blood pool activity: SUV max 1.4 Liver activity: SUV max 2.0 NECK: There are no residual or recurrent hypermetabolic cervical lymph nodes. Previously identified hypermetabolic level 2 node on the left is significantly smaller, without residual hypermetabolic activity. This node measures 6 mm on image 44/3 and has an SUV max of 1.2 (previously 2.0 cm, SUV max 34.4). No suspicious activity identified within the pharyngeal mucosal space. Incidental CT findings: Postsurgical changes in the left supraclavicular area with  a decreasing postoperative fluid collection. No residual hypermetabolic activity. CHEST: No residual or recurrent mediastinal, hilar or axillary lymphadenopathy. There are new nodular hypermetabolic airspace opacities within the left upper lobe, including a component measuring 3.2 x 1.3 cm on image 31/7 which has an SUV max of 3.9 and a lingular component measuring 2.4 x 1.7 cm on image 42/7 which has an SUV max of 3.8. There is a single nodular hypermetabolic focus in the right lung near the minor fissure measuring 9 mm on image 36/7 which has an SUV max of 1.5. There were no corresponding abnormalities in these areas on previous PET-CT, and these most likely reflect inflammation or infection. Incidental CT findings: Right IJ Port-A-Cath extends to the superior cavoatrial junction. Mild underlying centrilobular emphysema. Stable linear scarring at both lung bases. ABDOMEN/PELVIS: There is no hypermetabolic activity within the liver, adrenal glands, spleen or  pancreas. There are no residual or recurrent enlarged or hypermetabolic lymph nodes in the abdomen or pelvis. Postsurgical changes in the right inguinal region. Remaining small pelvic and inguinal lymph nodes are normal in size without hypermetabolic activity. For example, the largest remaining left external iliac node has a short axis dimension of 6 mm on image 223/3 and has an SUV max of 0.9. Incidental CT findings: As above, postsurgical changes in the right groin with a small postoperative fluid collection. No associated hypermetabolic activity. Mild aortic and branch vessel atherosclerosis. SKELETON: There is no hypermetabolic activity to suggest osseous metastatic disease. Incidental CT findings: Mild lumbar spondylosis. IMPRESSION: 1. Interval resolution of previously demonstrated hypermetabolic cervical, thoracic and pelvic lymphadenopathy consistent with response to therapy. No residual hypermetabolic lymph nodes are identified. [place] 2. 2. New nodular hypermetabolic airspace opacities in the left upper lobe and right mid lung, likely inflammatory/infectious. These are visible on the scout image. Chest radiographic follow-up recommended to ensure resolution and exclude atypical progression of the patient's lymphoma. 3. No definite findings suspicious for progressive lymphoma. 4. Aortic Atherosclerosis (ICD10-I70.0) and Emphysema (ICD10-J43.9). 5. These results will be called to the ordering clinician or representative by the Radiologist Assistant, and communication documented in the PACS or [employer]. Electronically Signed   By: Richardean Sale M.D.   On: 10/07/2022 09:22    ASSESSMENT:  1.  Stage III DLBCL: - CT soft tissue neck (06/06/2022): Left supraclavicular mass 1.9 x 1.6 cm central hypodensity.  Smaller adjacent nodule present.  Oval-shaped mass anterior to the left IJ vein 2.9 x 1.8 cm.  No definite 2 exophytic oropharyngeal or hypopharyngeal mass noted.  Thyroid intact.  Lung bases  clear. - Left supraclavicular lymph node biopsy by Dr. Ladona Horns on 06/10/2022 - Pathology: Atypical B-cell lymphoid infiltrate highly concerning for a lymphoproliferative disorder, possibly high-grade in the background of extensive granulomatous type inflammation and necrosis. - 2D echo (06/28/2022): LVEF 60-65%. - PET scan (07/07/2022): Markedly hypermetabolic adenopathy in the neck, chest, pelvis and inguinal regions.  Few subcentimeter pulmonary nodules are too small for PET resolution.  Hepatic steatosis. - Right inguinal lymph node biopsy (07/20/2022): Large B-cell lymphoma with Ki-67 70%.  FISH studies for high risk lymphoma sent. - IPI score-3 (age more than 67, stage III, elevated LDH) - 2D echo (06/28/2022): EF 60 to 65%. - Cycle 1 Pola-R-CHP on 08/01/2022   2.  Social/family history: - She lives at home with her husband.  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  Non-smoker.  No history of exposure to chemicals or pesticides. - Mother died of pancreatic cancer.  Father  had prostate cancer.  Sister had breast cancer at age 43.  Brother had kidney cancer at age 41.    PLAN:  1.  Stage III DLBCL: - She had 1 ER visit after cycle 4 on 10/10/2022 with fever. - She completed antibiotics and did not have any further fevers. - Overall she has done fairly well after cycle 4. - Reviewed labs today which showed AST of 50 and rest of LFTs are normal.  CBC was grossly normal. - Proceed with cycle 5 today with 20% dose reduction.  RTC 3 weeks for follow-up.  2.  Acid reflux: - Continue Protonix 40 mg daily.  3.  Nausea: - Continue Zofran as needed.  5.  Mouth ulcers: - Use lidocaine and Carafate as needed.    Orders placed this encounter:  No orders of the defined types were placed in this encounter.     I,Alexis Herring,acting as a Education administrator for [employer], MD.,have documented all relevant documentation on the behalf of Derek Jack, MD,as directed by  Derek Jack, MD while in the presence of Derek Jack, MD.  I, Derek Jack MD, have reviewed the above documentation for accuracy and completeness, and I agree with the above.    Derek Jack, MD [place] [phone]

## 2022-10-31 NOTE — Progress Notes (Signed)
Patient has been assessed, vital signs and labs have been reviewed by Dr. Katragadda. ANC, Creatinine, LFTs, and Platelets are within treatment parameters per Dr. Katragadda. The patient is good to proceed with treatment at this time. Primary RN and pharmacy aware.  

## 2022-10-31 NOTE — Progress Notes (Signed)
Nutrition Assessment   Reason for Assessment: +MST   ASSESSMENT: 62 year old female with diffuse large B-cell lymphoma. She is receiving Pola-R-CHP q21d (start 07/30/22). Patient is under the care of Dr. Delton Coombes.   Past medical history includes HTN, IDDM2, mucositis, COVID-19   12/27-12/29 - admission with COVID-19 virus infection  Met with patient in infusion. Daughter is present today for visit. Patient reports appetite is not great and forces meals most days. Typically she eats twice daily. Sometimes she will snack. Patient endorses low carb diet for management of DM. Due to poor appetite, recently allowing herself more carbohydrates to provide variety. Yesterday she had a peanut butter sandwich and personal pizza (blk olives, mushrooms). She drinks "mostly" water. Patient reports nausea that starts the day after treatment. Zofran works well for her. Patient denies constipation or diarrhea. Patient reports she would like to be more active once she has completed treatment.    Nutrition Focused Physical Exam: deferred    Medications: xanax, klonopin, lexapro, insulin, zofran, protonix, klor-con, carafate, phenergan   Labs: Ca 8.6, glucose 111   Anthropometrics: Pt endorses weight loss secondary to Covid + mucositis in December. This is improving   Height: 5'5" Weight: 166 lb 3.2 oz UBW: 172 lb (09/12/22) BMI: 27.66    NUTRITION DIAGNOSIS: Unintentional weight loss secondary to acute illness (Covid + mucositis) as evidenced by 3.5% decrease from usual weight in 6 weeks - insignificant for time frame.    INTERVENTION:  Educated on blood glucose management Encouraged small frequent meals and snacks with focus on protein - handout with ideas provided  Discussed ways to have balanced meals and snacks  Encouraged glucerna/equivalent twice daily - samples provided  Discussed strategies for nausea, foods to limit and foods best tolerated - handout with tips provided Contact  information given    MONITORING, EVALUATION, GOAL: Pt will tolerate increased calories and protein to minimize further weight loss during treatment   Next Visit: To be scheduled as needed with treatment

## 2022-10-31 NOTE — Patient Instructions (Signed)
[facility]  Discharge Instructions: Thank you for choosing [facility] to provide your oncology and hematology care.  If you have a lab appointment with the [facility], please come in thru the Main Entrance and check in at the main information desk.  Wear comfortable clothing and clothing appropriate for easy access to any Portacath or PICC line.   We strive to give you quality time with your provider. You may need to reschedule your appointment if you arrive late (15 or more minutes).  Arriving late affects you and other patients whose appointments are after yours.  Also, if you miss three or more appointments without notifying the office, you may be dismissed from the clinic at the provider's discretion.      For prescription refill requests, have your pharmacy contact our office and allow 72 hours for refills to be completed.    Today you received the following chemotherapy and/or immunotherapy agents POLA-R-CHP   To help prevent nausea and vomiting after your treatment, we encourage you to take your nausea medication as directed.  BELOW ARE SYMPTOMS THAT SHOULD BE REPORTED IMMEDIATELY: *FEVER GREATER THAN 100.4 F (38 C) OR HIGHER *CHILLS OR SWEATING *NAUSEA AND VOMITING THAT IS NOT CONTROLLED WITH YOUR NAUSEA MEDICATION *UNUSUAL SHORTNESS OF BREATH *UNUSUAL BRUISING OR BLEEDING *URINARY PROBLEMS (pain or burning when urinating, or frequent urination) *BOWEL PROBLEMS (unusual diarrhea, constipation, pain near the anus) TENDERNESS IN MOUTH AND THROAT WITH OR WITHOUT PRESENCE OF ULCERS (sore throat, sores in mouth, or a toothache) UNUSUAL RASH, SWELLING OR PAIN  UNUSUAL VAGINAL DISCHARGE OR ITCHING   Items with * indicate a potential emergency and should be followed up as soon as possible or go to the Emergency Department if any problems should occur.  Please show the CHEMOTHERAPY ALERT CARD or IMMUNOTHERAPY ALERT CARD at check-in to the Emergency  Department and triage nurse.  Should you have questions after your visit or need to cancel or reschedule your appointment, please contact [facility] [phone]  and follow the prompts.  Office hours are 8:00 a.m. to 4:30 p.m. Monday - Friday. Please note that voicemails left after 4:00 p.m. may not be returned until the following business day.  We are closed weekends and major holidays. You have access to a nurse at all times for urgent questions. Please call the main number to the clinic [phone] and follow the prompts.  For any non-urgent questions, you may also contact your provider using MyChart. We now offer e-Visits for anyone 34 and older to request care online for non-urgent symptoms. For details visit [email or web address].   Also download the MyChart app! Go to the app store, search "MyChart", open the app, select [facility], and log in with your MyChart username and password.

## 2022-10-31 NOTE — Patient Instructions (Signed)
[place]  Discharge Instructions  You were seen and examined today by Dr. Delton Coombes.  Proceed with treatment as planned.  Follow-up as scheduled.  Thank you for choosing [facility] to provide your oncology and hematology care.   To afford each patient quality time with our provider, please arrive at least 15 minutes before your scheduled appointment time. You may need to reschedule your appointment if you arrive late (10 or more minutes). Arriving late affects you and other patients whose appointments are after yours.  Also, if you miss three or more appointments without notifying the office, you may be dismissed from the clinic at the provider's discretion.    Again, thank you for choosing [facility].  Our hope is that these requests will decrease the amount of time that you wait before being seen by our physicians.   If you have a lab appointment with the [facility] please come in thru the Main Entrance and check in at the main information desk.           _____________________________________________________________  Should you have questions after your visit to [facility], please contact our office at [phone] and follow the prompts.  Our office hours are 8:00 a.m. to 4:30 p.m. Monday - Thursday and 8:00 a.m. to 2:30 p.m. Friday.  Please note that voicemails left after 4:00 p.m. may not be returned until the following business day.  We are closed weekends and all major holidays.  You do have access to a nurse 24-7, just call the main number to the clinic [phone] and do not press any options, hold on the line and a nurse will answer the phone.    For prescription refill requests, have your pharmacy contact our office and allow 72 hours.    Masks are optional in the cancer centers. If you would like for your care team to wear a mask while they are taking care of you, please let them know.  You may have one support person who is at least 64 years old accompany you for your appointments.

## 2022-10-31 NOTE — Progress Notes (Signed)
Pt presents today for Rituximab, Polivy, Doxorubicin, and Cytoxan per provider's order. Vital signs and labs WNL for treatment. Okay to proceed with treatment today per Dr.K.  Pt will get 80% dose of Adriamycin and Cytoxan as she did last time per Dr.K.  Treatment given today per MD orders. Tolerated infusion without adverse affects.Neulasta OnPro applied the left arm. Vital signs stable. No complaints at this time. Discharged from clinic ambulatory in stable condition. Alert and oriented x 3. F/U with [facility] as scheduled.

## 2022-11-11 ENCOUNTER — Encounter: Payer: Self-pay | Admitting: Hematology

## 2022-11-14 ENCOUNTER — Other Ambulatory Visit: Payer: Managed Care, Other (non HMO)

## 2022-11-14 ENCOUNTER — Ambulatory Visit: Payer: Managed Care, Other (non HMO)

## 2022-11-14 ENCOUNTER — Ambulatory Visit: Payer: Managed Care, Other (non HMO) | Admitting: Hematology

## 2022-11-21 ENCOUNTER — Inpatient Hospital Stay: Payer: Managed Care, Other (non HMO) | Admitting: Hematology

## 2022-11-21 ENCOUNTER — Inpatient Hospital Stay: Payer: Managed Care, Other (non HMO)

## 2022-11-21 VITALS — BP 125/71 | HR 82 | Temp 97.6°F | Resp 20 | Wt 169.0 lb

## 2022-11-21 VITALS — BP 155/82 | HR 74 | Temp 97.7°F | Resp 18

## 2022-11-21 DIAGNOSIS — Z95828 Presence of other vascular implants and grafts: Secondary | ICD-10-CM

## 2022-11-21 DIAGNOSIS — C8338 Diffuse large B-cell lymphoma, lymph nodes of multiple sites: Secondary | ICD-10-CM

## 2022-11-21 DIAGNOSIS — Z5112 Encounter for antineoplastic immunotherapy: Secondary | ICD-10-CM | POA: Diagnosis not present

## 2022-11-21 DIAGNOSIS — C8590 Non-Hodgkin lymphoma, unspecified, unspecified site: Secondary | ICD-10-CM

## 2022-11-21 LAB — CBC WITH DIFFERENTIAL/PLATELET
Abs Immature Granulocytes: 0.06 10*3/uL (ref 0.00–0.07)
Basophils Absolute: 0.1 10*3/uL (ref 0.0–0.1)
Basophils Relative: 2 %
Eosinophils Absolute: 0 10*3/uL (ref 0.0–0.5)
Eosinophils Relative: 1 %
HCT: 30.4 % — ABNORMAL LOW (ref 36.0–46.0)
Hemoglobin: 9.6 g/dL — ABNORMAL LOW (ref 12.0–15.0)
Immature Granulocytes: 2 %
Lymphocytes Relative: 7 %
Lymphs Abs: 0.2 10*3/uL — ABNORMAL LOW (ref 0.7–4.0)
MCH: 29.7 pg (ref 26.0–34.0)
MCHC: 31.6 g/dL (ref 30.0–36.0)
MCV: 94.1 fL (ref 80.0–100.0)
Monocytes Absolute: 0.5 10*3/uL (ref 0.1–1.0)
Monocytes Relative: 15 %
Neutro Abs: 2.5 10*3/uL (ref 1.7–7.7)
Neutrophils Relative %: 73 %
Platelets: 193 10*3/uL (ref 150–400)
RBC: 3.23 MIL/uL — ABNORMAL LOW (ref 3.87–5.11)
RDW: 16.2 % — ABNORMAL HIGH (ref 11.5–15.5)
WBC: 3.5 10*3/uL — ABNORMAL LOW (ref 4.0–10.5)
nRBC: 0 % (ref 0.0–0.2)

## 2022-11-21 LAB — COMPREHENSIVE METABOLIC PANEL
ALT: 29 U/L (ref 0–44)
AST: 39 U/L (ref 15–41)
Albumin: 3.5 g/dL (ref 3.5–5.0)
Alkaline Phosphatase: 74 U/L (ref 38–126)
Anion gap: 8 (ref 5–15)
BUN: 15 mg/dL (ref 8–23)
CO2: 22 mmol/L (ref 22–32)
Calcium: 8.3 mg/dL — ABNORMAL LOW (ref 8.9–10.3)
Chloride: 107 mmol/L (ref 98–111)
Creatinine, Ser: 0.59 mg/dL (ref 0.44–1.00)
GFR, Estimated: >60 mL/min (ref >60)
Glucose, Bld: 127 mg/dL — ABNORMAL HIGH (ref 70–99)
Potassium: 4 mmol/L (ref 3.5–5.1)
Sodium: 137 mmol/L (ref 135–145)
Total Bilirubin: 0.5 mg/dL (ref 0.3–1.2)
Total Protein: 6 g/dL — ABNORMAL LOW (ref 6.5–8.1)

## 2022-11-21 LAB — MAGNESIUM: Magnesium: 1.9 mg/dL (ref 1.7–2.4)

## 2022-11-21 MED ORDER — PALONOSETRON HCL INJECTION 0.25 MG/5ML
0.2500 mg | Freq: Once | INTRAVENOUS | Status: AC
  Administered 2022-11-21: 0.25 mg via INTRAVENOUS
  Filled 2022-11-21: qty 5

## 2022-11-21 MED ORDER — FAMOTIDINE IN NACL 20-0.9 MG/50ML-% IV SOLN
20.0000 mg | Freq: Once | INTRAVENOUS | Status: AC
  Administered 2022-11-21: 20 mg via INTRAVENOUS
  Filled 2022-11-21: qty 50

## 2022-11-21 MED ORDER — SODIUM CHLORIDE 0.9 % IV SOLN
10.0000 mg | Freq: Once | INTRAVENOUS | Status: AC
  Administered 2022-11-21: 10 mg via INTRAVENOUS
  Filled 2022-11-21: qty 10

## 2022-11-21 MED ORDER — ALTEPLASE 2 MG IJ SOLR: 2.0000 mg | Freq: Once | INTRAMUSCULAR | Status: DC | PRN

## 2022-11-21 MED ORDER — ACETAMINOPHEN 325 MG PO TABS
650.0000 mg | ORAL_TABLET | Freq: Once | ORAL | Status: AC
  Administered 2022-11-21: 650 mg via ORAL
  Filled 2022-11-21: qty 2

## 2022-11-21 MED ORDER — DOXORUBICIN HCL CHEMO IV INJECTION 2 MG/ML
40.0000 mg/m2 | Freq: Once | INTRAVENOUS | Status: AC
  Administered 2022-11-21: 76 mg via INTRAVENOUS
  Filled 2022-11-21: qty 38

## 2022-11-21 MED ORDER — SODIUM CHLORIDE 0.9 % IV SOLN
150.0000 mg | Freq: Once | INTRAVENOUS | Status: AC
  Administered 2022-11-21: 150 mg via INTRAVENOUS
  Filled 2022-11-21: qty 150

## 2022-11-21 MED ORDER — SODIUM CHLORIDE 0.9 % IV SOLN
1.8200 mg/kg | Freq: Once | INTRAVENOUS | Status: AC
  Administered 2022-11-21: 140 mg via INTRAVENOUS
  Filled 2022-11-21: qty 7

## 2022-11-21 MED ORDER — PEGFILGRASTIM 6 MG/0.6ML ~~LOC~~ PSKT
6.0000 mg | PREFILLED_SYRINGE | Freq: Once | SUBCUTANEOUS | Status: AC
  Administered 2022-11-21: 6 mg via SUBCUTANEOUS
  Filled 2022-11-21: qty 0.6

## 2022-11-21 MED ORDER — SODIUM CHLORIDE 0.9 % IV SOLN
600.0000 mg/m2 | Freq: Once | INTRAVENOUS | Status: AC
  Administered 2022-11-21: 1120 mg via INTRAVENOUS
  Filled 2022-11-21: qty 56

## 2022-11-21 MED ORDER — SODIUM CHLORIDE 0.9 % IV SOLN: Freq: Once | INTRAVENOUS | Status: AC

## 2022-11-21 MED ORDER — HEPARIN SOD (PORK) LOCK FLUSH 100 UNIT/ML IV SOLN: 250.0000 [IU] | Freq: Once | INTRAVENOUS | Status: DC | PRN

## 2022-11-21 MED ORDER — CETIRIZINE HCL 10 MG/ML IV SOLN
10.0000 mg | Freq: Once | INTRAVENOUS | Status: AC
  Administered 2022-11-21: 10 mg via INTRAVENOUS
  Filled 2022-11-21: qty 1

## 2022-11-21 MED ORDER — SODIUM CHLORIDE 0.9% FLUSH
10.0000 mL | Freq: Once | INTRAVENOUS | Status: AC
  Administered 2022-11-21: 10 mL via INTRAVENOUS

## 2022-11-21 MED ORDER — SODIUM CHLORIDE 0.9 % IV SOLN
375.0000 mg/m2 | Freq: Once | INTRAVENOUS | Status: AC
  Administered 2022-11-21: 700 mg via INTRAVENOUS
  Filled 2022-11-21: qty 50

## 2022-11-21 MED ORDER — SODIUM CHLORIDE 0.9% FLUSH
10.0000 mL | INTRAVENOUS | Status: DC | PRN
  Administered 2022-11-21: 10 mL

## 2022-11-21 MED ORDER — HEPARIN SOD (PORK) LOCK FLUSH 100 UNIT/ML IV SOLN
500.0000 [IU] | Freq: Once | INTRAVENOUS | Status: AC | PRN
  Administered 2022-11-21: 500 [IU]

## 2022-11-21 MED ORDER — PREDNISONE 50 MG PO TABS
100.0000 mg | ORAL_TABLET | Freq: Once | ORAL | Status: AC
  Administered 2022-11-21: 100 mg via ORAL
  Filled 2022-11-21: qty 2

## 2022-11-21 MED ORDER — SODIUM CHLORIDE 0.9% FLUSH: 3.0000 mL | INTRAVENOUS | Status: DC | PRN

## 2022-11-21 NOTE — Patient Instructions (Addendum)
[place] at [facility] Discharge Instructions   You were seen and examined today by Dr. Delton Coombes.  He reviewed the results of your lab work which are normal/stable.   We will proceed with your last treatment today.   We will repeat a PET scan prior to your next office visit.   Return as scheduled.    Thank you for choosing [place] at [facility] to provide your oncology and hematology care.  To afford each patient quality time with our provider, please arrive at least 15 minutes before your scheduled appointment time.   If you have a lab appointment with the [facility] please come in thru the Main Entrance and check in at the main information desk.  You need to re-schedule your appointment should you arrive 10 or more minutes late.  We strive to give you quality time with our providers, and arriving late affects you and other patients whose appointments are after yours.  Also, if you no show three or more times for appointments you may be dismissed from the clinic at the providers discretion.     Again, thank you for choosing [facility].  Our hope is that these requests will decrease the amount of time that you wait before being seen by our physicians.       _____________________________________________________________  Should you have questions after your visit to [facility], please contact our office at [phone] and follow the prompts.  Our office hours are 8:00 a.m. and 4:30 p.m. Monday - Friday.  Please note that voicemails left after 4:00 p.m. may not be returned until the following business day.  We are closed weekends and major holidays.  You do have access to a nurse 24-7, just call the main number to the clinic [phone] and do not press any options, hold on the line and a nurse will answer the phone.    For prescription refill requests, have your pharmacy contact our office and allow 72  hours.    Due to Covid, you will need to wear a mask upon entering the hospital. If you do not have a mask, a mask will be given to you at the Main Entrance upon arrival. For doctor visits, patients may have 1 support person age 94 or older with them. For treatment visits, patients can not have anyone with them due to social distancing guidelines and our immunocompromised population.

## 2022-11-21 NOTE — Progress Notes (Signed)
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  PRIOR THERAPY: None  NGS Results: FISH results pending  CURRENT THERAPY: Pola-R-CHP  BRIEF ONCOLOGIC HISTORY:  Oncology History  Diffuse large B cell lymphoma ([place])  07/27/2022 Initial Diagnosis   Diffuse large B cell lymphoma ([place])   07/27/2022 Cancer Staging   Staging form: Hodgkin and Non-Hodgkin Lymphoma, AJCC 8th Edition - Clinical stage from 07/27/2022: Stage III (Diffuse large B-cell lymphoma) - Signed by Derek Jack, MD on 07/27/2022 Histopathologic type: Malignant lymphoma, large B-cell, diffuse, NOS Stage prefix: Initial diagnosis   08/01/2022 -  Chemotherapy   Patient is on Treatment Plan : NON-HODGKINS LYMPHOMA Pola-R-CHP (Polatuzumab + Rituximab -CHP) q21d       CANCER STAGING:  Cancer Staging  Diffuse large B cell lymphoma ([place]) Staging form: Hodgkin and Non-Hodgkin Lymphoma, AJCC 8th Edition - Clinical stage from 07/27/2022: Stage III (Diffuse large B-cell lymphoma) - Signed by Derek Jack, MD on 07/27/2022   INTERVAL HISTORY:  Debbie Hall 62 y.o. female seen for follow-up of large B-cell lymphoma on tox [place] prior to next cycle of chemotherapy.  She forgot taking prednisone this morning.  She has not had any infections in the last 3 weeks.  Denied any ER visits or urgent care visits.  Chronic cough has been stable.  Intermittent nausea is helped with Zofran.  Denied any vomiting.  Energy levels are about 50%.  REVIEW OF SYSTEMS:  Review of Systems  Constitutional:  Negative for chills, fatigue and fever.  HENT:   Negative for lump/mass, mouth sores, nosebleeds, sore throat and trouble swallowing.   Eyes:  Negative for eye problems.  Respiratory:  Positive for cough.    Cardiovascular:  Negative for chest pain, leg swelling and palpitations.  Gastrointestinal:  Positive for nausea. Negative for abdominal pain, constipation, diarrhea and vomiting.  Genitourinary:  Negative for bladder incontinence, difficulty urinating, dysuria, frequency, hematuria and nocturia.   Musculoskeletal:  Negative for arthralgias, back pain, flank pain, myalgias and neck pain.  Skin:  Negative for itching and rash.  Neurological:  Negative for dizziness, headaches and numbness.  Hematological:  Does not bruise/bleed easily.  Psychiatric/Behavioral:  Positive for sleep disturbance. Negative for depression and suicidal ideas. The patient is nervous/anxious.   All other systems reviewed and are negative.    PAST MEDICAL/SURGICAL HISTORY:  Past Medical History:  Diagnosis Date   Anemia    Anxiety    Diabetes mellitus ([place])    Fatty liver    GERD (gastroesophageal reflux disease)    Gout    Hyperlipidemia    Hypertension    Lymphoma ([place])    Migraines    Pneumonia    as a child   Port-A-Cath in place 07/28/2022   Past Surgical History:  Procedure Laterality Date   ABDOMINAL HYSTERECTOMY     partial   CESAREAN SECTION     x 2   TONSILLECTOMY       SOCIAL HISTORY:  Social History   Socioeconomic History   Marital status: Married    Spouse name: Not on file   Number of children: 4   Years of education: Not on file   Highest education level: Not on file  Occupational History   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    Comment:  [employer]  Tobacco Use   Smoking status: Never   Smokeless tobacco: Never  Vaping Use   Vaping Use: Never used  Substance and Sexual Activity   Alcohol use: Yes    Comment: occasional   Drug use: Never   Sexual activity: Not on file  Other Topics Concern   Not on file  Social History Narrative   Not on file   Social Determinants of Health   Financial Resource Strain: Not on file  Food Insecurity: No Food  Insecurity (09/21/2022)   Hunger Vital Sign    Worried About Running Out of Food in the Last Year: Never true    Ran Out of Food in the Last Year: Never true  Transportation Needs: No Transportation Needs (09/21/2022)   PRAPARE - Hydrologist (Medical): No    Lack of Transportation (Non-Medical): No  Physical Activity: Not on file  Stress: Not on file  Social Connections: Not on file  Intimate Partner Violence: Not At Risk (09/21/2022)   Humiliation, Afraid, Rape, and Kick questionnaire    Fear of Current or Ex-Partner: No    Emotionally Abused: No    Physically Abused: No    Sexually Abused: No    FAMILY HISTORY:  Family History  Problem Relation Age of Onset   Hypertension Mother    Heart disease Mother    Heart attack Mother    Pancreatic cancer Mother    Prostate cancer Father    Colon cancer Neg Hx    Esophageal cancer Neg Hx    Rectal cancer Neg Hx     CURRENT MEDICATIONS:   Current Outpatient Medications:    allopurinol (ZYLOPRIM) 300 MG tablet, Take 1 tablet (300 mg total) by mouth daily., Disp: 30 tablet, Rfl: 3   ALPRAZolam (XANAX) 1 MG tablet, Take 1 mg by mouth 3 (three) times daily as needed for anxiety or sleep., Disp: , Rfl:    CYCLOPHOSPHAMIDE IV, Inject into the vein every 21 ( twenty-one) days., Disp: , Rfl:    DOXORUBICIN HCL IV, Inject into the vein every 21 ( twenty-one) days., Disp: , Rfl:    escitalopram (LEXAPRO) 5 MG tablet, Take 1 tablet by mouth daily., Disp: , Rfl:    guaiFENesin-dextromethorphan (ROBITUSSIN DM) 100-10 MG/5ML syrup, Take 5 mLs by mouth every 4 (four) hours as needed for cough., Disp: 118 mL, Rfl: 0   ibuprofen (ADVIL) 200 MG tablet, Take 600 mg by mouth every 6 (six) hours as needed for moderate pain., Disp: , Rfl:    Insulin Glargine w/ Trans Port 100 UNIT/ML SOPN, Inject 10 Units into the skin daily., Disp: , Rfl:    Insulin Lispro (HUMALOG KWIKPEN [place]), Inject 2-10 Units into the skin daily as needed  (blood sugar). Sliding scale, Disp: , Rfl:    lidocaine (XYLOCAINE) 2 % solution, Mix 10 ml with 10 ml of Carafate an swish and swallow 4 times daily, Disp: 200 mL, Rfl: 1   morphine 10 MG/5ML solution, Take 2.5 mLs (5 mg total) by mouth every 2 (two) hours as needed for severe pain. Dilute in 1tsp of water and swish, then spit, Disp: 100 mL, Rfl: 0   pantoprazole (PROTONIX) 40 MG tablet, Take 1 tablet (40 mg total) by mouth daily., Disp: 30 tablet, Rfl: 4   Polatuzumab Vedotin-piiq (POLIVY IV), Inject into the vein every 21 ( twenty-one) days., Disp: , Rfl:    predniSONE (DELTASONE) 20 MG tablet, Take 100 mg (5  tablets) by mouth daily with food on days 1-5 of chemotherapy, Disp: 25 tablet, Rfl: 5   RITUXIMAB IV, Inject into the vein every 21 ( twenty-one) days., Disp: , Rfl:    sucralfate (CARAFATE) 1 GM/10ML suspension, Mix 10 ml with 10 ml of lidocaine and swish and swallow 4 times daily, Disp: 420 mL, Rfl: 1   lidocaine-prilocaine (EMLA) cream, Apply 1 Application topically as needed (Local anesthesia for port). (Patient not taking: Reported on 11/21/2022), Disp: 30 g, Rfl: 6   ondansetron (ZOFRAN) 4 MG tablet, Take 1 tablet (4 mg total) by mouth every 8 (eight) hours as needed for nausea or vomiting. (Patient not taking: Reported on 11/21/2022), Disp: 60 tablet, Rfl: 3   potassium chloride SA (KLOR-CON M) 20 MEQ tablet, Take 1 tablet (20 mEq total) by mouth daily for 5 days. (Patient not taking: Reported on 10/18/2022), Disp: 5 tablet, Rfl: 0   promethazine (PHENERGAN) 25 MG tablet, Take 1 tablet (25 mg total) by mouth every 6 (six) hours as needed for nausea or vomiting. (Patient not taking: Reported on 11/21/2022), Disp: 30 tablet, Rfl: 4    ALLERGIES:  Allergies  Allergen Reactions   Avocado Itching   Banana Itching   Latex Itching    Eyes watering, itching.   Lisinopril     angioedema   Penicillins     Facial swelling   Polivy [Polatuzumab Vedotin] Other (See Comments)    Dry throat  and trouble catching her breath.   Rituxan [Rituximab]     Itching in her throat    Vancomycin Itching     PHYSICAL EXAM:  ECOG Performance status: 0  Vitals:   11/21/22 0811  BP: 125/71  Pulse: 82  Resp: 20  Temp: 97.6 F (36.4 C)  SpO2: 99%    Filed Weights   11/21/22 0811  Weight: 169 lb (76.7 kg)     Physical Exam Vitals reviewed. Exam conducted with a chaperone present.  Constitutional:      Appearance: Normal appearance.  Cardiovascular:     Rate and Rhythm: Normal rate and regular rhythm.     Pulses: Normal pulses.     Heart sounds: Normal heart sounds.  Pulmonary:     Effort: Pulmonary effort is normal.     Breath sounds: Normal breath sounds.  Abdominal:     Palpations: Abdomen is soft. There is no hepatomegaly, splenomegaly or mass.     Tenderness: There is no abdominal tenderness.  Lymphadenopathy:     Upper Body:     Right upper body: No supraclavicular, axillary or pectoral adenopathy.     Left upper body: No supraclavicular, axillary or pectoral adenopathy.  Neurological:     General: No focal deficit present.     Mental Status: She is alert and oriented to person, place, and time.  Psychiatric:        Mood and Affect: Mood normal.        Behavior: Behavior normal.      LABORATORY DATA:  I have reviewed the labs as listed.  CBC    Component Value Date/Time   WBC 3.5 (L) 11/21/2022 0803   RBC 3.23 (L) 11/21/2022 0803   HGB 9.6 (L) 11/21/2022 0803   HCT 30.4 (L) 11/21/2022 0803   PLT 193 11/21/2022 0803   MCV 94.1 11/21/2022 0803   MCH 29.7 11/21/2022 0803   MCHC 31.6 11/21/2022 0803   RDW 16.2 (H) 11/21/2022 0803   LYMPHSABS 0.2 (L) 11/21/2022 0803   MONOABS 0.5  11/21/2022 0803   EOSABS 0.0 11/21/2022 0803   BASOSABS 0.1 11/21/2022 0803      Latest Ref Rng & Units 11/21/2022    8:03 AM 10/31/2022    8:10 AM 10/20/2022    9:00 AM  CMP  Glucose 70 - 99 mg/dL 127  111  116   BUN 8 - 23 mg/dL '15  12  7   '$ Creatinine 0.44 - 1.00  mg/dL 0.59  0.65  0.68   Sodium 135 - 145 mmol/L 137  137  137   Potassium 3.5 - 5.1 mmol/L 4.0  4.1  3.5   Chloride 98 - 111 mmol/L 107  108  105   CO2 22 - 32 mmol/L '22  21  21   '$ Calcium 8.9 - 10.3 mg/dL 8.3  8.6  8.5   Total Protein 6.5 - 8.1 g/dL 6.0  6.2  6.5   Total Bilirubin 0.3 - 1.2 mg/dL 0.5  0.4  0.5   Alkaline Phos 38 - 126 U/L 74  61  64   AST 15 - 41 U/L 39  50  44   ALT 0 - 44 U/L 29  35  41     DIAGNOSTIC IMAGING:  I have independently reviewed the scans and discussed with the patient. No results found.  ASSESSMENT:  1.  Stage III DLBCL: - CT soft tissue neck (06/06/2022): Left supraclavicular mass 1.9 x 1.6 cm central hypodensity.  Smaller adjacent nodule present.  Oval-shaped mass anterior to the left IJ vein 2.9 x 1.8 cm.  No definite 2 exophytic oropharyngeal or hypopharyngeal mass noted.  Thyroid intact.  Lung bases clear. - Left supraclavicular lymph node biopsy by Dr. Ladona Horns on 06/10/2022 - Pathology: Atypical B-cell lymphoid infiltrate highly concerning for a lymphoproliferative disorder, possibly high-grade in the background of extensive granulomatous type inflammation and necrosis. - 2D echo (06/28/2022): LVEF 60-65%. - PET scan (07/07/2022): Markedly hypermetabolic adenopathy in the neck, chest, pelvis and inguinal regions.  Few subcentimeter pulmonary nodules are too small for PET resolution.  Hepatic steatosis. - Right inguinal lymph node biopsy (07/20/2022): Large B-cell lymphoma with Ki-67 70%.  FISH studies for high risk lymphoma sent. - IPI score-3 (age more than 77, stage III, elevated LDH) - 2D echo (06/28/2022): EF 60 to 65%. - 6 cycles Pola-R-CHP from 08/01/2022 through 11/21/2022.   2.  Social/family history: - She lives at home with her husband.  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  Non-smoker.  No history of exposure to chemicals or pesticides. - Mother died of pancreatic cancer.  Father had prostate cancer.  Sister had breast cancer at age  32.  Brother had kidney cancer at age 20.    PLAN:  1.  Stage III DLBCL: - After cycle 3, she did not have any infection.  She did not have to go to the ER. - Reviewed labs today which showed normal LFTs.  CBC shows white count 3.5 with normal ANC.  Platelet count and hemoglobin is adequate to proceed with cycle 6 today. - I will see her back in 5 to 6 weeks with repeat PET scan and labs including LDH.  2.  Acid reflux: - Continue Protonix 40 mg daily.  3.  Nausea: - Continue Zofran as needed.  No vomiting.  5.  Mouth ulcers: - Use lidocaine and Carafate as needed.    Orders placed this encounter:  Orders Placed This Encounter  Procedures   NM PET Image Restag (PS) Skull Base To  Thigh        Derek Jack, [place] [phone]

## 2022-11-21 NOTE — Patient Instructions (Signed)
[facility]  Discharge Instructions: Thank you for choosing [facility] to provide your oncology and hematology care.  If you have a lab appointment with the [facility], please come in thru the Main Entrance and check in at the main information desk.  Wear comfortable clothing and clothing appropriate for easy access to any Portacath or PICC line.   We strive to give you quality time with your provider. You may need to reschedule your appointment if you arrive late (15 or more minutes).  Arriving late affects you and other patients whose appointments are after yours.  Also, if you miss three or more appointments without notifying the office, you may be dismissed from the clinic at the provider's discretion.      For prescription refill requests, have your pharmacy contact our office and allow 72 hours for refills to be completed.    Today you received the following chemotherapy and/or immunotherapy agents Pola-RCHP      To help prevent nausea and vomiting after your treatment, we encourage you to take your nausea medication as directed.  BELOW ARE SYMPTOMS THAT SHOULD BE REPORTED IMMEDIATELY: *FEVER GREATER THAN 100.4 F (38 C) OR HIGHER *CHILLS OR SWEATING *NAUSEA AND VOMITING THAT IS NOT CONTROLLED WITH YOUR NAUSEA MEDICATION *UNUSUAL SHORTNESS OF BREATH *UNUSUAL BRUISING OR BLEEDING *URINARY PROBLEMS (pain or burning when urinating, or frequent urination) *BOWEL PROBLEMS (unusual diarrhea, constipation, pain near the anus) TENDERNESS IN MOUTH AND THROAT WITH OR WITHOUT PRESENCE OF ULCERS (sore throat, sores in mouth, or a toothache) UNUSUAL RASH, SWELLING OR PAIN  UNUSUAL VAGINAL DISCHARGE OR ITCHING   Items with * indicate a potential emergency and should be followed up as soon as possible or go to the Emergency Department if any problems should occur.  Please show the CHEMOTHERAPY ALERT CARD or IMMUNOTHERAPY ALERT CARD at check-in to the  Emergency Department and triage nurse.  Should you have questions after your visit or need to cancel or reschedule your appointment, please contact [facility] [phone]  and follow the prompts.  Office hours are 8:00 a.m. to 4:30 p.m. Monday - Friday. Please note that voicemails left after 4:00 p.m. may not be returned until the following business day.  We are closed weekends and major holidays. You have access to a nurse at all times for urgent questions. Please call the main number to the clinic [phone] and follow the prompts.  For any non-urgent questions, you may also contact your provider using MyChart. We now offer e-Visits for anyone 81 and older to request care online for non-urgent symptoms. For details visit [email or web address].   Also download the MyChart app! Go to the app store, search "MyChart", open the app, select [facility], and log in with your MyChart username and password.

## 2022-11-21 NOTE — Progress Notes (Signed)
Patient has been examined by Dr. Delton Coombes. Vital signs and labs have been reviewed by MD - ANC, Creatinine, LFTs, hemoglobin, and platelets are within treatment parameters per M.D. - pt may proceed with treatment.  Primary RN and pharmacy notified.

## 2022-11-21 NOTE — Progress Notes (Signed)
Patient presents today for Pola-R-Chop infusion per providers order.  Vital signs within parameters for treatment.  Labs pending.  Patient states that she didn't take her prednisone this morning at home.  Instructed patient to make the MD aware during office visit.  Labs reviewed by MD.  Messages received from Anastasio Champion RN/Dr. Delton Coombes patient okay for treatment.  Treatment given today per MD orders.  Stable during infusion without adverse affects.  Vital signs stable.  No complaints at this time.  Onpro placed and flashing green.  Discharge from clinic ambulatory in stable condition.  Alert and oriented X 3.  Follow up with [facility] as scheduled.

## 2022-11-23 ENCOUNTER — Other Ambulatory Visit: Payer: Self-pay

## 2022-11-28 ENCOUNTER — Other Ambulatory Visit: Payer: Self-pay

## 2022-12-17 ENCOUNTER — Other Ambulatory Visit: Payer: Self-pay | Admitting: Hematology

## 2022-12-19 ENCOUNTER — Encounter: Payer: Self-pay | Admitting: Hematology

## 2022-12-22 ENCOUNTER — Encounter (HOSPITAL_COMMUNITY)
Admission: RE | Admit: 2022-12-22 | Discharge: 2022-12-22 | Disposition: A | Discharge status: Home / Self Care | Payer: Managed Care, Other (non HMO) | Source: Ambulatory Visit | Attending: Hematology | Admitting: Hematology

## 2022-12-22 DIAGNOSIS — C8338 Diffuse large B-cell lymphoma, lymph nodes of multiple sites: Secondary | ICD-10-CM | POA: Insufficient documentation

## 2022-12-27 ENCOUNTER — Other Ambulatory Visit: Payer: Self-pay

## 2022-12-28 ENCOUNTER — Other Ambulatory Visit: Payer: Managed Care, Other (non HMO)

## 2022-12-28 ENCOUNTER — Ambulatory Visit: Payer: Managed Care, Other (non HMO) | Admitting: Hematology

## 2022-12-29 ENCOUNTER — Encounter (HOSPITAL_COMMUNITY)
Admission: RE | Admit: 2022-12-29 | Discharge: 2022-12-29 | Disposition: A | Discharge status: Home / Self Care | Payer: Managed Care, Other (non HMO) | Source: Ambulatory Visit | Attending: Hematology | Admitting: Hematology

## 2022-12-29 DIAGNOSIS — C8338 Diffuse large B-cell lymphoma, lymph nodes of multiple sites: Secondary | ICD-10-CM | POA: Diagnosis present

## 2022-12-29 MED ORDER — FLUDEOXYGLUCOSE F - 18 (FDG) INJECTION
8.8200 | Freq: Once | INTRAVENOUS | Status: AC | PRN
  Administered 2022-12-29: 8.82 via INTRAVENOUS

## 2023-01-04 NOTE — Progress Notes (Signed)
[facility] [address], [place] [address]    Clinic Day:  01/05/2023  Referring physician: Delorse Lek, FNP  Patient Care Team: Delorse Lek, FNP as PCP - General (Family Medicine) Doreatha Massed, MD as Medical Oncologist (Medical Oncology) Therese Sarah, RN as Oncology Nurse Navigator (Medical Oncology)   ASSESSMENT & PLAN:   Assessment: 1.  Stage III DLBCL: - CT soft tissue neck (06/06/2022): Left supraclavicular mass 1.9 x 1.6 cm central hypodensity.  Smaller adjacent nodule present.  Oval-shaped mass anterior to the left IJ vein 2.9 x 1.8 cm.  No definite 2 exophytic oropharyngeal or hypopharyngeal mass noted.  Thyroid intact.  Lung bases clear. - Left supraclavicular lymph node biopsy by Dr. Marcha Solders on 06/10/2022 - Pathology: Atypical B-cell lymphoid infiltrate highly concerning for a lymphoproliferative disorder, possibly high-grade in the background of extensive granulomatous type inflammation and necrosis. - 2D echo (06/28/2022): LVEF 60-65%. - PET scan (07/07/2022): Markedly hypermetabolic adenopathy in the neck, chest, pelvis and inguinal regions.  Few subcentimeter pulmonary nodules are too small for PET resolution.  Hepatic steatosis. - Right inguinal lymph node biopsy (07/20/2022): Large B-cell lymphoma with Ki-67 70%.  FISH studies for high risk lymphoma sent. - IPI score-3 (age more than 60, stage III, elevated LDH) - 2D echo (06/28/2022): EF 60 to 65%. - 6 cycles Pola-R-CHP from 08/01/2022 through 11/21/2022.   2.  Social/family history: - She lives at home with her husband.  Works as a Engineer, site for nursing homes.  Non-smoker.  No history of exposure to chemicals or pesticides. - Mother died of pancreatic cancer.  Father had prostate cancer.  Sister had breast cancer at age 63.  Brother had kidney cancer at age 3.   Plan: 1.  Stage III DLBCL: - Her energy levels are coming back. - Labs today: AST and ALT elevated at 126 and  100.  LDH normal.  CBC was normal. - Reviewed PET scan from 12/29/2022: No evidence of lymphoma.  Mild parenchymal thickening in the left upper lobe at site of prior airspace disease consistent with postinfectious scarring/atelectasis. - We discussed the surveillance plan in detail.  She will come back in 3 months with repeat labs.  Will do scan if there is clinical condition dictates.  She will continue to have port flushed every 3 months.     No orders of the defined types were placed in this encounter.     I,Katie Daubenspeck,acting as a Neurosurgeon for Doreatha Massed, MD.,have documented all relevant documentation on the behalf of Doreatha Massed, MD,as directed by  Doreatha Massed, MD while in the presence of Doreatha Massed, MD.   I, Doreatha Massed MD, have reviewed the above documentation for accuracy and completeness, and I agree with the above.   Doreatha Massed, MD   4/11/20242:58 PM  CHIEF COMPLAINT:   Diagnosis: stage III large B-cell lymphoma    Cancer Staging  Diffuse large B cell lymphoma Staging form: Hodgkin and Non-Hodgkin Lymphoma, AJCC 8th Edition - Clinical stage from 07/27/2022: Stage III (Diffuse large B-cell lymphoma) - Signed by Doreatha Massed, MD on 07/27/2022    Prior Therapy: none  Current Therapy:  Pola-R-CHP    HISTORY OF PRESENT ILLNESS:   Oncology History  Diffuse large B cell lymphoma  07/27/2022 Initial Diagnosis   Diffuse large B cell lymphoma (HCC)   07/27/2022 Cancer Staging   Staging form: Hodgkin and Non-Hodgkin Lymphoma, AJCC 8th Edition - Clinical stage from 07/27/2022: Stage III (Diffuse  large B-cell lymphoma) - Signed by Doreatha Massed, MD on 07/27/2022 Histopathologic type: Malignant lymphoma, large B-cell, diffuse, NOS Stage prefix: Initial diagnosis   08/01/2022 -  Chemotherapy   Patient is on Treatment Plan : NON-HODGKINS LYMPHOMA Pola-R-CHP (Polatuzumab + Rituximab -CHP) q21d        INTERVAL  HISTORY:   Debbie Hall is a 62 y.o. female presenting to clinic today for follow up of stage III large B-cell lymphoma. She was last seen by me on 11/21/22.  Since her last visit, she underwent restaging PET scan on 12/29/22 showing: no evidence of lymphoma recurrence; post infectious scarring/atelectasis in LUL.  Today, she states that she is doing well overall. Her appetite level is at 80%. Her energy level is at 60%.  PAST MEDICAL HISTORY:   Past Medical History: Past Medical History:  Diagnosis Date   Anemia    Anxiety    Diabetes mellitus (HCC)    Fatty liver    GERD (gastroesophageal reflux disease)    Gout    Hyperlipidemia    Hypertension    Lymphoma (HCC)    Migraines    Pneumonia    as a child   Port-A-Cath in place 07/28/2022    Surgical History: Past Surgical History:  Procedure Laterality Date   ABDOMINAL HYSTERECTOMY     partial   CESAREAN SECTION     x 2   TONSILLECTOMY      Social History: Social History   Socioeconomic History   Marital status: Married    Spouse name: Not on file   Number of children: 4   Years of education: Not on file   Highest education level: Not on file  Occupational History   Occupation: Interior and spatial designer of Nursing    Comment: [employer]  Tobacco Use   Smoking status: Never   Smokeless tobacco: Never  Vaping Use   Vaping Use: Never used  Substance and Sexual Activity   Alcohol use: Yes    Comment: occasional   Drug use: Never   Sexual activity: Not on file  Other Topics Concern   Not on file  Social History Narrative   Not on file   Social Determinants of Health   Financial Resource Strain: Not on file  Food Insecurity: No Food Insecurity (09/21/2022)   Hunger Vital Sign    Worried About Running Out of Food in the Last Year: Never true    Ran Out of Food in the Last Year: Never true  Transportation Needs: No Transportation Needs (09/21/2022)   PRAPARE - Administrator, Civil Service  (Medical): No    Lack of Transportation (Non-Medical): No  Physical Activity: Not on file  Stress: Not on file  Social Connections: Not on file  Intimate Partner Violence: Not At Risk (09/21/2022)   Humiliation, Afraid, Rape, and Kick questionnaire    Fear of Current or Ex-Partner: No    Emotionally Abused: No    Physically Abused: No    Sexually Abused: No    Family History: Family History  Problem Relation Age of Onset   Hypertension Mother    Heart disease Mother    Heart attack Mother    Pancreatic cancer Mother    Prostate cancer Father    Colon cancer Neg Hx    Esophageal cancer Neg Hx    Rectal cancer Neg Hx     Current Medications:  Current Outpatient Medications:    ALPRAZolam (XANAX) 1 MG tablet, Take 1 mg by mouth  3 (three) times daily as needed for anxiety or sleep., Disp: , Rfl:    escitalopram (LEXAPRO) 5 MG tablet, Take 1 tablet by mouth daily., Disp: , Rfl:    ibuprofen (ADVIL) 200 MG tablet, Take 600 mg by mouth every 6 (six) hours as needed for moderate pain., Disp: , Rfl:    ondansetron (ZOFRAN) 4 MG tablet, Take 1 tablet (4 mg total) by mouth every 8 (eight) hours as needed for nausea or vomiting. (Patient not taking: Reported on 01/05/2023), Disp: 60 tablet, Rfl: 3   promethazine (PHENERGAN) 25 MG tablet, Take 1 tablet (25 mg total) by mouth every 6 (six) hours as needed for nausea or vomiting. (Patient not taking: Reported on 11/21/2022), Disp: 30 tablet, Rfl: 4 No current facility-administered medications for this visit.  Facility-Administered Medications Ordered in Other Visits:    sodium chloride flush (NS) 0.9 % injection 10 mL, 10 mL, Intravenous, PRN, Doreatha Massed, MD, 10 mL at 01/05/23 1106   Allergies: Allergies  Allergen Reactions   Avocado Itching   Banana Itching   Latex Itching    Eyes watering, itching.   Lisinopril     angioedema   Penicillins     Facial swelling   Polivy [Polatuzumab Vedotin] Other (See Comments)    Dry  throat and trouble catching her breath.   Rituxan [Rituximab]     Itching in her throat    Vancomycin Itching    REVIEW OF SYSTEMS:   Review of Systems  Constitutional:  Negative for chills, fatigue and fever.  HENT:   Negative for lump/mass, mouth sores, nosebleeds, sore throat and trouble swallowing.   Eyes:  Negative for eye problems.  Respiratory:  Negative for cough and shortness of breath.   Cardiovascular:  Negative for chest pain, leg swelling and palpitations.  Gastrointestinal:  Negative for abdominal pain, constipation, diarrhea, nausea and vomiting.  Genitourinary:  Negative for bladder incontinence, difficulty urinating, dysuria, frequency, hematuria and nocturia.   Musculoskeletal:  Negative for arthralgias, back pain, flank pain, myalgias and neck pain.  Skin:  Negative for itching and rash.  Neurological:  Positive for headaches. Negative for dizziness and numbness.  Hematological:  Does not bruise/bleed easily.  Psychiatric/Behavioral:  Positive for sleep disturbance. Negative for depression and suicidal ideas. The patient is nervous/anxious.   All other systems reviewed and are negative.    VITALS:   Blood pressure (!) 142/84, pulse 87, temperature 97.9 F (36.6 C), temperature source Tympanic, resp. rate 18, SpO2 100 %.  Wt Readings from Last 3 Encounters:  11/21/22 169 lb (76.7 kg)  10/31/22 166 lb 3.2 oz (75.4 kg)  10/20/22 162 lb 9.6 oz (73.8 kg)    There is no height or weight on file to calculate BMI.  Performance status (ECOG): 0 - Asymptomatic  PHYSICAL EXAM:   Physical Exam Vitals and nursing note reviewed. Exam conducted with a chaperone present.  Constitutional:      Appearance: Normal appearance.  Cardiovascular:     Rate and Rhythm: Normal rate and regular rhythm.     Pulses: Normal pulses.     Heart sounds: Normal heart sounds.  Pulmonary:     Effort: Pulmonary effort is normal.     Breath sounds: Normal breath sounds.  Abdominal:      Palpations: Abdomen is soft. There is no hepatomegaly, splenomegaly or mass.     Tenderness: There is no abdominal tenderness.  Musculoskeletal:     Right lower leg: No edema.     Left  lower leg: No edema.  Lymphadenopathy:     Cervical: No cervical adenopathy.     Right cervical: No superficial, deep or posterior cervical adenopathy.    Left cervical: No superficial, deep or posterior cervical adenopathy.     Upper Body:     Right upper body: No supraclavicular or axillary adenopathy.     Left upper body: No supraclavicular or axillary adenopathy.  Neurological:     General: No focal deficit present.     Mental Status: She is alert and oriented to person, place, and time.  Psychiatric:        Mood and Affect: Mood normal.        Behavior: Behavior normal.     LABS:      Latest Ref Rng & Units 01/05/2023   11:07 AM 11/21/2022    8:03 AM 10/31/2022    8:10 AM  CBC  WBC 4.0 - 10.5 K/uL 4.8  3.5  4.6   Hemoglobin 12.0 - 15.0 g/dL 22.3  9.6  36.1   Hematocrit 36.0 - 46.0 % 38.8  30.4  31.3   Platelets 150 - 400 K/uL 187  193  219       Latest Ref Rng & Units 01/05/2023   11:07 AM 11/21/2022    8:03 AM 10/31/2022    8:10 AM  CMP  Glucose 70 - 99 mg/dL 99  224  497   BUN 8 - 23 mg/dL 13  15  12    Creatinine 0.44 - 1.00 mg/dL 5.30  0.51  1.02   Sodium 135 - 145 mmol/L 138  137  137   Potassium 3.5 - 5.1 mmol/L 4.3  4.0  4.1   Chloride 98 - 111 mmol/L 104  107  108   CO2 22 - 32 mmol/L 24  22  21    Calcium 8.9 - 10.3 mg/dL 8.9  8.3  8.6   Total Protein 6.5 - 8.1 g/dL 6.8  6.0  6.2   Total Bilirubin 0.3 - 1.2 mg/dL 0.7  0.5  0.4   Alkaline Phos 38 - 126 U/L 71  74  61   AST 15 - 41 U/L 126  39  50   ALT 0 - 44 U/L 100  29  35      No results found for: "CEA1", "CEA" / No results found for: "CEA1", "CEA" No results found for: "PSA1" No results found for: "TRZ735" No results found for: "CAN125"  No results found for: "TOTALPROTELP", "ALBUMINELP", "A1GS", "A2GS", "BETS",  "BETA2SER", "GAMS", "MSPIKE", "SPEI" Lab Results  Component Value Date   TIBC 379 09/07/2020   FERRITIN 252.9 09/07/2020   IRONPCTSAT 25.6 09/07/2020   IRONPCTSAT 25 09/07/2020   Lab Results  Component Value Date   LDH 184 01/05/2023   LDH 200 (H) 08/23/2022   LDH 202 (H) 06/24/2022     STUDIES:   NM PET Image Restag (PS) Skull Base To Thigh  Result Date: 12/30/2022 CLINICAL DATA:  Subsequent treatment strategy for lymphoma. Diffuse large B-cell lymphoma. EXAM: NUCLEAR MEDICINE PET SKULL BASE TO THIGH TECHNIQUE: 8.8 mCi F-18 FDG was injected intravenously. Full-ring PET imaging was performed from the skull base to thigh after the radiotracer. CT data was obtained and used for attenuation correction and anatomic localization. Fasting blood glucose: 90 mg/dl COMPARISON:  None Available. FINDINGS: Mediastinal blood pool activity: SUV max Liver activity: SUV max NA NECK: No hypermetabolic lymph nodes in the neck. Incidental CT findings: None. CHEST: No hypermetabolic mediastinal or  hilar nodes. No suspicious pulmonary nodules on the CT scan. Incidental CT findings: Mild parenchymal thickening in the LEFT upper lobe at site of prior airspace disease consistent with post infectious scarring/atelectasis. No metabolic active ABDOMEN/PELVIS: No abnormal hypermetabolic activity within the liver, pancreas, adrenal glands, or spleen. No hypermetabolic lymph nodes in the abdomen or pelvis. Incidental CT findings: None. SKELETON: No focal hypermetabolic activity to suggest skeletal metastasis. Incidental CT findings: None. IMPRESSION: 1. No evidence of lymphoma recurrence on whole-body FDG PET scan. 2. Mild parenchymal thickening in the LEFT upper lobe at site of prior airspace disease consistent with post infectious scarring/atelectasis. Electronically Signed   By: Genevive BiStewart  Edmunds M.D.   On: 12/30/2022 09:03

## 2023-01-05 ENCOUNTER — Inpatient Hospital Stay: Payer: Managed Care, Other (non HMO) | Attending: Hematology

## 2023-01-05 ENCOUNTER — Inpatient Hospital Stay: Payer: Managed Care, Other (non HMO) | Admitting: Hematology

## 2023-01-05 VITALS — BP 142/84 | HR 87 | Temp 97.9°F | Resp 18

## 2023-01-05 DIAGNOSIS — Z803 Family history of malignant neoplasm of breast: Secondary | ICD-10-CM | POA: Insufficient documentation

## 2023-01-05 DIAGNOSIS — Z95828 Presence of other vascular implants and grafts: Secondary | ICD-10-CM

## 2023-01-05 DIAGNOSIS — C8338 Diffuse large B-cell lymphoma, lymph nodes of multiple sites: Secondary | ICD-10-CM

## 2023-01-05 DIAGNOSIS — Z800 Family history of malignant neoplasm of digestive organs: Secondary | ICD-10-CM | POA: Diagnosis not present

## 2023-01-05 DIAGNOSIS — C8330 Diffuse large B-cell lymphoma, unspecified site: Secondary | ICD-10-CM | POA: Diagnosis present

## 2023-01-05 LAB — COMPREHENSIVE METABOLIC PANEL
ALT: 100 U/L — ABNORMAL HIGH (ref 0–44)
AST: 126 U/L — ABNORMAL HIGH (ref 15–41)
Albumin: 4 g/dL (ref 3.5–5.0)
Alkaline Phosphatase: 71 U/L (ref 38–126)
Anion gap: 10 (ref 5–15)
BUN: 13 mg/dL (ref 8–23)
CO2: 24 mmol/L (ref 22–32)
Calcium: 8.9 mg/dL (ref 8.9–10.3)
Chloride: 104 mmol/L (ref 98–111)
Creatinine, Ser: 0.67 mg/dL (ref 0.44–1.00)
GFR, Estimated: >60 mL/min (ref >60)
Glucose, Bld: 99 mg/dL (ref 70–99)
Potassium: 4.3 mmol/L (ref 3.5–5.1)
Sodium: 138 mmol/L (ref 135–145)
Total Bilirubin: 0.7 mg/dL (ref 0.3–1.2)
Total Protein: 6.8 g/dL (ref 6.5–8.1)

## 2023-01-05 LAB — CBC WITH DIFFERENTIAL/PLATELET
Abs Immature Granulocytes: 0.02 10*3/uL (ref 0.00–0.07)
Basophils Absolute: 0 10*3/uL (ref 0.0–0.1)
Basophils Relative: 1 %
Eosinophils Absolute: 0.1 10*3/uL (ref 0.0–0.5)
Eosinophils Relative: 3 %
HCT: 38.8 % (ref 36.0–46.0)
Hemoglobin: 12.5 g/dL (ref 12.0–15.0)
Immature Granulocytes: 0 %
Lymphocytes Relative: 9 %
Lymphs Abs: 0.4 10*3/uL — ABNORMAL LOW (ref 0.7–4.0)
MCH: 30 pg (ref 26.0–34.0)
MCHC: 32.2 g/dL (ref 30.0–36.0)
MCV: 93 fL (ref 80.0–100.0)
Monocytes Absolute: 0.5 10*3/uL (ref 0.1–1.0)
Monocytes Relative: 10 %
Neutro Abs: 3.8 10*3/uL (ref 1.7–7.7)
Neutrophils Relative %: 77 %
Platelets: 187 10*3/uL (ref 150–400)
RBC: 4.17 MIL/uL (ref 3.87–5.11)
RDW: 14.4 % (ref 11.5–15.5)
WBC: 4.8 10*3/uL (ref 4.0–10.5)
nRBC: 0 % (ref 0.0–0.2)

## 2023-01-05 LAB — LACTATE DEHYDROGENASE: LDH: 184 U/L (ref 98–192)

## 2023-01-05 LAB — MAGNESIUM: Magnesium: 1.9 mg/dL (ref 1.7–2.4)

## 2023-01-05 MED ORDER — SODIUM CHLORIDE 0.9% FLUSH
10.0000 mL | INTRAVENOUS | Status: DC | PRN
  Administered 2023-01-05: 10 mL via INTRAVENOUS

## 2023-01-05 MED ORDER — HEPARIN SOD (PORK) LOCK FLUSH 100 UNIT/ML IV SOLN
500.0000 [IU] | Freq: Once | INTRAVENOUS | Status: AC
  Administered 2023-01-05: 500 [IU] via INTRAVENOUS

## 2023-01-05 NOTE — Progress Notes (Signed)
Patients port flushed without difficulty.  Good blood return noted with no bruising or swelling noted at site.  Band aid applied.  VSS with discharge and left in satisfactory condition with no s/s of distress noted.   

## 2023-01-05 NOTE — Patient Instructions (Signed)
[facility]  Discharge Instructions: Thank you for choosing [facility] to provide your oncology and hematology care.  If you have a lab appointment with the Cancer Center - please note that after April 8th, 2024, all labs will be drawn in the cancer center.  You do not have to check in or register with the main entrance as you have in the past but will complete your check-in in the cancer center.  Wear comfortable clothing and clothing appropriate for easy access to any Portacath or PICC line.   We strive to give you quality time with your provider. You may need to reschedule your appointment if you arrive late (15 or more minutes).  Arriving late affects you and other patients whose appointments are after yours.  Also, if you miss three or more appointments without notifying the office, you may be dismissed from the clinic at the provider's discretion.      For prescription refill requests, have your pharmacy contact our office and allow 72 hours for refills to be completed.    Today you received the following portflush and labs.  To help prevent nausea and vomiting after your treatment, we encourage you to take your nausea medication as directed.  BELOW ARE SYMPTOMS THAT SHOULD BE REPORTED IMMEDIATELY: *FEVER GREATER THAN 100.4 F (38 C) OR HIGHER *CHILLS OR SWEATING *NAUSEA AND VOMITING THAT IS NOT CONTROLLED WITH YOUR NAUSEA MEDICATION *UNUSUAL SHORTNESS OF BREATH *UNUSUAL BRUISING OR BLEEDING *URINARY PROBLEMS (pain or burning when urinating, or frequent urination) *BOWEL PROBLEMS (unusual diarrhea, constipation, pain near the anus) TENDERNESS IN MOUTH AND THROAT WITH OR WITHOUT PRESENCE OF ULCERS (sore throat, sores in mouth, or a toothache) UNUSUAL RASH, SWELLING OR PAIN  UNUSUAL VAGINAL DISCHARGE OR ITCHING   Items with * indicate a potential emergency and should be followed up as soon as possible or go to the Emergency Department if any problems  should occur.  Please show the CHEMOTHERAPY ALERT CARD or IMMUNOTHERAPY ALERT CARD at check-in to the Emergency Department and triage nurse.  Should you have questions after your visit or need to cancel or reschedule your appointment, please contact [facility] [phone]  and follow the prompts.  Office hours are 8:00 a.m. to 4:30 p.m. Monday - Friday. Please note that voicemails left after 4:00 p.m. may not be returned until the following business day.  We are closed weekends and major holidays. You have access to a nurse at all times for urgent questions. Please call the main number to the clinic [phone] and follow the prompts.  For any non-urgent questions, you may also contact your provider using MyChart. We now offer e-Visits for anyone 18 and older to request care online for non-urgent symptoms. For details visit [email or web address].   Also download the MyChart app! Go to the app store, search "MyChart", open the app, select , and log in with your MyChart username and password.   

## 2023-01-05 NOTE — Patient Instructions (Signed)
[facility] at [facility] Discharge Instructions   You were seen and examined today by Dr. Ellin Saba.  He reviewed the results of your scan which was normal. There is no evidence of lymphoma on this exam.   He reviewed the results of your lab work which are normal/stable.   We will see you back in 3 months. We will repeat lab work prior to your next.   Thank you for choosing [facility] at [facility] to provide your oncology and hematology care.  To afford each patient quality time with our provider, please arrive at least 15 minutes before your scheduled appointment time.   If you have a lab appointment with the Cancer Center please come in thru the Main Entrance and check in at the main information desk.  You need to re-schedule your appointment should you arrive 10 or more minutes late.  We strive to give you quality time with our providers, and arriving late affects you and other patients whose appointments are after yours.  Also, if you no show three or more times for appointments you may be dismissed from the clinic at the providers discretion.     Again, thank you for choosing [facility].  Our hope is that these requests will decrease the amount of time that you wait before being seen by our physicians.       _____________________________________________________________  Should you have questions after your visit to [facility], please contact our office at [phone] and follow the prompts.  Our office hours are 8:00 a.m. and 4:30 p.m. Monday - Friday.  Please note that voicemails left after 4:00 p.m. may not be returned until the following business day.  We are closed weekends and major holidays.  You do have access to a nurse 24-7, just call the main number to the clinic [phone] and do not press any options, hold on the line and a nurse will answer the phone.    For prescription refill requests, have  your pharmacy contact our office and allow 72 hours.    Due to Covid, you will need to wear a mask upon entering the hospital. If you do not have a mask, a mask will be given to you at the Main Entrance upon arrival. For doctor visits, patients may have 1 support person age 50 or older with them. For treatment visits, patients can not have anyone with them due to social distancing guidelines and our immunocompromised population.

## 2023-01-06 ENCOUNTER — Other Ambulatory Visit: Payer: Self-pay

## 2023-02-14 ENCOUNTER — Other Ambulatory Visit: Payer: Self-pay

## 2023-04-04 ENCOUNTER — Other Ambulatory Visit: Payer: Self-pay

## 2023-04-04 DIAGNOSIS — C8338 Diffuse large B-cell lymphoma, lymph nodes of multiple sites: Secondary | ICD-10-CM

## 2023-04-05 NOTE — Progress Notes (Incomplete)
[facility] [address], [place] [address]    Clinic Day:  04/06/2023  Referring physician: Delorse Lek, FNP  Patient Care Team: Delorse Lek, FNP as PCP - General (Family Medicine) Doreatha Massed, MD as Medical Oncologist (Medical Oncology) Therese Sarah, RN as Oncology Nurse Navigator (Medical Oncology)   ASSESSMENT & PLAN:   Assessment: 1.  Stage III DLBCL: - CT soft tissue neck (06/06/2022): Left supraclavicular mass 1.9 x 1.6 cm central hypodensity.  Smaller adjacent nodule present.  Oval-shaped mass anterior to the left IJ vein 2.9 x 1.8 cm.  No definite 2 exophytic oropharyngeal or hypopharyngeal mass noted.  Thyroid intact.  Lung bases clear. - Left supraclavicular lymph node biopsy by Dr. Marcha Solders on 06/10/2022 - Pathology: Atypical B-cell lymphoid infiltrate highly concerning for a lymphoproliferative disorder, possibly high-grade in the background of extensive granulomatous type inflammation and necrosis. - 2D echo (06/28/2022): LVEF 60-65%. - PET scan (07/07/2022): Markedly hypermetabolic adenopathy in the neck, chest, pelvis and inguinal regions.  Few subcentimeter pulmonary nodules are too small for PET resolution.  Hepatic steatosis. - Right inguinal lymph node biopsy (07/20/2022): Large B-cell lymphoma with Ki-67 70%.  FISH studies for high risk lymphoma sent. - IPI score-3 (age more than 60, stage III, elevated LDH) - 2D echo (06/28/2022): EF 60 to 65%. - 6 cycles Pola-R-CHP from 08/01/2022 through 11/21/2022.   2.  Social/family history: - She lives at home with her husband.  Works as a Engineer, site for nursing homes.  Non-smoker.  No history of exposure to chemicals or pesticides. - Mother died of pancreatic cancer.  Father had prostate cancer.  Sister had breast cancer at age 95.  Brother had kidney cancer at age 102.   Plan: 1.  Stage III DLBCL: - Her energy levels are coming back. - Labs today: AST and ALT elevated at 126 and  100.  LDH normal.  CBC was normal. - Reviewed PET scan from 12/29/2022: No evidence of lymphoma.  Mild parenchymal thickening in the left upper lobe at site of prior airspace disease consistent with postinfectious scarring/atelectasis. - We discussed the surveillance plan in detail.  She will come back in 3 months with repeat labs.  Will do scan if there is clinical condition dictates.  She will continue to have port flushed every 3 months.     Orders Placed This Encounter  Procedures   Lactate dehydrogenase    Standing Status:   Future    Standing Expiration Date:   04/04/2024       Alben Deeds Teague,acting as a scribe for Doreatha Massed, MD.,have documented all relevant documentation on the behalf of Doreatha Massed, MD,as directed by  Doreatha Massed, MD while in the presence of Doreatha Massed, MD.  ***   Pittman R Teague   7/10/20249:33 PM  CHIEF COMPLAINT:   Diagnosis: stage III large B-cell lymphoma    Cancer Staging  Diffuse large B cell lymphoma (HCC) Staging form: Hodgkin and Non-Hodgkin Lymphoma, AJCC 8th Edition - Clinical stage from 07/27/2022: Stage III (Diffuse large B-cell lymphoma) - Signed by Doreatha Massed, MD on 07/27/2022    Prior Therapy: none  Current Therapy:  Pola-R-CHP    HISTORY OF PRESENT ILLNESS:   Oncology History  Diffuse large B cell lymphoma (HCC)  07/27/2022 Initial Diagnosis   Diffuse large B cell lymphoma (HCC)   07/27/2022 Cancer Staging   Staging form: Hodgkin and Non-Hodgkin Lymphoma, AJCC 8th Edition - Clinical stage from 07/27/2022: Stage  III (Diffuse large B-cell lymphoma) - Signed by Doreatha Massed, MD on 07/27/2022 Histopathologic type: Malignant lymphoma, large B-cell, diffuse, NOS Stage prefix: Initial diagnosis   08/01/2022 -  Chemotherapy   Patient is on Treatment Plan : NON-HODGKINS LYMPHOMA Pola-R-CHP (Polatuzumab + Rituximab -CHP) q21d        INTERVAL HISTORY:   Debbie Hall is a 62 y.o. female  presenting to clinic today for follow up of stage III large B-cell lymphoma. She was last seen by me on 01/05/23.  Today, she states that she is doing well overall. Her appetite level is at ***%. Her energy level is at ***%.  PAST MEDICAL HISTORY:   Past Medical History: Past Medical History:  Diagnosis Date   Anemia    Anxiety    Diabetes mellitus (HCC)    Fatty liver    GERD (gastroesophageal reflux disease)    Gout    Hyperlipidemia    Hypertension    Lymphoma (HCC)    Migraines    Pneumonia    as a child   Port-A-Cath in place 07/28/2022    Surgical History: Past Surgical History:  Procedure Laterality Date   ABDOMINAL HYSTERECTOMY     partial   CESAREAN SECTION     x 2   TONSILLECTOMY      Social History: Social History   Socioeconomic History   Marital status: Married    Spouse name: Not on file   Number of children: 4   Years of education: Not on file   Highest education level: Not on file  Occupational History   Occupation: Interior and spatial designer of Nursing    Comment: [employer]  Tobacco Use   Smoking status: Never   Smokeless tobacco: Never  Vaping Use   Vaping Use: Never used  Substance and Sexual Activity   Alcohol use: Yes    Comment: occasional   Drug use: Never   Sexual activity: Not on file  Other Topics Concern   Not on file  Social History Narrative   Not on file   Social Determinants of Health   Financial Resource Strain: Not on file  Food Insecurity: No Food Insecurity (09/21/2022)   Hunger Vital Sign    Worried About Running Out of Food in the Last Year: Never true    Ran Out of Food in the Last Year: Never true  Transportation Needs: No Transportation Needs (09/21/2022)   PRAPARE - Administrator, Civil Service (Medical): No    Lack of Transportation (Non-Medical): No  Physical Activity: Not on file  Stress: Not on file  Social Connections: Not on file  Intimate Partner Violence: Not At Risk (09/21/2022)    Humiliation, Afraid, Rape, and Kick questionnaire    Fear of Current or Ex-Partner: No    Emotionally Abused: No    Physically Abused: No    Sexually Abused: No    Family History: Family History  Problem Relation Age of Onset   Hypertension Mother    Heart disease Mother    Heart attack Mother    Pancreatic cancer Mother    Prostate cancer Father    Colon cancer Neg Hx    Esophageal cancer Neg Hx    Rectal cancer Neg Hx     Current Medications:  Current Outpatient Medications:    ALPRAZolam (XANAX) 1 MG tablet, Take 1 mg by mouth 3 (three) times daily as needed for anxiety or sleep., Disp: , Rfl:    escitalopram (LEXAPRO) 5 MG tablet,  Take 1 tablet by mouth daily., Disp: , Rfl:    ibuprofen (ADVIL) 200 MG tablet, Take 600 mg by mouth every 6 (six) hours as needed for moderate pain., Disp: , Rfl:    ondansetron (ZOFRAN) 4 MG tablet, Take 1 tablet (4 mg total) by mouth every 8 (eight) hours as needed for nausea or vomiting. (Patient not taking: Reported on 01/05/2023), Disp: 60 tablet, Rfl: 3   promethazine (PHENERGAN) 25 MG tablet, Take 1 tablet (25 mg total) by mouth every 6 (six) hours as needed for nausea or vomiting. (Patient not taking: Reported on 11/21/2022), Disp: 30 tablet, Rfl: 4   Allergies: Allergies  Allergen Reactions   Avocado Itching   Banana Itching   Latex Itching    Eyes watering, itching.   Lisinopril     angioedema   Penicillins     Facial swelling   Polivy [Polatuzumab Vedotin] Other (See Comments)    Dry throat and trouble catching her breath.   Rituxan [Rituximab]     Itching in her throat    Vancomycin Itching    REVIEW OF SYSTEMS:   Review of Systems  Constitutional:  Negative for chills, fatigue and fever.  HENT:   Negative for lump/mass, mouth sores, nosebleeds, sore throat and trouble swallowing.   Eyes:  Negative for eye problems.  Respiratory:  Negative for cough and shortness of breath.   Cardiovascular:  Negative for chest pain, leg  swelling and palpitations.  Gastrointestinal:  Negative for abdominal pain, constipation, diarrhea, nausea and vomiting.  Genitourinary:  Negative for bladder incontinence, difficulty urinating, dysuria, frequency, hematuria and nocturia.   Musculoskeletal:  Negative for arthralgias, back pain, flank pain, myalgias and neck pain.  Skin:  Negative for itching and rash.  Neurological:  Negative for dizziness, headaches and numbness.  Hematological:  Does not bruise/bleed easily.  Psychiatric/Behavioral:  Negative for depression, sleep disturbance and suicidal ideas. The patient is not nervous/anxious.   All other systems reviewed and are negative.    VITALS:   There were no vitals taken for this visit.  Wt Readings from Last 3 Encounters:  11/21/22 169 lb (76.7 kg)  10/31/22 166 lb 3.2 oz (75.4 kg)  10/20/22 162 lb 9.6 oz (73.8 kg)    There is no height or weight on file to calculate BMI.  Performance status (ECOG): 0 - Asymptomatic  PHYSICAL EXAM:   Physical Exam Vitals and nursing note reviewed. Exam conducted with a chaperone present.  Constitutional:      Appearance: Normal appearance.  Cardiovascular:     Rate and Rhythm: Normal rate and regular rhythm.     Pulses: Normal pulses.     Heart sounds: Normal heart sounds.  Pulmonary:     Effort: Pulmonary effort is normal.     Breath sounds: Normal breath sounds.  Abdominal:     Palpations: Abdomen is soft. There is no hepatomegaly, splenomegaly or mass.     Tenderness: There is no abdominal tenderness.  Musculoskeletal:     Right lower leg: No edema.     Left lower leg: No edema.  Lymphadenopathy:     Cervical: No cervical adenopathy.     Right cervical: No superficial, deep or posterior cervical adenopathy.    Left cervical: No superficial, deep or posterior cervical adenopathy.     Upper Body:     Right upper body: No supraclavicular or axillary adenopathy.     Left upper body: No supraclavicular or axillary  adenopathy.  Neurological:  General: No focal deficit present.     Mental Status: She is alert and oriented to person, place, and time.  Psychiatric:        Mood and Affect: Mood normal.        Behavior: Behavior normal.     LABS:      Latest Ref Rng & Units 01/05/2023   11:07 AM 11/21/2022    8:03 AM 10/31/2022    8:10 AM  CBC  WBC 4.0 - 10.5 K/uL 4.8  3.5  4.6   Hemoglobin 12.0 - 15.0 g/dL 16.1  9.6  09.6   Hematocrit 36.0 - 46.0 % 38.8  30.4  31.3   Platelets 150 - 400 K/uL 187  193  219       Latest Ref Rng & Units 01/05/2023   11:07 AM 11/21/2022    8:03 AM 10/31/2022    8:10 AM  CMP  Glucose 70 - 99 mg/dL 99  045  409   BUN 8 - 23 mg/dL 13  15  12    Creatinine 0.44 - 1.00 mg/dL 8.11  9.14  7.82   Sodium 135 - 145 mmol/L 138  137  137   Potassium 3.5 - 5.1 mmol/L 4.3  4.0  4.1   Chloride 98 - 111 mmol/L 104  107  108   CO2 22 - 32 mmol/L 24  22  21    Calcium 8.9 - 10.3 mg/dL 8.9  8.3  8.6   Total Protein 6.5 - 8.1 g/dL 6.8  6.0  6.2   Total Bilirubin 0.3 - 1.2 mg/dL 0.7  0.5  0.4   Alkaline Phos 38 - 126 U/L 71  74  61   AST 15 - 41 U/L 126  39  50   ALT 0 - 44 U/L 100  29  35      No results found for: "CEA1", "CEA" / No results found for: "CEA1", "CEA" No results found for: "PSA1" No results found for: "NFA213" No results found for: "CAN125"  No results found for: "TOTALPROTELP", "ALBUMINELP", "A1GS", "A2GS", "BETS", "BETA2SER", "GAMS", "MSPIKE", "SPEI" Lab Results  Component Value Date   TIBC 379 09/07/2020   FERRITIN 252.9 09/07/2020   IRONPCTSAT 25.6 09/07/2020   IRONPCTSAT 25 09/07/2020   Lab Results  Component Value Date   LDH 184 01/05/2023   LDH 200 (H) 08/23/2022   LDH 202 (H) 06/24/2022     STUDIES:   No results found.

## 2023-04-06 ENCOUNTER — Inpatient Hospital Stay: Payer: Managed Care, Other (non HMO)

## 2023-04-06 ENCOUNTER — Inpatient Hospital Stay: Payer: Managed Care, Other (non HMO) | Admitting: Hematology

## 2023-04-06 DIAGNOSIS — C8338 Diffuse large B-cell lymphoma, lymph nodes of multiple sites: Secondary | ICD-10-CM

## 2023-04-07 ENCOUNTER — Other Ambulatory Visit: Payer: Self-pay

## 2023-04-26 ENCOUNTER — Inpatient Hospital Stay: Payer: Managed Care, Other (non HMO) | Admitting: Hematology

## 2023-04-26 ENCOUNTER — Encounter: Payer: Self-pay | Admitting: Hematology

## 2023-04-26 ENCOUNTER — Inpatient Hospital Stay: Payer: Managed Care, Other (non HMO) | Attending: Hematology

## 2023-04-26 DIAGNOSIS — C8338 Diffuse large B-cell lymphoma, lymph nodes of multiple sites: Secondary | ICD-10-CM

## 2023-04-26 DIAGNOSIS — Z800 Family history of malignant neoplasm of digestive organs: Secondary | ICD-10-CM | POA: Diagnosis not present

## 2023-04-26 DIAGNOSIS — R0609 Other forms of dyspnea: Secondary | ICD-10-CM | POA: Diagnosis not present

## 2023-04-26 DIAGNOSIS — R0602 Shortness of breath: Secondary | ICD-10-CM | POA: Diagnosis not present

## 2023-04-26 DIAGNOSIS — K760 Fatty (change of) liver, not elsewhere classified: Secondary | ICD-10-CM | POA: Diagnosis not present

## 2023-04-26 DIAGNOSIS — C8330 Diffuse large B-cell lymphoma, unspecified site: Secondary | ICD-10-CM | POA: Insufficient documentation

## 2023-04-26 DIAGNOSIS — C8590 Non-Hodgkin lymphoma, unspecified, unspecified site: Secondary | ICD-10-CM

## 2023-04-26 LAB — CBC WITH DIFFERENTIAL/PLATELET
Abs Immature Granulocytes: 0.07 10*3/uL (ref 0.00–0.07)
Basophils Absolute: 0.1 10*3/uL (ref 0.0–0.1)
Basophils Relative: 1 %
Eosinophils Absolute: 0.3 10*3/uL (ref 0.0–0.5)
Eosinophils Relative: 6 %
HCT: 41.2 % (ref 36.0–46.0)
Hemoglobin: 13.3 g/dL (ref 12.0–15.0)
Immature Granulocytes: 1 %
Lymphocytes Relative: 5 %
Lymphs Abs: 0.3 10*3/uL — ABNORMAL LOW (ref 0.7–4.0)
MCH: 29.7 pg (ref 26.0–34.0)
MCHC: 32.3 g/dL (ref 30.0–36.0)
MCV: 92 fL (ref 80.0–100.0)
Monocytes Absolute: 0.5 10*3/uL (ref 0.1–1.0)
Monocytes Relative: 9 %
Neutro Abs: 4.3 10*3/uL (ref 1.7–7.7)
Neutrophils Relative %: 78 %
Platelets: 168 10*3/uL (ref 150–400)
RBC: 4.48 MIL/uL (ref 3.87–5.11)
RDW: 13.6 % (ref 11.5–15.5)
WBC: 5.4 10*3/uL (ref 4.0–10.5)
nRBC: 0 % (ref 0.0–0.2)

## 2023-04-26 LAB — URIC ACID: Uric Acid, Serum: 5.4 mg/dL (ref 2.5–7.1)

## 2023-04-26 LAB — COMPREHENSIVE METABOLIC PANEL
ALT: 104 U/L — ABNORMAL HIGH (ref 0–44)
AST: 98 U/L — ABNORMAL HIGH (ref 15–41)
Albumin: 3.8 g/dL (ref 3.5–5.0)
Alkaline Phosphatase: 98 U/L (ref 38–126)
Anion gap: 8 (ref 5–15)
BUN: 18 mg/dL (ref 8–23)
CO2: 23 mmol/L (ref 22–32)
Calcium: 8.8 mg/dL — ABNORMAL LOW (ref 8.9–10.3)
Chloride: 106 mmol/L (ref 98–111)
Creatinine, Ser: 0.72 mg/dL (ref 0.44–1.00)
GFR, Estimated: >60 mL/min (ref >60)
Glucose, Bld: 94 mg/dL (ref 70–99)
Potassium: 4.4 mmol/L (ref 3.5–5.1)
Sodium: 137 mmol/L (ref 135–145)
Total Bilirubin: 0.5 mg/dL (ref 0.3–1.2)
Total Protein: 6.4 g/dL — ABNORMAL LOW (ref 6.5–8.1)

## 2023-04-26 LAB — LACTATE DEHYDROGENASE: LDH: 184 U/L (ref 98–192)

## 2023-04-26 LAB — MAGNESIUM: Magnesium: 2.1 mg/dL (ref 1.7–2.4)

## 2023-04-26 MED ORDER — HEPARIN SOD (PORK) LOCK FLUSH 100 UNIT/ML IV SOLN
500.0000 [IU] | Freq: Once | INTRAVENOUS | Status: AC
  Administered 2023-04-26: 500 [IU] via INTRAVENOUS

## 2023-04-26 MED ORDER — SODIUM CHLORIDE 0.9% FLUSH
10.0000 mL | Freq: Once | INTRAVENOUS | Status: AC
  Administered 2023-04-26: 10 mL via INTRAVENOUS

## 2023-04-26 NOTE — Progress Notes (Signed)
Patients port flushed without difficulty.  Good blood return noted with no bruising or swelling noted at site.  Band aid applied.  VSS with discharge and left in satisfactory condition with no s/s of distress noted.   

## 2023-04-26 NOTE — Progress Notes (Signed)
[facility] [address], [place] [address]    Clinic Day:  04/26/23   Referring physician: Delorse Lek, FNP  Patient Care Team: Delorse Lek, FNP as PCP - General (Family Medicine) Doreatha Massed, MD as Medical Oncologist (Medical Oncology) Therese Sarah, RN as Oncology Nurse Navigator (Medical Oncology)   ASSESSMENT & PLAN:   Assessment: 1.  Stage III DLBCL: - CT soft tissue neck (06/06/2022): Left supraclavicular mass 1.9 x 1.6 cm central hypodensity.  Smaller adjacent nodule present.  Oval-shaped mass anterior to the left IJ vein 2.9 x 1.8 cm.  No definite 2 exophytic oropharyngeal or hypopharyngeal mass noted.  Thyroid intact.  Lung bases clear. - Left supraclavicular lymph node biopsy by Dr. Marcha Solders on 06/10/2022 - Pathology: Atypical B-cell lymphoid infiltrate highly concerning for a lymphoproliferative disorder, possibly high-grade in the background of extensive granulomatous type inflammation and necrosis. - 2D echo (06/28/2022): LVEF 60-65%. - PET scan (07/07/2022): Markedly hypermetabolic adenopathy in the neck, chest, pelvis and inguinal regions.  Few subcentimeter pulmonary nodules are too small for PET resolution.  Hepatic steatosis. - Right inguinal lymph node biopsy (07/20/2022): Large B-cell lymphoma with Ki-67 70%.  FISH studies for high risk lymphoma sent. - IPI score-3 (age more than 60, stage III, elevated LDH) - 2D echo (06/28/2022): EF 60 to 65%. - 6 cycles Pola-R-CHP from 08/01/2022 through 11/21/2022.   2.  Social/family history: - She lives at home with her husband.  Works as a Engineer, site for nursing homes.  Non-smoker.  No history of exposure to chemicals or pesticides. - Mother died of pancreatic cancer.  Father had prostate cancer.  Sister had breast cancer at age 93.  Brother had kidney cancer at age 4.   Plan: 1.  Stage III DLBCL: - PET scan from 12/29/2022: No evidence of lymphoma.  Mild parenchymal thickening in  the left upper lobe at the site of prior airspace disease consistent with postinfectious scarring/atelectasis. - She does not report any fevers, night sweats or weight loss. - She reports some dyspnea on exertion at nighttime while she is lying down. - She went to the ER at [facility].  A CT chest on 04/06/2023 reviewed by me shows mild groundglass attenuation throughout all lobes of the lungs.  Subcentimeter right upper lobe nodules 5 to 6 mm. - She was given prednisone for 12 days which made her feel better. - Reviewed labs today: Elevated AST and ALT from fatty liver stable.  LDH normal.  CBC normal. - Recommend 2D echo to evaluate for ejection fraction.  I will talk to her on the phone after the echo. - Recommend pulmonary evaluation. - She will come back in 3 months for follow-up with repeat labs.  I will also do CT CAP at that time.     Orders Placed This Encounter  Procedures   CT CHEST ABDOMEN PELVIS W CONTRAST    Standing Status:   Future    Standing Expiration Date:   04/25/2024    Order Specific Question:   If indicated for the ordered procedure, I authorize the administration of contrast media per Radiology protocol    Answer:   Yes    Order Specific Question:   Does the patient have a contrast media/X-ray dye allergy?    Answer:   No    Order Specific Question:   Preferred imaging location?    Answer:   [facility]    Order Specific Question:  Release to patient    Answer:   Immediate    Order Specific Question:   If indicated for the ordered procedure, I authorize the administration of oral contrast media per Radiology protocol    Answer:   Yes   CBC with Differential/Platelet    Standing Status:   Future    Standing Expiration Date:   04/25/2024    Order Specific Question:   Release to patient    Answer:   Immediate   Comprehensive metabolic panel    Standing Status:   Future    Standing Expiration Date:   04/25/2024    Order Specific Question:   Release  to patient    Answer:   Immediate   Lactate dehydrogenase    Standing Status:   Future    Standing Expiration Date:   04/25/2024    Order Specific Question:   Release to patient    Answer:   Immediate   ECHOCARDIOGRAM COMPLETE    Standing Status:   Future    Standing Expiration Date:   04/25/2024    Order Specific Question:   Where should this test be performed    Answer:   [facility]    Order Specific Question:   Perflutren DEFINITY (image enhancing agent) should be administered unless hypersensitivity or allergy exist    Answer:   Administer Perflutren    Order Specific Question:   Is a special reader required? (athlete or structural heart)    Answer:   No    Order Specific Question:   Does this study need to be read by the Structural team/Level 3 readers?    Answer:   No    Order Specific Question:   Reason for exam-Echo    Answer:   Chemo  Z09    Order Specific Question:   Reason for exam-Echo    Answer:   Dyspnea  R06.00    Order Specific Question:   Release to patient    Answer:   Immediate    Order Specific Question:   Other Comments    Answer:   Shortness of breath status post Chemo       I,Helena R Teague,acting as a scribe for Doreatha Massed, MD.,have documented all relevant documentation on the behalf of Doreatha Massed, MD,as directed by  Doreatha Massed, MD while in the presence of Doreatha Massed, MD.  I, Doreatha Massed MD, have reviewed the above documentation for accuracy and completeness, and I agree with the above.    Doreatha Massed, MD   7/31/20245:19 PM  CHIEF COMPLAINT:   Diagnosis: stage III large B-cell lymphoma    Cancer Staging  Diffuse large B cell lymphoma (HCC) Staging form: Hodgkin and Non-Hodgkin Lymphoma, AJCC 8th Edition - Clinical stage from 07/27/2022: Stage III (Diffuse large B-cell lymphoma) - Signed by Doreatha Massed, MD on 07/27/2022    Prior Therapy: none  Current Therapy:  Pola-R-CHP    HISTORY  OF PRESENT ILLNESS:   Oncology History  Diffuse large B cell lymphoma (HCC)  07/27/2022 Initial Diagnosis   Diffuse large B cell lymphoma (HCC)   07/27/2022 Cancer Staging   Staging form: Hodgkin and Non-Hodgkin Lymphoma, AJCC 8th Edition - Clinical stage from 07/27/2022: Stage III (Diffuse large B-cell lymphoma) - Signed by Doreatha Massed, MD on 07/27/2022 Histopathologic type: Malignant lymphoma, large B-cell, diffuse, NOS Stage prefix: Initial diagnosis   08/01/2022 -  Chemotherapy   Patient is on Treatment Plan : NON-HODGKINS LYMPHOMA Pola-R-CHP (Polatuzumab + Rituximab -CHP) q21d  INTERVAL HISTORY:   Debbie Hall is a 62 y.o. female presenting to clinic today for follow up of stage III large B-cell lymphoma. She was last seen by me on 04/06/23.  Today, she states that she is doing well overall. Her appetite level is at 75%. Her energy level is at 25%.  She does not have a pulmonologist. She had Covid in December of 2023. She reports she would wake up in the middle of the night and felt like she couldn't breathe in the middle of July. She reportedly had chest X-rays that were normal. She notes that on 7/10 at night she felt worsened SOB and her O2 saturation was reportedly in the 80's. She then went to the hospital for low O2 saturation. She had a CT on 7/11 while in the hospital that found ground glass in the lungs and was given an albuterol inhaler that was ineffective. She was then put on Prednisone for 12 days by her NP, as well as physical therapy for generalized weakness. She reports feeling better with Prednisone. She currently can feel a flare up of SOB coming. She denies wheezing or coughing sputum.   PAST MEDICAL HISTORY:   Past Medical History: Past Medical History:  Diagnosis Date   Anemia    Anxiety    Diabetes mellitus (HCC)    Fatty liver    GERD (gastroesophageal reflux disease)    Gout    Hyperlipidemia    Hypertension    Lymphoma (HCC)    Migraines     Pneumonia    as a child   Port-A-Cath in place 07/28/2022    Surgical History: Past Surgical History:  Procedure Laterality Date   ABDOMINAL HYSTERECTOMY     partial   CESAREAN SECTION     x 2   TONSILLECTOMY      Social History: Social History   Socioeconomic History   Marital status: Married    Spouse name: Not on file   Number of children: 4   Years of education: Not on file   Highest education level: Not on file  Occupational History   Occupation: Interior and spatial designer of Nursing    Comment: [employer]  Tobacco Use   Smoking status: Never   Smokeless tobacco: Never  Vaping Use   Vaping status: Never Used  Substance and Sexual Activity   Alcohol use: Yes    Comment: occasional   Drug use: Never   Sexual activity: Not on file  Other Topics Concern   Not on file  Social History Narrative   Not on file   Social Determinants of Health   Financial Resource Strain: Not on file  Food Insecurity: No Food Insecurity (09/21/2022)   Hunger Vital Sign    Worried About Running Out of Food in the Last Year: Never true    Ran Out of Food in the Last Year: Never true  Transportation Needs: No Transportation Needs (09/21/2022)   PRAPARE - Administrator, Civil Service (Medical): No    Lack of Transportation (Non-Medical): No  Physical Activity: Not on file  Stress: Not on file  Social Connections: Not on file  Intimate Partner Violence: Not At Risk (09/21/2022)   Humiliation, Afraid, Rape, and Kick questionnaire    Fear of Current or Ex-Partner: No    Emotionally Abused: No    Physically Abused: No    Sexually Abused: No    Family History: Family History  Problem Relation Age of Onset   Hypertension Mother  Heart disease Mother    Heart attack Mother    Pancreatic cancer Mother    Prostate cancer Father    Colon cancer Neg Hx    Esophageal cancer Neg Hx    Rectal cancer Neg Hx     Current Medications:  Current Outpatient Medications:     ALPRAZolam (XANAX) 1 MG tablet, Take 1 mg by mouth 3 (three) times daily as needed for anxiety or sleep., Disp: , Rfl:    escitalopram (LEXAPRO) 5 MG tablet, Take 1 tablet by mouth daily., Disp: , Rfl:    ibuprofen (ADVIL) 200 MG tablet, Take 600 mg by mouth every 6 (six) hours as needed for moderate pain., Disp: , Rfl:    Allergies: Allergies  Allergen Reactions   Avocado Itching   Banana Itching   Latex Itching    Eyes watering, itching.   Lisinopril     angioedema   Penicillins     Facial swelling   Polivy [Polatuzumab Vedotin] Other (See Comments)    Dry throat and trouble catching her breath.   Rituxan [Rituximab]     Itching in her throat    Vancomycin Itching    REVIEW OF SYSTEMS:   Review of Systems  Constitutional:  Negative for chills, fatigue and fever.  HENT:   Negative for lump/mass, mouth sores, nosebleeds, sore throat and trouble swallowing.   Eyes:  Negative for eye problems.  Respiratory:  Positive for cough and shortness of breath.   Cardiovascular:  Negative for chest pain, leg swelling and palpitations.  Gastrointestinal:  Negative for abdominal pain, constipation, diarrhea, nausea and vomiting.  Genitourinary:  Negative for bladder incontinence, difficulty urinating, dysuria, frequency, hematuria and nocturia.   Musculoskeletal:  Negative for arthralgias, back pain, flank pain, myalgias and neck pain.  Skin:  Negative for itching and rash.  Neurological:  Negative for dizziness, headaches and numbness.  Hematological:  Does not bruise/bleed easily.  Psychiatric/Behavioral:  Positive for depression. Negative for sleep disturbance and suicidal ideas. The patient is nervous/anxious.   All other systems reviewed and are negative.    VITALS:   There were no vitals taken for this visit.  Wt Readings from Last 3 Encounters:  04/26/23 175 lb 12.8 oz (79.7 kg)  11/21/22 169 lb (76.7 kg)  10/31/22 166 lb 3.2 oz (75.4 kg)    There is no height or weight  on file to calculate BMI.  Performance status (ECOG): 0 - Asymptomatic  PHYSICAL EXAM:   Physical Exam Vitals and nursing note reviewed. Exam conducted with a chaperone present.  Constitutional:      Appearance: Normal appearance.  Cardiovascular:     Rate and Rhythm: Normal rate and regular rhythm.     Pulses: Normal pulses.     Heart sounds: Normal heart sounds.  Pulmonary:     Effort: Pulmonary effort is normal.     Breath sounds: Normal breath sounds.  Abdominal:     Palpations: Abdomen is soft. There is no hepatomegaly, splenomegaly or mass.     Tenderness: There is no abdominal tenderness.  Musculoskeletal:     Right lower leg: No edema.     Left lower leg: No edema.  Lymphadenopathy:     Cervical: No cervical adenopathy.     Right cervical: No superficial, deep or posterior cervical adenopathy.    Left cervical: No superficial, deep or posterior cervical adenopathy.     Upper Body:     Right upper body: No supraclavicular or axillary adenopathy.  Left upper body: No supraclavicular or axillary adenopathy.  Neurological:     General: No focal deficit present.     Mental Status: She is alert and oriented to person, place, and time.  Psychiatric:        Mood and Affect: Mood normal.        Behavior: Behavior normal.     LABS:      Latest Ref Rng & Units 04/26/2023    1:24 PM 01/05/2023   11:07 AM 11/21/2022    8:03 AM  CBC  WBC 4.0 - 10.5 K/uL 5.4  4.8  3.5   Hemoglobin 12.0 - 15.0 g/dL 91.4  78.2  9.6   Hematocrit 36.0 - 46.0 % 41.2  38.8  30.4   Platelets 150 - 400 K/uL 168  187  193       Latest Ref Rng & Units 04/26/2023    1:24 PM 01/05/2023   11:07 AM 11/21/2022    8:03 AM  CMP  Glucose 70 - 99 mg/dL 94  99  956   BUN 8 - 23 mg/dL 18  13  15    Creatinine 0.44 - 1.00 mg/dL 2.13  0.86  5.78   Sodium 135 - 145 mmol/L 137  138  137   Potassium 3.5 - 5.1 mmol/L 4.4  4.3  4.0   Chloride 98 - 111 mmol/L 106  104  107   CO2 22 - 32 mmol/L 23  24  22     Calcium 8.9 - 10.3 mg/dL 8.8  8.9  8.3   Total Protein 6.5 - 8.1 g/dL 6.4  6.8  6.0   Total Bilirubin 0.3 - 1.2 mg/dL 0.5  0.7  0.5   Alkaline Phos 38 - 126 U/L 98  71  74   AST 15 - 41 U/L 98  126  39   ALT 0 - 44 U/L 104  100  29      No results found for: "CEA1", "CEA" / No results found for: "CEA1", "CEA" No results found for: "PSA1" No results found for: "ION629" No results found for: "CAN125"  No results found for: "TOTALPROTELP", "ALBUMINELP", "A1GS", "A2GS", "BETS", "BETA2SER", "GAMS", "MSPIKE", "SPEI" Lab Results  Component Value Date   TIBC 379 09/07/2020   FERRITIN 252.9 09/07/2020   IRONPCTSAT 25.6 09/07/2020   IRONPCTSAT 25 09/07/2020   Lab Results  Component Value Date   LDH 184 04/26/2023   LDH 184 01/05/2023   LDH 200 (H) 08/23/2022     STUDIES:   No results found.

## 2023-04-26 NOTE — Patient Instructions (Addendum)
[facility]  Discharge Instructions  You were seen and examined today by Dr. Ellin Saba.  Dr. Ellin Saba discussed your most recent lab work and CT scan which revealed everything looks good and stable.  Follow-up as scheduled.    Thank you for choosing [facility] to provide your oncology and hematology care.   To afford each patient quality time with our provider, please arrive at least 15 minutes before your scheduled appointment time. You may need to reschedule your appointment if you arrive late (10 or more minutes). Arriving late affects you and other patients whose appointments are after yours.  Also, if you miss three or more appointments without notifying the office, you may be dismissed from the clinic at the provider's discretion.    Again, thank you for choosing [facility].  Our hope is that these requests will decrease the amount of time that you wait before being seen by our physicians.   If you have a lab appointment with the Cancer Center - please note that after April 8th, all labs will be drawn in the cancer center.  You do not have to check in or register with the main entrance as you have in the past but will complete your check-in at the cancer center.            _____________________________________________________________  Should you have questions after your visit to [facility], please contact our office at [phone] and follow the prompts.  Our office hours are 8:00 a.m. to 4:30 p.m. Monday - Thursday and 8:00 a.m. to 2:30 p.m. Friday.  Please note that voicemails left after 4:00 p.m. may not be returned until the following business day.  We are closed weekends and all major holidays.  You do have access to a nurse 24-7, just call the main number to the clinic [phone] and do not press any options, hold on the line and a nurse will answer the phone.    For prescription refill  requests, have your pharmacy contact our office and allow 72 hours.    Masks are no longer required in the cancer centers. If you would like for your care team to wear a mask while they are taking care of you, please let them know. You may have one support person who is at least 64 years old accompany you for your appointments.

## 2023-04-27 ENCOUNTER — Other Ambulatory Visit: Payer: Self-pay

## 2023-05-03 ENCOUNTER — Ambulatory Visit (HOSPITAL_COMMUNITY): Admission: RE | Admit: 2023-05-03 | Payer: Managed Care, Other (non HMO) | Source: Ambulatory Visit

## 2023-05-11 ENCOUNTER — Inpatient Hospital Stay: Payer: Managed Care, Other (non HMO) | Admitting: Hematology

## 2023-05-12 ENCOUNTER — Ambulatory Visit (HOSPITAL_COMMUNITY)
Admission: RE | Admit: 2023-05-12 | Discharge: 2023-05-12 | Disposition: A | Discharge status: Home / Self Care | Payer: Managed Care, Other (non HMO) | Source: Ambulatory Visit | Attending: Hematology | Admitting: Hematology

## 2023-05-12 DIAGNOSIS — C8338 Diffuse large B-cell lymphoma, lymph nodes of multiple sites: Secondary | ICD-10-CM

## 2023-05-12 DIAGNOSIS — Z5111 Encounter for antineoplastic chemotherapy: Secondary | ICD-10-CM | POA: Diagnosis not present

## 2023-05-12 DIAGNOSIS — I10 Essential (primary) hypertension: Secondary | ICD-10-CM | POA: Diagnosis not present

## 2023-05-12 DIAGNOSIS — R0602 Shortness of breath: Secondary | ICD-10-CM | POA: Diagnosis not present

## 2023-05-12 DIAGNOSIS — E119 Type 2 diabetes mellitus without complications: Secondary | ICD-10-CM | POA: Insufficient documentation

## 2023-05-12 DIAGNOSIS — I340 Nonrheumatic mitral (valve) insufficiency: Secondary | ICD-10-CM | POA: Diagnosis not present

## 2023-05-12 LAB — ECHOCARDIOGRAM COMPLETE
Area-P 1/2: 3.53 cm2
Calc EF: 58.3 %
S' Lateral: 3 cm
Single Plane A2C EF: 58.4 %
Single Plane A4C EF: 58.4 %

## 2023-05-12 NOTE — Progress Notes (Signed)
  Echocardiogram 2D Echocardiogram has been performed.  Debbie Hall 05/12/2023, 3:54 PM

## 2023-05-18 ENCOUNTER — Other Ambulatory Visit: Payer: Self-pay

## 2023-05-18 ENCOUNTER — Inpatient Hospital Stay: Payer: Managed Care, Other (non HMO) | Attending: Hematology | Admitting: Hematology

## 2023-05-18 ENCOUNTER — Encounter: Payer: Self-pay | Admitting: Hematology

## 2023-05-18 DIAGNOSIS — C8338 Diffuse large B-cell lymphoma, lymph nodes of multiple sites: Secondary | ICD-10-CM

## 2023-05-18 NOTE — Progress Notes (Signed)
Virtual Visit via Telephone Note  I connected with Debbie Hall on 05/18/23 at  4:00 PM EDT by telephone and verified that I am speaking with the correct person using two identifiers.  Location: Patient: At home Provider: At office   I discussed the limitations, risks, security and privacy concerns of performing an evaluation and management service by telephone and the availability of in person appointments. I also discussed with the patient that there may be a patient responsible charge related to this service. The patient expressed understanding and agreed to proceed.   History of Present Illness: Debbie Hall is a 62 y.o. female called to a tele-med visit for diffuse large B-cell lymphoma of lymph nodes of multiple regions. She recently had a 2D echo on 05/12/23 to evaluate for ejection fraction. She was last seen by me on 04/26/23.    Observations/Objective: She reports improvement in breathing after she finished her third course of steroids given by her PMD.  Denies any orthopnea.  Assessment and Plan:  1.  Stage III DLBCL: - She completed third course of steroids last week.  She reports her breathing has improved. - I reviewed echocardiogram from 05/12/2023: EF 55-60%.  I compared it with echo from 06/28/2022: LVEF 60-65%.  Even though it is a mild drop in EF, still within the normal range. - She has an appointment to see pulmonology end of September.   Follow Up Instructions: RTC 3 months with labs and scan.   I discussed the assessment and treatment plan with the patient. The patient was provided an opportunity to ask questions and all were answered. The patient agreed with the plan and demonstrated an understanding of the instructions.   The patient was advised to call back or seek an in-person evaluation if the symptoms worsen or if the condition fails to improve as anticipated.  I provided 14 minutes of non-face-to-face time during this encounter.   [employer]

## 2023-06-25 NOTE — Progress Notes (Unsigned)
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  Stage III DLBCL: - CT soft tissue neck (06/06/2022): Left supraclavicular mass 1.9 x 1.6 cm central hypodensity.  Smaller adjacent nodule present.  Oval-shaped mass anterior to the left IJ vein 2.9 x 1.8 cm.  No definite 2 exophytic oropharyngeal or hypopharyngeal mass noted.  Thyroid intact.  Lung bases clear. - Left supraclavicular lymph node biopsy by Dr. Marcha Solders on 06/10/2022 - Pathology: Atypical B-cell lymphoid infiltrate highly concerning for a lymphoproliferative disorder, possibly high-grade in the background of extensive granulomatous type inflammation and necrosis. - 2D echo (06/28/2022): LVEF 60-65%. - PET scan (07/07/2022): Markedly hypermetabolic adenopathy in the neck, chest, pelvis and inguinal regions.  Few subcentimeter pulmonary nodules are too small for PET resolution.  Hepatic steatosis. - Right inguinal lymph node biopsy (07/20/2022): Large B-cell lymphoma with Ki-67 70%.  FISH studies for high risk lymphoma sent. - IPI score-3 (age more than 60, stage III, elevated LDH) - 2D echo (06/28/2022): EF 60 to 65%. - 6 cycles Pola-R-CHP from 08/01/2022 through 11/21/2022.      History of Present Illness  06/26/2023  Pulmonary/ 1st office eval/Damarrion Mimbs maint on symb 80 2 pffs in am shortly p rising  Chief Complaint  Patient presents with   Consult    Sob ( 10 months ) pt is using symbicort but feels its not working for her. Has never been diagnosed with asthma. Pt is done with chemo started while on chemo   Baseline prior to dx of lymphoma was walking 2 miles on a treadmill in [place] - last did this one year prior to OV   July 11th 2024 sats lowest = 80s with w/u  CT [facility] =  CT c/w  GG changes p last chemo feb 26th 2024  Dyspnea: 3 aisles at [employer] slow pace  is her limit, not checking sats at peak ex  Cough: congested sounding cough > not productive- no better on inhalers but technique is very poor  Sleep: flat bed/ 3 pillows and half way thru night wakes up nightly  SABA use: twice daily, not really helping  Prednisone works great while on 3 sep courses =  12 day 5 days 5 days then sporadic    No obvious day to day or daytime pattern/variability or assoc excess/ purulent sputum or mucus plugs or hemoptysis or cp or chest tightness, subjective wheeze or overt sinus or hb symptoms.    Also denies any obvious fluctuation of symptoms with weather or environmental changes or other aggravating or alleviating factors except as outlined above   No unusual exposure hx or h/o childhood pna/ asthma or knowledge of premature birth.  Current Allergies, Complete Past Medical History, Past Surgical History, Family History, and Social History were reviewed in [employer] record.  ROS  The following are not active complaints unless bolded Hoarseness, sore throat, dysphagia, dental problems, itching, sneezing,  nasal congestion or discharge of excess mucus or purulent secretions, ear ache,   fever, chills, sweats, unintended wt loss or wt gain, classically pleuritic or exertional cp,  orthopnea pnd or arm/hand swelling  or leg swelling, presyncope, palpitations, abdominal pain, anorexia, nausea, vomiting, diarrhea  or change in bowel habits or change in bladder habits, change in stools or  change in urine, dysuria, hematuria,  rash, arthralgias, visual complaints, headache, numbness, weakness or ataxia or problems with walking or coordination,  change in mood or  memory.             Outpatient Medications Prior to Visit  Medication Sig Dispense Refill   ALPRAZolam (XANAX) 1 MG tablet Take 1 mg by mouth 3 (three) times daily as needed for anxiety or sleep.     escitalopram (LEXAPRO) 5 MG tablet Take 1 tablet by mouth daily.     ibuprofen  (ADVIL) 200 MG tablet Take 600 mg by mouth every 6 (six) hours as needed for moderate pain.     metoprolol tartrate (LOPRESSOR) 25 MG tablet Take 25 mg by mouth 2 (two) times daily.     SYMBICORT 80-4.5 MCG/ACT inhaler Inhale 2 puffs into the lungs daily.     No facility-administered medications prior to visit.    Past Medical History:  Diagnosis Date   Anemia    Anxiety    Diabetes mellitus (HCC)    Fatty liver    GERD (gastroesophageal reflux disease)    Gout    Hyperlipidemia    Hypertension    Lymphoma (HCC)    Migraines    Pneumonia    as a child   Port-A-Cath in place 07/28/2022      Objective:     BP (!) 149/93   Pulse 90   Ht 5\' 5"  (1.651 m)   Wt 177 lb 6.4 oz (80.5 kg)   SpO2 95%   BMI 29.52 kg/m   SpO2: 95 %  RA   Amb somber wf nad   HEENT : Oropharynx  clear      Nasal turbinates nl    NECK :  without  apparent JVD/ palpable Nodes/TM    LUNGS: no acc muscle use,  Nl contour chest which is clear to A and P bilaterally with harsh dry cough on exp maneuvers   CV:  RRR  no s3 or murmur or increase in P2, and no edema   ABD:  soft and nontender with nl inspiratory excursion in the supine position. No bruits or organomegaly appreciated   MS:  Nl gait/ ext warm without deformities Or obvious joint restrictions  calf tenderness, cyanosis or clubbing    SKIN: warm and dry without lesions    NEURO:  alert, approp, nl sensorium with  no motor or cerebellar deficits apparent.       Assessment   DOE (dyspnea on exertion) 6 cycles Pola-R-CHP from 08/01/2022 through 11/21/2022 ? Onset of doe as gave up treadmill walking at dx  - echo 05/12/23  Mild MR and Grade 1 diastolic dysfunction  - peak DOE April 06 2023 with desats resting > [place] ct c/w GG > rx short term prednisone only  - 06/26/2023   Walked on   x  3   lap(s) =  approx 750  ft  @ mod pace, stopped due to sob with lowest 02 sats 92%   - 06/26/2023  After extensive coaching inhaler device,   effectiveness =    50% with  baseline= 25 % so continue symbicort 80 - 06/26/2023 rx Prednisone 10 mg  2 each am with breakfast until better then 1 daily x one 1 week then half daily until seen then  one half daily   Miscellaneous:Alv microlithiasis, alv proteinosis, asp, bronchiectais, BOOP   ARDS/ AIP Occupational dz/ HSP Neoplasm> neg PET reassuring  12/29/22  Infection> nothing at  all to suggest Drug > mostly likely culprit here statistically though timing is unusual  Pulmonary emboli, Protein disorders Edema/Eosinophilic dz> check bnp /eos to be complete Sarcoidosis Connective tissue dz Hist X / Hemorrhage Idiopathic    Rx pred as above plus  Make sure you check your oxygen saturation at your highest level of activity(NOT after you stop)  to be sure it stays over 90% and keep track of it at least once a week, more often if breathing getting worse, and let me know if losing ground. (Collect the dots to connect the dots approach)   Max rx for gerd Continue symbicort 80 2bid but work on technique   >>> f/u in 4 weeks in [facility] with all meds in hand using a trust but verify approach to confirm accurate Medication  Reconciliation The principal here is that until we are certain that the  patients are doing what we've asked, it makes no sense to ask them to do more.   Each maintenance medication was reviewed in detail including emphasizing most importantly the difference between maintenance and prns and under what circumstances the prns are to be triggered using an action plan format where appropriate.  Total time for H and P, chart review, counseling, reviewing hfa/pulse ox device(s) , directly observing portions of ambulatory 02 saturation study/ and generating customized AVS unique to this office visit / same day charting > 60 min complex new pt eval.                   Sandrea Hughs, MD 06/26/2023

## 2023-06-26 ENCOUNTER — Ambulatory Visit (INDEPENDENT_AMBULATORY_CARE_PROVIDER_SITE_OTHER): Payer: Managed Care, Other (non HMO) | Admitting: Internal Medicine

## 2023-06-26 ENCOUNTER — Encounter: Payer: Self-pay | Admitting: Internal Medicine

## 2023-06-26 ENCOUNTER — Other Ambulatory Visit (INDEPENDENT_AMBULATORY_CARE_PROVIDER_SITE_OTHER): Payer: Managed Care, Other (non HMO)

## 2023-06-26 VITALS — BP 149/93 | HR 90 | Ht 65.0 in | Wt 177.4 lb

## 2023-06-26 DIAGNOSIS — R0609 Other forms of dyspnea: Secondary | ICD-10-CM | POA: Insufficient documentation

## 2023-06-26 LAB — CBC WITH DIFFERENTIAL/PLATELET
Basophils Absolute: 0 10*3/uL (ref 0.0–0.1)
Basophils Relative: 0.8 % (ref 0.0–3.0)
Eosinophils Absolute: 0.3 10*3/uL (ref 0.0–0.7)
Eosinophils Relative: 5.3 % — ABNORMAL HIGH (ref 0.0–5.0)
HCT: 43.1 % (ref 36.0–46.0)
Hemoglobin: 14.1 g/dL (ref 12.0–15.0)
Lymphocytes Relative: 7 % — ABNORMAL LOW (ref 12.0–46.0)
Lymphs Abs: 0.4 10*3/uL — ABNORMAL LOW (ref 0.7–4.0)
MCHC: 32.7 g/dL (ref 30.0–36.0)
MCV: 90.2 fL (ref 78.0–100.0)
Monocytes Absolute: 0.5 10*3/uL (ref 0.1–1.0)
Monocytes Relative: 9.5 % (ref 3.0–12.0)
Neutro Abs: 4.2 10*3/uL (ref 1.4–7.7)
Neutrophils Relative %: 77.4 % — ABNORMAL HIGH (ref 43.0–77.0)
Platelets: 233 10*3/uL (ref 150.0–400.0)
RBC: 4.77 Mil/uL (ref 3.87–5.11)
RDW: 13.7 % (ref 11.5–15.5)
WBC: 5.4 10*3/uL (ref 4.0–10.5)

## 2023-06-26 LAB — TSH: TSH: 3.06 u[IU]/mL (ref 0.35–5.50)

## 2023-06-26 LAB — BRAIN NATRIURETIC PEPTIDE: Pro B Natriuretic peptide (BNP): 16 pg/mL (ref 0.0–100.0)

## 2023-06-26 LAB — SEDIMENTATION RATE: Sed Rate: 28 mm/h (ref 0–30)

## 2023-06-26 MED ORDER — PREDNISONE 10 MG PO TABS
ORAL_TABLET | ORAL | 0 refills | Status: DC
Start: 2023-06-26 — End: 2024-08-06

## 2023-06-26 MED ORDER — PANTOPRAZOLE SODIUM 40 MG PO TBEC: 40.0000 mg | DELAYED_RELEASE_TABLET | Freq: Every day | ORAL | 2 refills | Status: DC

## 2023-06-26 MED ORDER — FAMOTIDINE 20 MG PO TABS: ORAL_TABLET | ORAL | 11 refills | Status: AC

## 2023-06-26 NOTE — Assessment & Plan Note (Addendum)
6 cycles Pola-R-CHP from 08/01/2022 through 11/21/2022 ? Onset of doe as gave up treadmill walking at dx  - echo 05/12/23  Mild MR and Grade 1 diastolic dysfunction  - peak DOE April 06 2023 with desats resting > [place] ct c/w GG > rx short term prednisone only  - 06/26/2023   Walked on   x  3   lap(s) =  approx 750  ft  @ mod pace, stopped due to sob with lowest 02 sats 92%   - 06/26/2023  After extensive coaching inhaler device,  effectiveness =    50% with  baseline= 25 % so continue symbicort 80 - 06/26/2023 rx Prednisone 10 mg  2 each am with breakfast until better then 1 daily x one 1 week then half daily until seen then  one half daily   Miscellaneous:Alv microlithiasis, alv proteinosis, asp, bronchiectais, BOOP   ARDS/ AIP Occupational dz/ HSP Neoplasm> neg PET reassuring  12/29/22  Infection> nothing at all to suggest Drug > mostly likely culprit here statistically though timing is unusual  Pulmonary emboli, Protein disorders Edema/Eosinophilic dz> check bnp /eos to be complete Sarcoidosis Connective tissue dz Hist X / Hemorrhage Idiopathic    Rx pred as above plus  Make sure you check your oxygen saturation at your highest level of activity(NOT after you stop)  to be sure it stays over 90% and keep track of it at least once a week, more often if breathing getting worse, and let me know if losing ground. (Collect the dots to connect the dots approach)   Max rx for gerd Continue symbicort 80 2bid but work on technique   >>> f/u in 4 weeks in [facility] with all meds in hand using a trust but verify approach to confirm accurate Medication  Reconciliation The principal here is that until we are certain that the  patients are doing what we've asked, it makes no sense to ask them to do more.   Each maintenance medication was reviewed in detail including emphasizing most importantly the difference between maintenance and prns and under what circumstances the prns are to be triggered using an action  plan format where appropriate.  Total time for H and P, chart review, counseling, reviewing hfa/pulse ox device(s) , directly observing portions of ambulatory 02 saturation study/ and generating customized AVS unique to this office visit / same day charting > 60 min complex new pt eval.

## 2023-06-26 NOTE — Patient Instructions (Addendum)
GERD (REFLUX)  is an extremely common cause of respiratory symptoms just like yours , many times with no obvious heartburn at all.    It can be treated with medication, but also with lifestyle changes including elevation of the head of your bed (ideally with 6 -8inch blocks under the headboard of your bed),  Smoking cessation, avoidance of late meals, excessive alcohol, and avoid fatty foods, chocolate, peppermint, colas, red wine, and acidic juices such as orange juice.  NO MINT OR MENTHOL PRODUCTS SO NO COUGH DROPS  USE SUGARLESS CANDY INSTEAD (Jolley ranchers or Stover's or Life Savers) or even ice chips will also do - the key is to swallow to prevent all throat clearing. NO OIL BASED VITAMINS - use powdered substitutes.  Avoid fish oil when coughing.   Pantoprazole (protonix) 40 mg   Take  30-60 min before first meal of the day and Pepcid (famotidine)  20 mg after supper until return to office - this is the best way to tell whether stomach acid is contributing to your problem.     Prednisone 10 mg  2 each am with breakfast until better then 1 daily x one 1 week then half daily until seen then  one half daily   Please remember to go to the lab department   for your tests - we will call you with the results when they are available.  Please schedule a follow up office visit in 4 weeks, sooner if needed  with all medications /inhalers/ solutions in hand so we can verify exactly what you are taking. This includes all medications from all doctors and over the counters  -[facility] office

## 2023-06-27 ENCOUNTER — Encounter: Payer: Self-pay | Admitting: Hematology

## 2023-06-27 ENCOUNTER — Other Ambulatory Visit: Payer: Self-pay

## 2023-06-27 LAB — BASIC METABOLIC PANEL WITH GFR
BUN: 15 mg/dL (ref 6–23)
CO2: 21 meq/L (ref 19–32)
Calcium: 9.5 mg/dL (ref 8.4–10.5)
Chloride: 106 meq/L (ref 96–112)
Creatinine, Ser: 0.76 mg/dL (ref 0.40–1.20)
GFR: 83.02 mL/min
Glucose, Bld: 112 mg/dL — ABNORMAL HIGH (ref 70–99)
Potassium: 3.9 meq/L (ref 3.5–5.1)
Sodium: 140 meq/L (ref 135–145)

## 2023-06-27 LAB — D-DIMER, QUANTITATIVE: D-Dimer, Quant: 0.34 ug{FEU}/mL (ref <0.50)

## 2023-07-20 ENCOUNTER — Ambulatory Visit (HOSPITAL_COMMUNITY)
Admission: RE | Admit: 2023-07-20 | Discharge: 2023-07-20 | Disposition: A | Discharge status: Home / Self Care | Payer: Managed Care, Other (non HMO) | Source: Ambulatory Visit | Attending: Hematology | Admitting: Hematology

## 2023-07-20 ENCOUNTER — Other Ambulatory Visit: Payer: Managed Care, Other (non HMO)

## 2023-07-20 ENCOUNTER — Inpatient Hospital Stay: Payer: Managed Care, Other (non HMO) | Attending: Hematology

## 2023-07-20 DIAGNOSIS — C8338 Diffuse large B-cell lymphoma, lymph nodes of multiple sites: Secondary | ICD-10-CM | POA: Insufficient documentation

## 2023-07-20 LAB — CBC WITH DIFFERENTIAL/PLATELET
Abs Immature Granulocytes: 0.06 10*3/uL (ref 0.00–0.07)
Basophils Absolute: 0.1 10*3/uL (ref 0.0–0.1)
Basophils Relative: 2 %
Eosinophils Absolute: 0.2 10*3/uL (ref 0.0–0.5)
Eosinophils Relative: 6 %
HCT: 39 % (ref 36.0–46.0)
Hemoglobin: 12.7 g/dL (ref 12.0–15.0)
Immature Granulocytes: 2 %
Lymphocytes Relative: 10 %
Lymphs Abs: 0.4 10*3/uL — ABNORMAL LOW (ref 0.7–4.0)
MCH: 30.5 pg (ref 26.0–34.0)
MCHC: 32.6 g/dL (ref 30.0–36.0)
MCV: 93.5 fL (ref 80.0–100.0)
Monocytes Absolute: 0.3 10*3/uL (ref 0.1–1.0)
Monocytes Relative: 9 %
Neutro Abs: 2.6 10*3/uL (ref 1.7–7.7)
Neutrophils Relative %: 71 %
Platelets: 169 10*3/uL (ref 150–400)
RBC: 4.17 MIL/uL (ref 3.87–5.11)
RDW: 13.3 % (ref 11.5–15.5)
WBC: 3.6 10*3/uL — ABNORMAL LOW (ref 4.0–10.5)
nRBC: 0 % (ref 0.0–0.2)

## 2023-07-20 LAB — COMPREHENSIVE METABOLIC PANEL
ALT: 78 U/L — ABNORMAL HIGH (ref 0–44)
AST: 103 U/L — ABNORMAL HIGH (ref 15–41)
Albumin: 3.9 g/dL (ref 3.5–5.0)
Alkaline Phosphatase: 78 U/L (ref 38–126)
Anion gap: 13 (ref 5–15)
BUN: 9 mg/dL (ref 8–23)
CO2: 22 mmol/L (ref 22–32)
Calcium: 9 mg/dL (ref 8.9–10.3)
Chloride: 105 mmol/L (ref 98–111)
Creatinine, Ser: 0.65 mg/dL (ref 0.44–1.00)
GFR, Estimated: >60 mL/min (ref >60)
Glucose, Bld: 112 mg/dL — ABNORMAL HIGH (ref 70–99)
Potassium: 4.1 mmol/L (ref 3.5–5.1)
Sodium: 140 mmol/L (ref 135–145)
Total Bilirubin: 0.9 mg/dL (ref 0.3–1.2)
Total Protein: 6.6 g/dL (ref 6.5–8.1)

## 2023-07-20 LAB — LACTATE DEHYDROGENASE: LDH: 228 U/L — ABNORMAL HIGH (ref 98–192)

## 2023-07-20 MED ORDER — IOHEXOL 9 MG/ML PO SOLN
ORAL | Status: AC
  Filled 2023-07-20: qty 500

## 2023-07-20 MED ORDER — SODIUM CHLORIDE 0.9% FLUSH
10.0000 mL | Freq: Once | INTRAVENOUS | Status: AC
  Administered 2023-07-20: 10 mL via INTRAVENOUS

## 2023-07-20 MED ORDER — HEPARIN SOD (PORK) LOCK FLUSH 100 UNIT/ML IV SOLN
INTRAVENOUS | Status: AC
  Filled 2023-07-20: qty 5

## 2023-07-20 MED ORDER — IOHEXOL 300 MG/ML  SOLN
100.0000 mL | Freq: Once | INTRAMUSCULAR | Status: AC | PRN
  Administered 2023-07-20: 100 mL via INTRAVENOUS

## 2023-07-20 MED ORDER — HEPARIN SOD (PORK) LOCK FLUSH 100 UNIT/ML IV SOLN
500.0000 [IU] | Freq: Once | INTRAVENOUS | Status: AC
  Administered 2023-07-20: 500 [IU] via INTRAVENOUS

## 2023-07-20 NOTE — Patient Instructions (Signed)
[facility]  Discharge Instructions: Thank you for choosing [facility] to provide your oncology and hematology care.  If you have a lab appointment with the Cancer Center - please note that after April 8th, 2024, all labs will be drawn in the cancer center.  You do not have to check in or register with the main entrance as you have in the past but will complete your check-in in the cancer center.  Wear comfortable clothing and clothing appropriate for easy access to any Portacath or PICC line.   We strive to give you quality time with your provider. You may need to reschedule your appointment if you arrive late (15 or more minutes).  Arriving late affects you and other patients whose appointments are after yours.  Also, if you miss three or more appointments without notifying the office, you may be dismissed from the clinic at the provider's discretion.      For prescription refill requests, have your pharmacy contact our office and allow 72 hours for refills to be completed.    Today you received the following chemotherapy and/or immunotherapy agents Port flush labs      To help prevent nausea and vomiting after your treatment, we encourage you to take your nausea medication as directed.  BELOW ARE SYMPTOMS THAT SHOULD BE REPORTED IMMEDIATELY: *FEVER GREATER THAN 100.4 F (38 C) OR HIGHER *CHILLS OR SWEATING *NAUSEA AND VOMITING THAT IS NOT CONTROLLED WITH YOUR NAUSEA MEDICATION *UNUSUAL SHORTNESS OF BREATH *UNUSUAL BRUISING OR BLEEDING *URINARY PROBLEMS (pain or burning when urinating, or frequent urination) *BOWEL PROBLEMS (unusual diarrhea, constipation, pain near the anus) TENDERNESS IN MOUTH AND THROAT WITH OR WITHOUT PRESENCE OF ULCERS (sore throat, sores in mouth, or a toothache) UNUSUAL RASH, SWELLING OR PAIN  UNUSUAL VAGINAL DISCHARGE OR ITCHING   Items with * indicate a potential emergency and should be followed up as soon as possible or go to  the Emergency Department if any problems should occur.  Please show the CHEMOTHERAPY ALERT CARD or IMMUNOTHERAPY ALERT CARD at check-in to the Emergency Department and triage nurse.  Should you have questions after your visit or need to cancel or reschedule your appointment, please contact [facility] [phone]  and follow the prompts.  Office hours are 8:00 a.m. to 4:30 p.m. Monday - Friday. Please note that voicemails left after 4:00 p.m. may not be returned until the following business day.  We are closed weekends and major holidays. You have access to a nurse at all times for urgent questions. Please call the main number to the clinic [phone] and follow the prompts.  For any non-urgent questions, you may also contact your provider using MyChart. We now offer e-Visits for anyone 6 and older to request care online for non-urgent symptoms. For details visit [email or web address].   Also download the MyChart app! Go to the app store, search "MyChart", open the app, select [facility], and log in with your MyChart username and password.

## 2023-07-20 NOTE — Progress Notes (Signed)
Patients port flushed without difficulty.  Good blood return noted with no bruising or swelling noted at site.  Patient to remain accessed for CT.

## 2023-07-27 ENCOUNTER — Inpatient Hospital Stay: Payer: Managed Care, Other (non HMO) | Admitting: Hematology

## 2023-07-27 VITALS — BP 144/85 | HR 98 | Temp 97.6°F | Resp 20 | Wt 185.2 lb

## 2023-07-27 DIAGNOSIS — C8590 Non-Hodgkin lymphoma, unspecified, unspecified site: Secondary | ICD-10-CM | POA: Diagnosis not present

## 2023-07-27 DIAGNOSIS — C8338 Diffuse large B-cell lymphoma, lymph nodes of multiple sites: Secondary | ICD-10-CM | POA: Diagnosis not present

## 2023-07-27 DIAGNOSIS — C8330 Diffuse large B-cell lymphoma, unspecified site: Secondary | ICD-10-CM | POA: Insufficient documentation

## 2023-07-27 NOTE — Patient Instructions (Signed)
[facility]  Discharge Instructions  You were seen and examined today by Dr. Ellin Saba.  Dr. Ellin Saba discussed your most recent lab work and CT scan which revealed that everything looks good and stable.  Dr. Ellin Saba is going to repeat the scan and labs before your next appointment.  Follow-up as scheduled in 6 months.    Thank you for choosing [facility] to provide your oncology and hematology care.   To afford each patient quality time with our provider, please arrive at least 15 minutes before your scheduled appointment time. You may need to reschedule your appointment if you arrive late (10 or more minutes). Arriving late affects you and other patients whose appointments are after yours.  Also, if you miss three or more appointments without notifying the office, you may be dismissed from the clinic at the provider's discretion.    Again, thank you for choosing [facility].  Our hope is that these requests will decrease the amount of time that you wait before being seen by our physicians.   If you have a lab appointment with the Cancer Center - please note that after April 8th, all labs will be drawn in the cancer center.  You do not have to check in or register with the main entrance as you have in the past but will complete your check-in at the cancer center.            _____________________________________________________________  Should you have questions after your visit to [facility], please contact our office at [phone] and follow the prompts.  Our office hours are 8:00 a.m. to 4:30 p.m. Monday - Thursday and 8:00 a.m. to 2:30 p.m. Friday.  Please note that voicemails left after 4:00 p.m. may not be returned until the following business day.  We are closed weekends and all major holidays.  You do have access to a nurse 24-7, just call the main number to the clinic [phone] and do not  press any options, hold on the line and a nurse will answer the phone.    For prescription refill requests, have your pharmacy contact our office and allow 72 hours.    Masks are no longer required in the cancer centers. If you would like for your care team to wear a mask while they are taking care of you, please let them know. You may have one support person who is at least 64 years old accompany you for your appointments.

## 2023-07-27 NOTE — Progress Notes (Signed)
[facility] [address] [place] [address]    Clinic Day:  07/27/23   Referring physician: Delorse Lek, FNP  Patient Care Team: Delorse Lek, FNP as PCP - General (Family Medicine) Doreatha Massed, MD as Medical Oncologist (Medical Oncology) Therese Sarah, RN as Oncology Nurse Navigator (Medical Oncology)   ASSESSMENT & PLAN:   Assessment: 1.  Stage III DLBCL: - CT soft tissue neck (06/06/2022): Left supraclavicular mass 1.9 x 1.6 cm central hypodensity.  Smaller adjacent nodule present.  Oval-shaped mass anterior to the left IJ vein 2.9 x 1.8 cm.  No definite 2 exophytic oropharyngeal or hypopharyngeal mass noted.  Thyroid intact.  Lung bases clear. - Left supraclavicular lymph node biopsy by Dr. Marcha Solders on 06/10/2022 - Pathology: Atypical B-cell lymphoid infiltrate highly concerning for a lymphoproliferative disorder, possibly high-grade in the background of extensive granulomatous type inflammation and necrosis. - 2D echo (06/28/2022): LVEF 60-65%. - PET scan (07/07/2022): Markedly hypermetabolic adenopathy in the neck, chest, pelvis and inguinal regions.  Few subcentimeter pulmonary nodules are too small for PET resolution.  Hepatic steatosis. - Right inguinal lymph node biopsy (07/20/2022): Large B-cell lymphoma with Ki-67 70%.  FISH studies for high risk lymphoma sent. - IPI score-3 (age more than 60, stage III, elevated LDH) - 2D echo (06/28/2022): EF 60 to 65%. - 6 cycles Pola-R-CHP from 08/01/2022 through 11/21/2022.   2.  Social/family history: - She lives at home with her husband.  Works as a Engineer, site for nursing homes.  Non-smoker.  No history of exposure to chemicals or pesticides. - Mother died of pancreatic cancer.  Father had prostate cancer.  Sister had breast cancer at age 39.  Brother had kidney cancer at age 75.   Plan: 1.  Stage III DLBCL: - She denies any B symptoms or infections. - She is seeing Dr. Sharee Pimple for dyspnea on  exertion.  She reports that it improves when she is on prednisone.  She has been off of prednisone for the last 2 days. - Reviewed labs today: LFTs elevated at AST 103 and ALT 78.  Creatinine normal.  CBC grossly normal with mild leukopenia of 3.6. - Reviewed CT CAP from 07/20/2023: No evidence of active lymphoma.  Hepatomegaly with hepatic steatosis is stable. - Recommend follow-up in 6 months with repeat CT CAP and labs.     Orders Placed This Encounter  Procedures   CT CHEST ABDOMEN PELVIS W CONTRAST    Standing Status:   Future    Standing Expiration Date:   07/26/2024    Order Specific Question:   If indicated for the ordered procedure, I authorize the administration of contrast media per Radiology protocol    Answer:   Yes    Order Specific Question:   Does the patient have a contrast media/X-ray dye allergy?    Answer:   No    Order Specific Question:   Preferred imaging location?    Answer:   [facility]    Order Specific Question:   Release to patient    Answer:   Immediate    Order Specific Question:   If indicated for the ordered procedure, I authorize the administration of oral contrast media per Radiology protocol    Answer:   Yes   CBC with Differential/Platelet    Standing Status:   Future    Standing Expiration Date:   07/26/2024    Order Specific Question:   Release to patient  Answer:   Immediate   Comprehensive metabolic panel    Standing Status:   Future    Standing Expiration Date:   07/26/2024    Order Specific Question:   Release to patient    Answer:   Immediate   Lactate dehydrogenase    Standing Status:   Future    Standing Expiration Date:   07/26/2024    Order Specific Question:   Release to patient    Answer:   Immediate       I,Helena R Teague,acting as a scribe for Doreatha Massed, MD.,have documented all relevant documentation on the behalf of Doreatha Massed, MD,as directed by  Doreatha Massed, MD while in the presence  of Doreatha Massed, MD.  I, Doreatha Massed MD, have reviewed the above documentation for accuracy and completeness, and I agree with the above.     Doreatha Massed, MD   10/31/20245:16 PM  CHIEF COMPLAINT:   Diagnosis: stage III large B-cell lymphoma    Cancer Staging  Diffuse large B cell lymphoma (HCC) Staging form: Hodgkin and Non-Hodgkin Lymphoma, AJCC 8th Edition - Clinical stage from 07/27/2022: Stage III (Diffuse large B-cell lymphoma) - Signed by Doreatha Massed, MD on 07/27/2022    Prior Therapy: Pola-R-CHP   Current Therapy: Observation   HISTORY OF PRESENT ILLNESS:   Oncology History  Diffuse large B cell lymphoma (HCC)  07/27/2022 Initial Diagnosis   Diffuse large B cell lymphoma (HCC)   07/27/2022 Cancer Staging   Staging form: Hodgkin and Non-Hodgkin Lymphoma, AJCC 8th Edition - Clinical stage from 07/27/2022: Stage III (Diffuse large B-cell lymphoma) - Signed by Doreatha Massed, MD on 07/27/2022 Histopathologic type: Malignant lymphoma, large B-cell, diffuse, NOS Stage prefix: Initial diagnosis   08/01/2022 -  Chemotherapy   Patient is on Treatment Plan : NON-HODGKINS LYMPHOMA Pola-R-CHP (Polatuzumab + Rituximab -CHP) q21d        INTERVAL HISTORY:   Debbie Hall is a 62 y.o. female presenting to clinic today for follow up of stage III large B-cell lymphoma. She was last seen by me on 04/26/23.  Since her last visit, she underwent CT C/A/P on 07/20/23 that found: no evidence of active lymphoma within the chest, abdomen, or pelvis; hepatic steatosis; and hepatomegaly.  Today, she states that she is doing well overall. Her appetite level is at 30%. Her energy level is at 25%. She went to a pulmonologist, Dr. Sherene Sires, on 06/26/23 and was diagnosed with DOE, being treated with prednisone. She notes when she is not on prednisone breathing issues worsen and her breathing rate increases with her oxygen saturation remaining stable. She has a follow-up with  pulmonology in a few weeks.   PAST MEDICAL HISTORY:   Past Medical History: Past Medical History:  Diagnosis Date   Anemia    Anxiety    Diabetes mellitus (HCC)    Fatty liver    GERD (gastroesophageal reflux disease)    Gout    Hyperlipidemia    Hypertension    Lymphoma (HCC)    Migraines    Pneumonia    as a child   Port-A-Cath in place 07/28/2022    Surgical History: Past Surgical History:  Procedure Laterality Date   ABDOMINAL HYSTERECTOMY     partial   CESAREAN SECTION     x 2   TONSILLECTOMY      Social History: Social History   Socioeconomic History   Marital status: Married    Spouse name: Not on file   Number of children: 4  Years of education: Not on file   Highest education level: Not on file  Occupational History   Occupation: Interior and spatial designer of Nursing    Comment: [employer]  Tobacco Use   Smoking status: Never   Smokeless tobacco: Never  Vaping Use   Vaping status: Never Used  Substance and Sexual Activity   Alcohol use: Yes    Comment: occasional   Drug use: Never   Sexual activity: Not on file  Other Topics Concern   Not on file  Social History Narrative   Not on file   Social Determinants of Health   Financial Resource Strain: Not on file  Food Insecurity: No Food Insecurity (09/21/2022)   Hunger Vital Sign    Worried About Running Out of Food in the Last Year: Never true    Ran Out of Food in the Last Year: Never true  Transportation Needs: No Transportation Needs (09/21/2022)   PRAPARE - Administrator, Civil Service (Medical): No    Lack of Transportation (Non-Medical): No  Physical Activity: Not on file  Stress: Not on file  Social Connections: Not on file  Intimate Partner Violence: Not At Risk (09/21/2022)   Humiliation, Afraid, Rape, and Kick questionnaire    Fear of Current or Ex-Partner: No    Emotionally Abused: No    Physically Abused: No    Sexually Abused: No    Family History: Family  History  Problem Relation Age of Onset   Hypertension Mother    Heart disease Mother    Heart attack Mother    Pancreatic cancer Mother    Prostate cancer Father    Colon cancer Neg Hx    Esophageal cancer Neg Hx    Rectal cancer Neg Hx     Current Medications:  Current Outpatient Medications:    ALPRAZolam (XANAX) 1 MG tablet, Take 1 mg by mouth 3 (three) times daily as needed for anxiety or sleep., Disp: , Rfl:    escitalopram (LEXAPRO) 5 MG tablet, Take 1 tablet by mouth daily., Disp: , Rfl:    famotidine (PEPCID) 20 MG tablet, One after supper, Disp: 30 tablet, Rfl: 11   ibuprofen (ADVIL) 200 MG tablet, Take 600 mg by mouth every 6 (six) hours as needed for moderate pain., Disp: , Rfl:    metoprolol tartrate (LOPRESSOR) 25 MG tablet, Take 25 mg by mouth 2 (two) times daily., Disp: , Rfl:    pantoprazole (PROTONIX) 40 MG tablet, Take 1 tablet (40 mg total) by mouth daily. Take 30-60 min before first meal of the day, Disp: 30 tablet, Rfl: 2   predniSONE (DELTASONE) 10 MG tablet, 2 each am with breakfast until better then 1 daily x one 1 week then half daily until seen then  one half daily, Disp: 100 tablet, Rfl: 0   Allergies: Allergies  Allergen Reactions   Avocado Itching   Banana Itching   Latex Itching    Eyes watering, itching.   Lisinopril     angioedema   Penicillins     Facial swelling   Polivy [Polatuzumab Vedotin] Other (See Comments)    Dry throat and trouble catching her breath.   Prednisone     does not take d/t diabetes   Rituxan [Rituximab]     Itching in her throat    Vancomycin Itching    REVIEW OF SYSTEMS:   Review of Systems  Constitutional:  Negative for chills, fatigue and fever.  HENT:   Negative for lump/mass,  mouth sores, nosebleeds, sore throat and trouble swallowing.   Eyes:  Negative for eye problems.  Respiratory:  Positive for cough and shortness of breath.   Cardiovascular:  Positive for chest pain and palpitations. Negative for leg  swelling.  Gastrointestinal:  Negative for abdominal pain, constipation, diarrhea, nausea and vomiting.  Genitourinary:  Negative for bladder incontinence, difficulty urinating, dysuria, frequency, hematuria and nocturia.   Musculoskeletal:  Negative for arthralgias, back pain, flank pain, myalgias and neck pain.  Skin:  Negative for itching and rash.  Neurological:  Negative for dizziness, headaches and numbness.  Hematological:  Does not bruise/bleed easily.  Psychiatric/Behavioral:  Negative for depression, sleep disturbance and suicidal ideas. The patient is not nervous/anxious.   All other systems reviewed and are negative.    VITALS:   Blood pressure (!) 144/85, pulse 98, temperature 97.6 F (36.4 C), temperature source Oral, resp. rate 20, weight 185 lb 3.2 oz (84 kg), SpO2 99%.  Wt Readings from Last 3 Encounters:  07/27/23 185 lb 3.2 oz (84 kg)  06/26/23 177 lb 6.4 oz (80.5 kg)  04/26/23 175 lb 12.8 oz (79.7 kg)    Body mass index is 30.82 kg/m.  Performance status (ECOG): 0 - Asymptomatic  PHYSICAL EXAM:   Physical Exam Vitals and nursing note reviewed. Exam conducted with a chaperone present.  Constitutional:      Appearance: Normal appearance.  Cardiovascular:     Rate and Rhythm: Normal rate and regular rhythm.     Pulses: Normal pulses.     Heart sounds: Normal heart sounds.  Pulmonary:     Effort: Pulmonary effort is normal.     Breath sounds: Normal breath sounds.  Abdominal:     Palpations: Abdomen is soft. There is no hepatomegaly, splenomegaly or mass.     Tenderness: There is no abdominal tenderness.  Musculoskeletal:     Right lower leg: No edema.     Left lower leg: No edema.  Lymphadenopathy:     Cervical: No cervical adenopathy.     Right cervical: No superficial, deep or posterior cervical adenopathy.    Left cervical: No superficial, deep or posterior cervical adenopathy.     Upper Body:     Right upper body: No supraclavicular or axillary  adenopathy.     Left upper body: No supraclavicular or axillary adenopathy.  Neurological:     General: No focal deficit present.     Mental Status: She is alert and oriented to person, place, and time.  Psychiatric:        Mood and Affect: Mood normal.        Behavior: Behavior normal.     LABS:      Latest Ref Rng & Units 07/20/2023    3:30 PM 06/26/2023    4:23 PM 04/26/2023    1:24 PM  CBC  WBC 4.0 - 10.5 K/uL 3.6  5.4  5.4   Hemoglobin 12.0 - 15.0 g/dL 16.1  09.6  04.5   Hematocrit 36.0 - 46.0 % 39.0  43.1  41.2   Platelets 150 - 400 K/uL 169  233.0  168       Latest Ref Rng & Units 07/20/2023    3:30 PM 06/26/2023    4:23 PM 04/26/2023    1:24 PM  CMP  Glucose 70 - 99 mg/dL 409  811  94   BUN 8 - 23 mg/dL 9  15  18    Creatinine 0.44 - 1.00 mg/dL 9.14  7.82  9.56  Sodium 135 - 145 mmol/L 140  140  137   Potassium 3.5 - 5.1 mmol/L 4.1  3.9  4.4   Chloride 98 - 111 mmol/L 105  106  106   CO2 22 - 32 mmol/L 22  21  23    Calcium 8.9 - 10.3 mg/dL 9.0  9.5  8.8   Total Protein 6.5 - 8.1 g/dL 6.6   6.4   Total Bilirubin 0.3 - 1.2 mg/dL 0.9   0.5   Alkaline Phos 38 - 126 U/L 78   98   AST 15 - 41 U/L 103   98   ALT 0 - 44 U/L 78   104      No results found for: "CEA1", "CEA" / No results found for: "CEA1", "CEA" No results found for: "PSA1" No results found for: "WNU272" No results found for: "CAN125"  No results found for: "TOTALPROTELP", "ALBUMINELP", "A1GS", "A2GS", "BETS", "BETA2SER", "GAMS", "MSPIKE", "SPEI" Lab Results  Component Value Date   TIBC 379 09/07/2020   FERRITIN 252.9 09/07/2020   IRONPCTSAT 25.6 09/07/2020   IRONPCTSAT 25 09/07/2020   Lab Results  Component Value Date   LDH 228 (H) 07/20/2023   LDH 184 04/26/2023   LDH 184 01/05/2023     STUDIES:   CT CHEST ABDOMEN PELVIS W CONTRAST  Result Date: 07/21/2023 CLINICAL DATA:  Diffuse large B-cell lymphoma. Monitor. * Tracking Code: BO * EXAM: CT CHEST, ABDOMEN, AND PELVIS WITH CONTRAST  TECHNIQUE: Multidetector CT imaging of the chest, abdomen and pelvis was performed following the standard protocol during bolus administration of intravenous contrast. RADIATION DOSE REDUCTION: This exam was performed according to the departmental dose-optimization program which includes automated exposure control, adjustment of the mA and/or kV according to patient size and/or use of iterative reconstruction technique. CONTRAST:  OMNIPAQUE IOHEXOL 300 MG/ML  SOLN COMPARISON:  12/29/2022 PET.  No prior CTs. FINDINGS: CT CHEST FINDINGS Cardiovascular: Right Port-A-Cath tip mid right atrium. Bovine arch. Aortic atherosclerosis. Borderline cardiomegaly. No central pulmonary embolism, on this non-dedicated study. Mediastinum/Nodes: No supraclavicular adenopathy. No axillary adenopathy. No mediastinal or hilar adenopathy. Lungs/Pleura: No pleural fluid. Mild nodularity along the right minor fissure is similar and likely due to subpleural lymph nodes including on 72/3. A subpleural right middle lobe 6 mm nodule including on 79/3 is felt to be present and likely similar on prior PET. More inferior right middle lobe scarring. Musculoskeletal: No acute osseous abnormality. CT ABDOMEN PELVIS FINDINGS Hepatobiliary: Mild hepatic steatosis. Moderate hepatomegaly, 22.5 cm. Normal gallbladder, without biliary ductal dilatation. Pancreas: Normal, without mass or ductal dilatation. Spleen: Normal in size, without focal abnormality. Adrenals/Urinary Tract: Normal adrenal glands. Normal kidneys, without hydronephrosis. Normal urinary bladder. Stomach/Bowel: Normal stomach, without wall thickening. Normal colon and terminal ileum. Normal small bowel. Vascular/Lymphatic: Aortic atherosclerosis. No abdominopelvic adenopathy. Right inguinal surgical changes. Reproductive: Normal uterus and adnexa. Other: No significant free fluid.  Mild pelvic floor laxity. Musculoskeletal: Degenerative disc disease at the lumbosacral junction.  IMPRESSION: 1. No evidence of active lymphoma within the chest, abdomen, or pelvis. 2. Hepatic steatosis and hepatomegaly 3.  Aortic Atherosclerosis (ICD10-I70.0). Electronically Signed   By: Jeronimo Greaves M.D.   On: 07/21/2023 14:58

## 2023-07-28 ENCOUNTER — Other Ambulatory Visit: Payer: Self-pay

## 2023-08-27 NOTE — Progress Notes (Unsigned)
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  Stage III DLBCL: - CT soft tissue neck (06/06/2022): Left supraclavicular mass 1.9 x 1.6 cm central hypodensity.  Smaller adjacent nodule present.  Oval-shaped mass anterior to the left IJ vein 2.9 x 1.8 cm.  No definite 2 exophytic oropharyngeal or hypopharyngeal mass noted.  Thyroid intact.  Lung bases clear. - Left supraclavicular lymph node biopsy by Dr. Marcha Solders on 06/10/2022 - Pathology: Atypical B-cell lymphoid infiltrate highly concerning for a lymphoproliferative disorder, possibly high-grade in the background of extensive granulomatous type inflammation and necrosis. - 2D echo (06/28/2022): LVEF 60-65%. - PET scan (07/07/2022): Markedly hypermetabolic adenopathy in the neck, chest, pelvis and inguinal regions.  Few subcentimeter pulmonary nodules are too small for PET resolution.  Hepatic steatosis. - Right inguinal lymph node biopsy (07/20/2022): Large B-cell lymphoma with Ki-67 70%.  FISH studies for high risk lymphoma sent. - IPI score-3 (age more than 60, stage III, elevated LDH) - 2D echo (06/28/2022): EF 60 to 65%. - 6 cycles Pola-R-CHP from 08/01/2022 through 11/21/2022.      History of Present Illness  06/26/2023  Pulmonary/ 1st office eval/Rodolfo Notaro maint on symb 80 2 pffs in am shortly p rising  Chief Complaint  Patient presents with   Consult    Sob ( 10 months ) pt is using symbicort but feels its not working for her. Has never been diagnosed with asthma. Pt is done with chemo started while on chemo   Baseline prior to dx of lymphoma was walking 2 miles [place]mill [place] - last did this one year prior to OV   July 11th 2024 sats lowest = 80s with w/u  CT [facility] =  CT c/w  GG changes p last chemo feb 26th 2024  Dyspnea: 3 aisles at [employer] slow pace  is her limit, not checking sats at peak ex  Cough: congested sounding cough > not productive- no better on inhalers but technique is very poor  Sleep: flat bed/ 3 pillows and half way thru night wakes up nightly  SABA use: twice daily, not really helping  Prednisone works great while on 3 sep courses =  12 day 5 days 5 days then sporadic a Rec  GERD diet reviewed, bed blocks rec  Pantoprazole (protonix) 40 mg   Take  30-60 min before first meal of the day and Pepcid (famotidine)  20 mg after supper until return to office  Prednisone 10 mg  2 each am with breakfast until better then 1 daily x one 1 week then half daily until seen then  Please schedule a follow up office visit in 4 weeks, sooner if needed  with all medications /inhalers/ solutions in hand      - 06/26/23 labs  Eos 0.5/ EST 28  off prednisone   - CT chest with contrast 07/20/23 no AS dz   08/28/2023  f/u ov/[place] office/Scotti Motter re: doe with GG changes ? BOOP/ ? Drug reaction maint on ***  No chief complaint on file.   Dyspnea:  *** Cough: *** Sleeping: ***   resp cc  SABA use: *** 02: ***  Lung cancer screening: ***   No obvious day to day or daytime variability or assoc excess/ purulent sputum or mucus  plugs or hemoptysis or cp or chest tightness, subjective wheeze or overt sinus or hb symptoms.    Also denies any obvious fluctuation of symptoms with weather or environmental changes or other aggravating or alleviating factors except as outlined above   No unusual exposure hx or h/o childhood pna/ asthma or knowledge of premature birth.  Current Allergies, Complete Past Medical History, Past Surgical History, Family History, and Social History were reviewed in [employer] record.  ROS  The following are not active complaints unless bolded Hoarseness, sore throat, dysphagia, dental problems, itching, sneezing,  nasal congestion or discharge of excess mucus or purulent secretions, ear ache,    fever, chills, sweats, unintended wt loss or wt gain, classically pleuritic or exertional cp,  orthopnea pnd or arm/hand swelling  or leg swelling, presyncope, palpitations, abdominal pain, anorexia, nausea, vomiting, diarrhea  or change in bowel habits or change in bladder habits, change in stools or change in urine, dysuria, hematuria,  rash, arthralgias, visual complaints, headache, numbness, weakness or ataxia or problems with walking or coordination,  change in mood or  memory.        No outpatient medications have been marked as taking for the 08/28/23 encounter (Appointment) with Nyoka Cowden, MD.           Past Medical History:  Diagnosis Date   Anemia    Anxiety    Diabetes mellitus (HCC)    Fatty liver    GERD (gastroesophageal reflux disease)    Gout    Hyperlipidemia    Hypertension    Lymphoma (HCC)    Migraines    Pneumonia    as a child   Port-A-Cath in place 07/28/2022      Objective:    Wt Readings from Last 3 Encounters:  07/27/23 185 lb 3.2 oz (84 kg)  06/26/23 177 lb 6.4 oz (80.5 kg)  04/26/23 175 lb 12.8 oz (79.7 kg)      Vital signs reviewed  08/28/2023  - Note at rest 02 sats  ***% on ***   General appearance:    ***        Assessment

## 2023-08-28 ENCOUNTER — Ambulatory Visit: Payer: Managed Care, Other (non HMO) | Admitting: Internal Medicine

## 2023-09-25 NOTE — Progress Notes (Signed)
 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  Stage III DLBCL: - CT soft tissue neck (06/06/2022): Left supraclavicular mass 1.9 x 1.6 cm central hypodensity.  Smaller adjacent nodule present.  Oval-shaped mass anterior to the left IJ vein 2.9 x 1.8 cm.  No definite 2 exophytic oropharyngeal or hypopharyngeal mass noted.  Thyroid  intact.  Lung bases clear. - Left supraclavicular lymph node biopsy by Dr. Maranda on 06/10/2022 - Pathology: Atypical B-cell lymphoid infiltrate highly concerning for a lymphoproliferative disorder, possibly high-grade in the background of extensive granulomatous type inflammation and necrosis. - 2D echo (06/28/2022): LVEF 60-65%. - PET scan (07/07/2022): Markedly hypermetabolic adenopathy in the neck, chest, pelvis and inguinal regions.  Few subcentimeter pulmonary nodules are too small for PET resolution.  Hepatic steatosis. - Right inguinal lymph node biopsy (07/20/2022): Large B-cell lymphoma with Ki-67 70%.  FISH studies for high risk lymphoma sent. - IPI score-3 (age more than 60, stage III, elevated LDH) - 2D echo (06/28/2022): EF 60 to 65%. - 6 cycles Pola-R-CHP from 08/01/2022 through 11/21/2022.      History of Present Illness  06/26/2023  Pulmonary/ 1st Hall eval/Debbie Hall maint on symb 80 2 pffs in am shortly p rising  Chief Complaint  Patient presents with   Consult    Sob ( 10 months ) pt is using symbicort  but feels its not working for her. Has never been diagnosed with asthma. Pt is done with chemo started while on chemo   Baseline prior to dx of lymphoma was walking 2 miles [place]mill [place] - last did this one year prior to OV   July 11th 2024 sats lowest = 80s with w/u  CT [facility] =  CT c/w  GG changes p last chemo feb 26th 2024  Dyspnea: 3 aisles at [employer] slow pace  is her limit, not checking sats at peak ex  Cough: congested sounding cough > not productive- no better on inhalers but technique is very poor  Sleep: flat bed/ 3 pillows and half way thru night wakes up nightly  SABA use: twice daily, not really helping  Prednisone  works great while on 3 sep courses =  12 day 5 days 5 days then sporadic a Rec  GERD diet reviewed, bed blocks rec  Pantoprazole  (protonix ) 40 mg   Take  30-60 min before first meal of the day and Pepcid  (famotidine )  20 mg after supper until return to Hall  Prednisone  10 mg  2 each am with breakfast until better then 1 daily x one 1 week then half daily until seen then  Please schedule a follow up [facility] visit in 4 weeks, sooner if needed  with all medications /inhalers/ solutions in hand      - 06/26/23 labs  Eos 0.5/ ESR 28  off prednisone    - CT chest with contrast 07/20/23 no AS dz   09/26/2023  f/u ov/Debbie Hall/Debbie Hall re: doe with GG changes ? BOOP/ ? Drug reaction maint on no rx since last prednisone  was 3 weeks prior to OV    Chief Complaint  Patient presents with   Follow-up  Dyspnea:  no regular ex yet/ lots walking flat surface in NH's good pace  Cough: grinding cough x mas of 2023 > non productive  >  rx mucinex   rx albuterol / symbicort  did not work  but prednisone  does  Sleeping: bed blocks and 3 pillows with 2-3 noct/ week    resp cc  SABA use: none  02: none     No obvious day to day or daytime variability or assoc excess/ purulent sputum or mucus plugs or hemoptysis or cp or chest tightness, subjective wheeze or overt sinus or hb symptoms.    Also denies any obvious fluctuation of symptoms with weather or environmental changes or other aggravating or alleviating factors except as outlined above   No unusual exposure hx or h/o childhood pna/ asthma or knowledge of premature birth.  Current Allergies, Complete Past Medical History, Past Surgical History, Family History, and Social History were reviewed  in [employer] record.  ROS  The following are not active complaints unless bolded Hoarseness, sore throat, dysphagia, dental problems, itching, sneezing,  nasal congestion or discharge of excess mucus or purulent secretions, ear ache,   fever, chills, sweats, unintended wt loss or wt gain, classically pleuritic or exertional cp,  orthopnea pnd or arm/hand swelling  or leg swelling, presyncope, palpitations, abdominal pain, anorexia, nausea, vomiting, diarrhea  or change in bowel habits or change in bladder habits, change in stools or change in urine, dysuria, hematuria,  rash, arthralgias, visual complaints, headache, numbness, weakness or ataxia or problems with walking or coordination,  change in mood or  memory.        Current Meds  Medication Sig   ALPRAZolam  (XANAX ) 1 MG tablet Take 1 mg by mouth 3 (three) times daily as needed for anxiety or sleep.   escitalopram (LEXAPRO) 5 MG tablet Take 1 tablet by mouth daily.   famotidine  (PEPCID ) 20 MG tablet One after supper   ibuprofen (ADVIL) 200 MG tablet Take 600 mg by mouth every 6 (six) hours as needed for moderate pain.   metoprolol tartrate (LOPRESSOR) 25 MG tablet Take 25 mg by mouth 2 (two) times daily.   pantoprazole  (PROTONIX ) 40 MG tablet Take 1 tablet (40 mg total) by mouth daily. Take 30-60 min before first meal of the day   SEMGLEE , YFGN, 100 UNIT/ML Pen            Past Medical History:  Diagnosis Date   Anemia    Anxiety    Diabetes mellitus (HCC)    Fatty liver    GERD (gastroesophageal reflux disease)    Gout    Hyperlipidemia    Hypertension    Lymphoma (HCC)    Migraines    Pneumonia    as a child   Port-A-Cath in place 07/28/2022      Objective:    Wt Readings from Last 3 Encounters:  09/26/23 186 lb 3.2 oz (84.5 kg)  07/27/23 185 lb 3.2 oz (84 kg)  06/26/23 177 lb 6.4 oz (80.5 kg)      Vital signs reviewed  09/26/2023  - Note at rest 02 sats  94% on RA   General appearance:     somber wf/ rattling cough worse at exp      HEENT : Oropharynx  clear      Nasal turbinates nl    NECK :  without  apparent JVD/ palpable Nodes/TM    LUNGS: no acc muscle use,  Nl contour chest with minimal rhonchi on FVC maneuver which bring on cough even with PLB at end exp     CV:  RRR  no s3 or murmur or increase in  P2, and no edema   ABD:  soft and nontender   MS:  Gait nl   ext warm without deformities Or obvious joint restrictions  calf tenderness, cyanosis or clubbing    SKIN: warm and dry without lesions    NEURO:  alert, approp, nl sensorium with  no motor or cerebellar deficits apparent.         Assessment

## 2023-09-26 ENCOUNTER — Encounter: Payer: Self-pay | Admitting: Internal Medicine

## 2023-09-26 ENCOUNTER — Ambulatory Visit: Payer: Managed Care, Other (non HMO) | Admitting: Internal Medicine

## 2023-09-26 ENCOUNTER — Encounter: Payer: Self-pay | Admitting: Hematology

## 2023-09-26 ENCOUNTER — Other Ambulatory Visit (HOSPITAL_COMMUNITY): Payer: Self-pay

## 2023-09-26 ENCOUNTER — Telehealth: Payer: Self-pay

## 2023-09-26 VITALS — BP 134/71 | HR 80 | Ht 65.0 in | Wt 186.2 lb

## 2023-09-26 DIAGNOSIS — R0609 Other forms of dyspnea: Secondary | ICD-10-CM | POA: Diagnosis not present

## 2023-09-26 DIAGNOSIS — J4489 Other specified chronic obstructive pulmonary disease: Secondary | ICD-10-CM | POA: Diagnosis not present

## 2023-09-26 MED ORDER — BUDESONIDE-FORMOTEROL FUMARATE 80-4.5 MCG/ACT IN AERO: INHALATION_SPRAY | RESPIRATORY_TRACT | 12 refills | Status: DC

## 2023-09-26 NOTE — Telephone Encounter (Signed)
*  Pulm  Pharmacy Patient Advocate Encounter   Received notification from CoverMyMeds that prior authorization for Breyna  80-4.5MCG/ACT aerosol  is required/requested.   Insurance verification completed.   The patient is insured through [employer] .   Per test claim: The current 30 day co-pay is, $0.00.  No PA needed at this time. This test claim was processed through [facility]- copay amounts may vary at other pharmacies due to pharmacy/plan contracts, or as the patient moves through the different stages of their insurance plan.

## 2023-09-26 NOTE — Assessment & Plan Note (Signed)
 09/26/2023 cough on exp improved on prednisone  > trial of symbicort  80   - The proper method of use, as well as anticipated side effects, of a metered-dose inhaler were discussed and demonstrated to the patient using teach back method   F/u in 6 weeks with prn prednisone  in meantime for flare see avs for instructions unique to this ov

## 2023-09-26 NOTE — Patient Instructions (Addendum)
 No change on prednisone  dosing =  20 mg until better (ceiling) and taper to zero as your floor   Symbicort  80 Take 2 puffs first thing in am and then another 2 puffs about 12 hours later    Work on inhaler technique:  relax and gently blow all the way out then take a nice smooth full deep breath back in, triggering the inhaler at same time you start breathing in.  Hold breath in for at least  5 seconds if you can. Blow out symbicort  80  thru nose. Rinse and gargle with water when done.  If mouth or throat bother you at all,  try brushing teeth/gums/tongue with arm and hammer toothpaste/ make a slurry and gargle and spit out.    Please schedule a follow up office visit in 6 weeks, call sooner if needed - bring inhalers

## 2023-09-26 NOTE — Assessment & Plan Note (Signed)
 6 cycles Pola-R-CHP from 08/01/2022 through 11/21/2022 ? Onset of doe as gave up treadmill walking at dx  - echo 05/12/23  Mild MR and Grade 1 diastolic dysfunction  - peak DOE April 06 2023 with desats resting > [place] ct c/w GG > rx short term prednisone  only  - 06/26/2023   Walked on   x  3   lap(s) =  approx 750  ft  @ mod pace, stopped due to sob with lowest 02 sats 92%   - 06/26/2023  After extensive coaching inhaler device,  effectiveness =    50% with  baseline= 25 % so continue symbicort  80 - 06/26/2023 rx Prednisone  10 mg  2 each am with breakfast until better then 1 daily x one 1 week then half daily until seen then  one half daily  - 06/26/23 labs  Eos 0.5/ EST 28  prior to prednisone   - CT chest with contrast 07/20/23 no AS dz  - 09/26/2023   Walked on    x  3   lap(s) =  approx 450  ft  @ nl pace, stopped due to end of study  with lowest 02 sats 95% / coughed towards end of study and kept coughing at rest for a few minutes but resolved at rest   She still has airway issues that may required prednisone  but hopefully symbicort  80 2bid will suffice and if now:  The goal with a chronic steroid dependent illness is always arriving at the lowest effective dose that controls the disease/symptoms and not accepting a set formula which is based on statistics or guidelines that don't always take into account patient  variability or the natural hx of the dz in every individual patient, which may well vary over time.  For now therefore I recommend the patient maintain  ceiling of 20 mg and floor or zero  Each maintenance medication was reviewed in detail including emphasizing most importantly the difference between maintenance and prns and under what circumstances the prns are to be triggered using an action plan format where appropriate.  Total time for H and P, chart review, counseling, reviewing hfa device(s) , directly observing portions of ambulatory 02 saturation study/ and generating customized AVS  unique to this office visit / same day charting = 35 min

## 2023-10-11 ENCOUNTER — Telehealth: Payer: Self-pay | Admitting: Internal Medicine

## 2023-10-11 NOTE — Telephone Encounter (Signed)
 Spoke with Debbie Hall regarding new appointment date and time---Dr. Waymond Hailey not in office on 11/07/23---Debbie Hall rescheduled Wednesday 11/08/23 at 8:45 am.  Will mail information to Debbie Hall and she voiced her understanding

## 2023-11-01 ENCOUNTER — Encounter: Payer: Self-pay | Admitting: Hematology

## 2023-11-06 NOTE — Progress Notes (Signed)
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  Stage III DLBCL: - CT soft tissue neck (06/06/2022): Left supraclavicular mass 1.9 x 1.6 cm central hypodensity.  Smaller adjacent nodule present.  Oval-shaped mass anterior to the left IJ vein 2.9 x 1.8 cm.  No definite 2 exophytic oropharyngeal or hypopharyngeal mass noted.  Thyroid intact.  Lung bases clear. - Left supraclavicular lymph node biopsy by Dr. Marcha Solders on 06/10/2022 - Pathology: Atypical B-cell lymphoid infiltrate highly concerning for a lymphoproliferative disorder, possibly high-grade in the background of extensive granulomatous type inflammation and necrosis. - 2D echo (06/28/2022): LVEF 60-65%. - PET scan (07/07/2022): Markedly hypermetabolic adenopathy in the neck, chest, pelvis and inguinal regions.  Few subcentimeter pulmonary nodules are too small for PET resolution.  Hepatic steatosis. - Right inguinal lymph node biopsy (07/20/2022): Large B-cell lymphoma with Ki-67 70%.  FISH studies for high risk lymphoma sent. - IPI score-3 (age more than 60, stage III, elevated LDH) - 2D echo (06/28/2022): EF 60 to 65%. - 6 cycles Pola-R-CHP from 08/01/2022 through 11/21/2022.      History of Present Illness  06/26/2023  Pulmonary/ 1st office eval/Jhonnie Aliano maint on symb 80 2 pffs in am shortly p rising  Chief Complaint  Patient presents with   Consult    Sob ( 10 months ) pt is using symbicort but feels its not working for her. Has never been diagnosed with asthma. Pt is done with chemo started while on chemo   Baseline prior to dx of lymphoma was walking 2 miles [place] - last did this one year prior to OV   July 11th 2024 sats lowest = 80s with w/u  CT [facility] =  CT c/w  GG changes p last chemo feb 26th 2024  Dyspnea: 3 aisles at [employer] slow pace  is her limit, not checking sats at peak ex  Cough: congested sounding cough > not productive- no better on inhalers but technique is very poor  Sleep: flat bed/ 3 pillows and half way thru night wakes up nightly  SABA use: twice daily, not really helping  Prednisone works great while on 3 sep courses =  12 day 5 days 5 days then sporadic a Rec  GERD diet reviewed, bed blocks rec  Pantoprazole (protonix) 40 mg   Take  30-60 min before first meal of the day and Pepcid (famotidine)  20 mg after supper until return to office  Prednisone 10 mg  2 each am with breakfast until better then 1 daily x one 1 week then half daily until seen then  Please schedule a follow up office visit in 4 weeks, sooner if needed  with all medications /inhalers/ solutions in hand      - 06/26/23 labs  Eos 0.5/ ESR 28  off prednisone   - CT chest with contrast 07/20/23 no AS dz   09/26/2023  f/u ov/[place] office/Berneta Sconyers re: doe with GG changes ? BOOP/ ? Drug reaction maint on no rx since last prednisone was 3 weeks prior to OV    Chief Complaint  Patient presents with   Follow-up  Dyspnea:  no regular ex yet/ lots walking flat surface in NH's good pace  Cough: grinding cough x mas of 2023 > non productive  >  rx mucinex  rx albuterol / symbicort did not work  but prednisone does  Sleeping: bed blocks and 3 pillows with 2-3 noct/ week    resp cc  SABA use: none  02: none  Rec No change on prednisone dosing =  20 mg until better (ceiling) and taper to zero as your floor  Symbicort 80 Take 2 puffs first thing in am and then another 2 puffs about 12 hours later Work on inhaler technique:   11/08/2023  f/u ov/[place] office/Gesselle Fitzsimons re: cough since covid   maint on gerd rx and last pred was 1st week in Jan but never completely resolved then worsened first week in feb   Chief Complaint  Patient presents with   Asthma   Dyspnea:  improves with prednisone  Cough: 1st thing in am not productive  Sleeping: bed blocks    resp cc cough at 2 - 3 AM even when on prednisone  SABA use: has duoneb not using  02: none Symbicort makes her gag      No obvious day to day or daytime variability or assoc excess/ purulent sputum or mucus plugs or hemoptysis or cp or chest tightness, subjective wheeze or overt sinus or hb symptoms.    Also denies any obvious fluctuation of symptoms with weather or environmental changes or other aggravating or alleviating factors except as outlined above   No unusual exposure hx or h/o childhood pna/ asthma or knowledge of premature birth.  Current Allergies, Complete Past Medical History, Past Surgical History, Family History, and Social History were reviewed in [employer] record.  ROS  The following are not active complaints unless bolded Hoarseness, sore throat, dysphagia, dental problems, itching, sneezing,  nasal congestion or discharge of excess mucus or purulent secretions, ear ache,   fever, chills, sweats, unintended wt loss or wt gain, classically pleuritic or exertional cp,  orthopnea pnd or arm/hand swelling  or leg swelling, presyncope, palpitations, abdominal pain, anorexia, nausea, vomiting, diarrhea  or change in bowel habits or change in bladder habits, change in stools or change in urine, dysuria, hematuria,  rash, arthralgias, visual complaints, headache, numbness, weakness or ataxia or problems with walking or coordination,  change in mood or  memory.        Current Meds  Medication Sig   ALPRAZolam (XANAX) 1 MG tablet Take 1 mg by mouth 3 (three) times daily as needed for anxiety or sleep.   escitalopram (LEXAPRO) 5 MG tablet Take 1 tablet by mouth daily.   famotidine (PEPCID) 20 MG tablet One after supper   ibuprofen (ADVIL) 200 MG tablet Take 600 mg by mouth every 6 (six) hours as needed for moderate pain.   metoprolol tartrate (LOPRESSOR) 25 MG tablet Take 25 mg by mouth 2 (two) times daily.   pantoprazole (PROTONIX) 40 MG tablet Take 1  tablet (40 mg total) by mouth daily. Take 30-60 min before first meal of the day   predniSONE (DELTASONE) 10 MG tablet 2 each am with breakfast until better then 1 daily x one 1 week then half daily until seen then  one half daily   SEMGLEE, YFGN, 100 UNIT/ML Pen    [DISCONTINUED] budesonide-formoterol (SYMBICORT) 80-4.5 MCG/ACT inhaler Take 2 puffs first thing in am and then another 2 puffs about 12 hours later.               Past Medical History:  Diagnosis Date   Anemia    Anxiety    Diabetes mellitus (  HCC)    Fatty liver    GERD (gastroesophageal reflux disease)    Gout    Hyperlipidemia    Hypertension    Lymphoma (HCC)    Migraines    Pneumonia    as a child   Port-A-Cath in place 07/28/2022      Objective:    Wts  11/08/2023       187   09/26/23 186 lb 3.2 oz (84.5 kg)  07/27/23 185 lb 3.2 oz (84 kg)  06/26/23 177 lb 6.4 oz (80.5 kg)    Vital signs reviewed  11/08/2023  - Note at rest 02 sats  98% on RA   General appearance:    amb wf nad    HEENT : Oropharynx  clear     Nasal turbinates nl    NECK :  without  apparent JVD/ palpable Nodes/TM    LUNGS: no acc muscle use,  Nl contour chest which is clear x at end exp with bilateral rhonchi and coughing fits    CV:  RRR  no s3 or murmur or increase in P2, and no edema   ABD:  soft and nontender   MS:  Gait nl   ext warm without deformities Or obvious joint restrictions  calf tenderness, cyanosis or clubbing    SKIN: warm and dry without lesions    NEURO:  alert, approp, nl sensorium with  no motor or cerebellar deficits apparent.            Assessment

## 2023-11-07 ENCOUNTER — Ambulatory Visit: Payer: Managed Care, Other (non HMO) | Admitting: Internal Medicine

## 2023-11-08 ENCOUNTER — Ambulatory Visit: Payer: Managed Care, Other (non HMO) | Admitting: Internal Medicine

## 2023-11-08 ENCOUNTER — Encounter: Payer: Self-pay | Admitting: Internal Medicine

## 2023-11-08 VITALS — BP 140/100 | HR 87 | Ht 65.0 in | Wt 187.0 lb

## 2023-11-08 DIAGNOSIS — I10 Essential (primary) hypertension: Secondary | ICD-10-CM

## 2023-11-08 DIAGNOSIS — J4489 Other specified chronic obstructive pulmonary disease: Secondary | ICD-10-CM

## 2023-11-08 MED ORDER — BISOPROLOL FUMARATE 5 MG PO TABS: 5.0000 mg | ORAL_TABLET | Freq: Every day | ORAL | 11 refills | Status: DC

## 2023-11-08 MED ORDER — BUDESONIDE 0.5 MG/2ML IN SUSP: RESPIRATORY_TRACT | 12 refills | Status: DC

## 2023-11-08 NOTE — Assessment & Plan Note (Signed)
In the setting of respiratory symptoms of unknown etiology,  It would be preferable to use bystolic, the most beta -1  selective Beta blocker available in sample form, with bisoprolol the most selective generic choice  on the market, at least on a trial basis, to make sure the spillover Beta 2 effects of the less specific Beta blockers are not contributing to this patient's symptoms.   >>> change lopressor to bisoprolol 5 mg q d          Each maintenance medication was reviewed in detail including emphasizing most importantly the difference between maintenance and prns and under what circumstances the prns are to be triggered using an action plan format where appropriate.  Total time for H and P, chart review, counseling, reviewing neb  device(s) and generating customized AVS unique to this office visit / same day charting = 

## 2023-11-08 NOTE — Patient Instructions (Addendum)
Stop lopressor and start bisoprolol 5 mg daily   No more symbicort   Duoneb twice daily as a maintenance and if doesn't make you cough add budesonide 0.5 mg twice daily   If needed can add extra duoneb but not budesonide   No change on prednisone dosing =  20 mg until better (ceiling) and taper to zero as your floor    Please schedule a follow up visit in 3 months but call sooner if needed

## 2023-11-08 NOTE — Assessment & Plan Note (Addendum)
09/26/2023 cough on exp improved on prednisone > trial of symbicort  80   - 11/08/2023 could not tol symbicort 80 s gagging  - 11/08/2023 start duoneb bid with bud 0.5 mg bid and continue pred 20mg  until better then taper to zero  The goal with a chronic steroid dependent illness is always arriving at the lowest effective dose that controls the disease/symptoms and not accepting a set "formula" which is based on statistics or guidelines that don't always take into account patient  variability or the natural hx of the dz in every individual patient, which may well vary over time.  For now therefore I recommend the patient maintain  off prednisone if can control the main symptom (cough ) with topical rx with high dose budesonide as tol   I had an extended discussion with the patient reviewing all relevant studies completed to date and  lasting 15 to 20 minutes of a 25 minute visit re:  Discussed in detail all the  indications, usual  risks and alternatives  relative to the benefits with patient who agrees to proceed with w/u as outlined.      F/u in 3 m, sooner if needed

## 2023-11-22 ENCOUNTER — Other Ambulatory Visit: Payer: Self-pay | Admitting: Internal Medicine

## 2024-01-10 ENCOUNTER — Encounter: Payer: Self-pay | Admitting: Hematology

## 2024-01-12 ENCOUNTER — Encounter: Payer: Self-pay | Admitting: Hematology

## 2024-01-24 ENCOUNTER — Ambulatory Visit (HOSPITAL_COMMUNITY)
Admission: RE | Admit: 2024-01-24 | Discharge: 2024-01-24 | Disposition: A | Discharge status: Home / Self Care | Payer: Managed Care, Other (non HMO) | Source: Ambulatory Visit | Attending: Hematology | Admitting: Hematology

## 2024-01-24 ENCOUNTER — Inpatient Hospital Stay: Payer: Managed Care, Other (non HMO) | Attending: Hematology

## 2024-01-24 DIAGNOSIS — C8338 Diffuse large B-cell lymphoma, lymph nodes of multiple sites: Secondary | ICD-10-CM | POA: Insufficient documentation

## 2024-01-24 DIAGNOSIS — C8330 Diffuse large B-cell lymphoma, unspecified site: Secondary | ICD-10-CM | POA: Insufficient documentation

## 2024-01-24 DIAGNOSIS — C8590 Non-Hodgkin lymphoma, unspecified, unspecified site: Secondary | ICD-10-CM

## 2024-01-24 LAB — CBC WITH DIFFERENTIAL/PLATELET
Abs Immature Granulocytes: 0.04 10*3/uL (ref 0.00–0.07)
Basophils Absolute: 0 10*3/uL (ref 0.0–0.1)
Basophils Relative: 0 %
Eosinophils Absolute: 0 10*3/uL (ref 0.0–0.5)
Eosinophils Relative: 0 %
HCT: 36 % (ref 36.0–46.0)
Hemoglobin: 11.5 g/dL — ABNORMAL LOW (ref 12.0–15.0)
Immature Granulocytes: 1 %
Lymphocytes Relative: 8 %
Lymphs Abs: 0.6 10*3/uL — ABNORMAL LOW (ref 0.7–4.0)
MCH: 30.6 pg (ref 26.0–34.0)
MCHC: 31.9 g/dL (ref 30.0–36.0)
MCV: 95.7 fL (ref 80.0–100.0)
Monocytes Absolute: 0.3 10*3/uL (ref 0.1–1.0)
Monocytes Relative: 5 %
Neutro Abs: 5.8 10*3/uL (ref 1.7–7.7)
Neutrophils Relative %: 86 %
Platelets: 191 10*3/uL (ref 150–400)
RBC: 3.76 MIL/uL — ABNORMAL LOW (ref 3.87–5.11)
RDW: 13.2 % (ref 11.5–15.5)
WBC: 6.7 10*3/uL (ref 4.0–10.5)
nRBC: 0 % (ref 0.0–0.2)

## 2024-01-24 LAB — COMPREHENSIVE METABOLIC PANEL WITH GFR
ALT: 47 U/L — ABNORMAL HIGH (ref 0–44)
AST: 84 U/L — ABNORMAL HIGH (ref 15–41)
Albumin: 3.8 g/dL (ref 3.5–5.0)
Alkaline Phosphatase: 124 U/L (ref 38–126)
Anion gap: 11 (ref 5–15)
BUN: 15 mg/dL (ref 8–23)
CO2: 21 mmol/L — ABNORMAL LOW (ref 22–32)
Calcium: 8.9 mg/dL (ref 8.9–10.3)
Chloride: 104 mmol/L (ref 98–111)
Creatinine, Ser: 0.6 mg/dL (ref 0.44–1.00)
GFR, Estimated: >60 mL/min (ref >60)
Glucose, Bld: 158 mg/dL — ABNORMAL HIGH (ref 70–99)
Potassium: 3.9 mmol/L (ref 3.5–5.1)
Sodium: 136 mmol/L (ref 135–145)
Total Bilirubin: 0.8 mg/dL (ref 0.0–1.2)
Total Protein: 6.4 g/dL — ABNORMAL LOW (ref 6.5–8.1)

## 2024-01-24 LAB — LACTATE DEHYDROGENASE: LDH: 167 U/L (ref 98–192)

## 2024-01-24 MED ORDER — IOHEXOL 300 MG/ML  SOLN
100.0000 mL | Freq: Once | INTRAMUSCULAR | Status: AC | PRN
  Administered 2024-01-24: 100 mL via INTRAVENOUS

## 2024-01-24 MED ORDER — SODIUM CHLORIDE 0.9% FLUSH
10.0000 mL | Freq: Once | INTRAVENOUS | Status: AC
  Administered 2024-01-24: 10 mL via INTRAVENOUS

## 2024-01-24 NOTE — Progress Notes (Signed)
 Patients port flushed without difficulty.  Good blood return noted with no bruising or swelling noted at site.  Band aid applied.  VSS with discharge and left in satisfactory condition with no s/s of distress noted.

## 2024-01-24 NOTE — Patient Instructions (Signed)

## 2024-01-29 NOTE — Progress Notes (Deleted)
 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  Stage III DLBCL: - CT soft tissue neck (06/06/2022): Left supraclavicular mass 1.9 x 1.6 cm central hypodensity.  Smaller adjacent nodule present.  Oval-shaped mass anterior to the left IJ vein 2.9 x 1.8 cm.  No definite 2 exophytic oropharyngeal or hypopharyngeal mass noted.  Thyroid  intact.  Lung bases clear. - Left supraclavicular lymph node biopsy by Dr. Louanne Hall on 06/10/2022 - Pathology: Atypical B-cell lymphoid infiltrate highly concerning for a lymphoproliferative disorder, possibly high-grade in the background of extensive granulomatous type inflammation and necrosis. - 2D echo (06/28/2022): LVEF 60-65%. - PET scan (07/07/2022): Markedly hypermetabolic adenopathy in the neck, chest, pelvis and inguinal regions.  Few subcentimeter pulmonary nodules are too small for PET resolution.  Hepatic steatosis. - Right inguinal lymph node biopsy (07/20/2022): Large B-cell lymphoma with Ki-67 70%.  FISH studies for high risk lymphoma sent. - IPI score-3 (age more than 60, stage III, elevated LDH) - 2D echo (06/28/2022): EF 60 to 65%. - 6 cycles Pola-R-CHP from 08/01/2022 through 11/21/2022.      History of Present Illness  06/26/2023  Pulmonary/ 1st office eval/Debbie Hall maint on symb 80 2 pffs in am shortly p rising  Chief Complaint  Patient presents with   Consult    Sob ( 10 months ) pt is using symbicort  but feels its not working for her. Has never been diagnosed with asthma. Pt is done with chemo started while on chemo   Baseline prior to dx of lymphoma was walking 2 miles on a treadmill in [place] - last did this one year prior to OV   July 11th 2024 sats lowest = 80s with w/u  CT [facility] =  CT c/w  GG changes p last chemo feb 26th 2024  Dyspnea: 3 aisles at [employer] slow pace  is her limit, not checking sats at peak ex  Cough: congested sounding cough > not productive- no better on inhalers but technique is very poor  Sleep: flat bed/ 3 pillows and half way thru night wakes up nightly  SABA use: twice daily, not really helping  Prednisone  works great while on 3 sep courses =  12 day 5 days 5 days then sporadic a Rec  GERD diet reviewed, bed blocks rec  Pantoprazole  (protonix ) 40 mg   Take  30-60 min before first meal of the day and Pepcid  (famotidine )  20 mg after supper until return to office  Prednisone  10 mg  2 each am with breakfast until better then 1 daily x one 1 week then half daily until seen then  Please schedule a follow up office visit in 4 weeks, sooner if needed  with all medications /inhalers/ solutions in hand      - 06/26/23 labs  Eos 0.5/ ESR 28  off prednisone    - CT chest with contrast 07/20/23 no AS dz   09/26/2023  f/u ov/[place] office/Debbie Hall re: doe with GG changes ? BOOP/ ? Drug reaction maint on no rx since last prednisone  was 3 weeks prior to OV    Chief Complaint  Patient presents with   Follow-up  Dyspnea:  no regular ex yet/ lots walking flat surface in NH's good pace  Cough: grinding cough x mas of 2023 > non productive  >  rx mucinex   rx albuterol / symbicort  did not work  but prednisone  does  Sleeping: bed blocks and 3 pillows with 2-3 noct/ week    resp cc  SABA use: none  02: none  Rec No change on prednisone  dosing =  20 mg until better (ceiling) and taper to zero as your floor  Symbicort  80 Take 2 puffs first thing in am and then another 2 puffs about 12 hours later Work on inhaler technique:   11/08/2023  f/u ov/[place] office/Debbie Hall re: cough since covid   maint on gerd rx and last pred was 1st week in Jan but never completely resolved then worsened first week in feb   Chief Complaint  Patient presents with   Asthma   Dyspnea:  improves with prednisone   Cough: 1st thing in am not productive  Sleeping: bed blocks    resp cc cough at 2 - 3 AM even when on prednisone   SABA use: has duoneb not using  02: none Symbicort  makes her gag Rec     01/30/2024  f/u ov/[place] office/Debbie Hall re: *** maint on ***  No chief complaint on file.   Dyspnea:  *** Cough: *** Sleeping: ***   resp cc  SABA use: *** 02: ***  Lung cancer screening: ***   No obvious day to day or daytime variability or assoc excess/ purulent sputum or mucus plugs or hemoptysis or cp or chest tightness, subjective wheeze or overt sinus or hb symptoms.    Also denies any obvious fluctuation of symptoms with weather or environmental changes or other aggravating or alleviating factors except as outlined above   No unusual exposure hx or h/o childhood pna/ asthma or knowledge of premature birth.  Current Allergies, Complete Past Medical History, Past Surgical History, Family History, and Social History were reviewed in [employer] record.  ROS  The following are not active complaints unless bolded Hoarseness, sore throat, dysphagia, dental problems, itching, sneezing,  nasal congestion or discharge of excess mucus or purulent secretions, ear ache,   fever, chills, sweats, unintended wt loss or wt gain, classically pleuritic or exertional cp,  orthopnea pnd or arm/hand swelling  or leg swelling, presyncope, palpitations, abdominal pain, anorexia, nausea, vomiting, diarrhea  or change in bowel habits or change in bladder habits, change in stools or change in urine, dysuria, hematuria,  rash, arthralgias, visual complaints, headache, numbness, weakness or ataxia or problems with walking or coordination,  change in mood or  memory.        No outpatient medications have been marked as taking for the 01/30/24 encounter (Appointment) with Debbie Formica, MD.                Past Medical History:  Diagnosis Date   Anemia    Anxiety    Diabetes mellitus (HCC)    Fatty liver    GERD (gastroesophageal reflux disease)     Gout    Hyperlipidemia    Hypertension    Lymphoma (HCC)    Migraines    Pneumonia    as a child   Port-A-Cath in place 07/28/2022      Objective:    Wts  01/30/2024          ***  11/08/2023       187   09/26/23 186 lb 3.2 oz (84.5 kg)  07/27/23 185 lb 3.2 oz (84 kg)  06/26/23 177 lb 6.4 oz (80.5 kg)    Vital signs reviewed  01/30/2024  -  Note at rest 02 sats  ***% on ***   General appearance:    ***                Assessment

## 2024-01-30 ENCOUNTER — Ambulatory Visit: Payer: Managed Care, Other (non HMO) | Admitting: Internal Medicine

## 2024-01-30 NOTE — Progress Notes (Signed)
 [facility] [address], [place] [address]    Clinic Day:  01/30/24   Referring physician: Daphney Eans, FNP  Patient Care Team: Daphney Eans, FNP as PCP - General (Family Medicine) Paulett Boros, MD as Medical Oncologist (Medical Oncology) Gerhard Knuckles, RN as Oncology Nurse Navigator (Medical Oncology) [name], MD as Consulting Physician (Pulmonary Disease)  I connected with Debbie Hall on 01/31/24 at  3:15 PM EDT by telephone visit and verified that I am speaking with the correct person using two identifiers.   I discussed the limitations, risks, security and privacy concerns of performing an evaluation and management service by telemedicine and the availability of in-person appointments. I also discussed with the patient that there may be a patient responsible charge related to this service. The patient expressed understanding and agreed to proceed.   Patient location: Home Physician location: Office  ASSESSMENT & PLAN:   Assessment: 1.  Stage III DLBCL: - CT soft tissue neck (06/06/2022): Left supraclavicular mass 1.9 x 1.6 cm central hypodensity.  Smaller adjacent nodule present.  Oval-shaped mass anterior to the left IJ vein 2.9 x 1.8 cm.  No definite 2 exophytic oropharyngeal or hypopharyngeal mass noted.  Thyroid  intact.  Lung bases clear. - Left supraclavicular lymph node biopsy by Dr. Louanne Roussel on 06/10/2022 - Pathology: Atypical B-cell lymphoid infiltrate highly concerning for a lymphoproliferative disorder, possibly high-grade in the background of extensive granulomatous type inflammation and necrosis. - 2D echo (06/28/2022): LVEF 60-65%. - PET scan (07/07/2022): Markedly hypermetabolic adenopathy in the neck, chest, pelvis and inguinal regions.  Few subcentimeter pulmonary nodules are too small for PET resolution.  Hepatic steatosis. - Right inguinal lymph node biopsy (07/20/2022): Large B-cell lymphoma with Ki-67 70%.  FISH  studies for high risk lymphoma sent. - IPI score-3 (age more than 60, stage III, elevated LDH) - 2D echo (06/28/2022): EF 60 to 65%. - 6 cycles Pola-R-CHP from 08/01/2022 through 11/21/2022.   2.  Social/family history: - She lives at home with her husband.  Works as a Engineer, site for nursing homes.  Non-smoker.  No history of exposure to chemicals or pesticides. - Mother died of pancreatic cancer.  Father had prostate cancer.  Sister had breast cancer at age 14.  Brother had kidney cancer at age 78.   Plan: 1.  Stage III DLBCL: - She denies any fevers, night sweats or weight loss in the last 6 months.  Denies any infections in the last 6 months. - Reviewed labs from 01/24/2024: AST and ALT are better at 84 and 47 compared to previous values.  LDH is normal.  CBC grossly normal with mild anemia with hemoglobin 11.5. - CT CAP on 01/24/2024: No evidence of recurrence of lymphoma.  Hepatomegaly with diffuse hepatic steatosis. - As her hemoglobin dropped slightly, I have recommended that she have it checked in 3 months at her PMDs office. - She will come back in person in 6 months with repeat labs on same day.  Will do imaging if clinical condition dictates.   Total time spent is 20 minutes.  No orders of the defined types were placed in this encounter.      Nadeen Augusta Teague,acting as a Neurosurgeon for Paulett Boros, MD.,have documented all relevant documentation on the behalf of Paulett Boros, MD,as directed by  Paulett Boros, MD while in the presence of Paulett Boros, MD.  I, Paulett Boros MD, have reviewed the above documentation for accuracy and completeness, and  I agree with the above.      [address] [place]   5/6/202510:53 AM  CHIEF COMPLAINT:   Diagnosis: stage III large B-cell lymphoma    Cancer Staging  Diffuse large B cell lymphoma (HCC) Staging form: Hodgkin and Non-Hodgkin Lymphoma, AJCC 8th Edition - Clinical stage from 07/27/2022: Stage III  (Diffuse large B-cell lymphoma) - Signed by Paulett Boros, MD on 07/27/2022    Prior Therapy: Pola-R-CHP   Current Therapy: Observation   HISTORY OF PRESENT ILLNESS:   Oncology History  Diffuse large B cell lymphoma (HCC)  07/27/2022 Initial Diagnosis   Diffuse large B cell lymphoma (HCC)   07/27/2022 Cancer Staging   Staging form: Hodgkin and Non-Hodgkin Lymphoma, AJCC 8th Edition - Clinical stage from 07/27/2022: Stage III (Diffuse large B-cell lymphoma) - Signed by Paulett Boros, MD on 07/27/2022 Histopathologic type: Malignant lymphoma, large B-cell, diffuse, NOS Stage prefix: Initial diagnosis   08/01/2022 -  Chemotherapy   Patient is on Treatment Plan : NON-HODGKINS LYMPHOMA Pola-R-CHP (Polatuzumab + Rituximab  -CHP) q21d        INTERVAL HISTORY:   Debbie Hall is a 63 y.o. female presenting to clinic today for follow up of stage III large B-cell lymphoma. She was last seen by me on 07/27/23.  Since her last visit, she underwent CT CAP on 01/24/24 that found: No evidence of recurrent lymphoma within the chest, abdomen or pelvis. Hepatomegaly with diffuse hepatic steatosis. Colonic diverticulosis.  Today, she states that she is doing well overall. Her appetite level is at 80%. Her energy level is at 70%.   PAST MEDICAL HISTORY:   Past Medical History: Past Medical History:  Diagnosis Date   Anemia    Anxiety    Diabetes mellitus (HCC)    Fatty liver    GERD (gastroesophageal reflux disease)    Gout    Hyperlipidemia    Hypertension    Lymphoma (HCC)    Migraines    Pneumonia    as a child   Port-A-Cath in place 07/28/2022    Surgical History: Past Surgical History:  Procedure Laterality Date   ABDOMINAL HYSTERECTOMY     partial   CESAREAN SECTION     x 2   TONSILLECTOMY      Social History: Social History   Socioeconomic History   Marital status: Married    Spouse name: Not on file   Number of children: 4   Years of education: Not on file    Highest education level: Not on file  Occupational History   Occupation: Interior and spatial designer of Nursing    Comment: [employer]  Tobacco Use   Smoking status: Never   Smokeless tobacco: Never  Vaping Use   Vaping status: Never Used  Substance and Sexual Activity   Alcohol use: Yes    Comment: occasional   Drug use: Never   Sexual activity: Not on file  Other Topics Concern   Not on file  Social History Narrative   Not on file   Social Drivers of Health   Financial Resource Strain: Not on file  Food Insecurity: No Food Insecurity (09/21/2022)   Hunger Vital Sign    Worried About Running Out of Food in the Last Year: Never true    Ran Out of Food in the Last Year: Never true  Transportation Needs: No Transportation Needs (09/21/2022)   PRAPARE - Administrator, Civil Service (Medical): No    Lack of Transportation (Non-Medical): No  Physical Activity: Not  on file  Stress: Not on file  Social Connections: Not on file  Intimate Partner Violence: Not At Risk (09/21/2022)   Humiliation, Afraid, Rape, and Kick questionnaire    Fear of Current or Ex-Partner: No    Emotionally Abused: No    Physically Abused: No    Sexually Abused: No    Family History: Family History  Problem Relation Age of Onset   Hypertension Mother    Heart disease Mother    Heart attack Mother    Pancreatic cancer Mother    Prostate cancer Father    Colon cancer Neg Hx    Esophageal cancer Neg Hx    Rectal cancer Neg Hx     Current Medications:  Current Outpatient Medications:    ALPRAZolam  (XANAX ) 1 MG tablet, Take 1 mg by mouth 3 (three) times daily as needed for anxiety or sleep., Disp: , Rfl:    bisoprolol  (ZEBETA ) 5 MG tablet, Take 1 tablet (5 mg total) by mouth daily., Disp: 30 tablet, Rfl: 11   budesonide  (PULMICORT ) 0.5 MG/2ML nebulizer solution, Use 0.5mg  twice daily in nebulizer, Disp: 60 mL, Rfl: 12   escitalopram (LEXAPRO) 5 MG tablet, Take 1 tablet by mouth daily.,  Disp: , Rfl:    famotidine  (PEPCID ) 20 MG tablet, One after supper, Disp: 30 tablet, Rfl: 11   ibuprofen (ADVIL) 200 MG tablet, Take 600 mg by mouth every 6 (six) hours as needed for moderate pain., Disp: , Rfl:    pantoprazole  (PROTONIX ) 40 MG tablet, TAKE 1 TABLET BY MOUTH  DAILY 30-60 MINUTES BEFORE FIRST MEAL OF THE DAY., Disp: 30 tablet, Rfl: 0   predniSONE  (DELTASONE ) 10 MG tablet, 2 each am with breakfast until better then 1 daily x one 1 week then half daily until seen then  one half daily, Disp: 100 tablet, Rfl: 0   SEMGLEE , YFGN, 100 UNIT/ML Pen, , Disp: , Rfl:    Allergies: Allergies  Allergen Reactions   Avocado Itching   Banana Itching   Latex Itching    Eyes watering, itching.   Lisinopril     angioedema   Penicillins     Facial swelling   Polivy  [Polatuzumab Vedotin ] Other (See Comments)    Dry throat and trouble catching her breath.   Prednisone      does not take d/t diabetes   Rituxan  [Rituximab ]     Itching in her throat    Vancomycin  Itching    REVIEW OF SYSTEMS:   Review of Systems  Constitutional:  Negative for chills, fatigue and fever.  HENT:   Negative for lump/mass, mouth sores, nosebleeds, sore throat and trouble swallowing.   Eyes:  Negative for eye problems.  Respiratory:  Positive for shortness of breath. Negative for cough.   Cardiovascular:  Negative for chest pain, leg swelling and palpitations.  Gastrointestinal:  Positive for nausea. Negative for abdominal pain, constipation, diarrhea and vomiting.  Genitourinary:  Negative for bladder incontinence, difficulty urinating, dysuria, frequency, hematuria and nocturia.   Musculoskeletal:  Negative for arthralgias, back pain, flank pain, myalgias and neck pain.  Skin:  Negative for itching and rash.  Neurological:  Positive for headaches. Negative for dizziness and numbness.  Hematological:  Does not bruise/bleed easily.  Psychiatric/Behavioral:  Negative for depression, sleep disturbance and  suicidal ideas. The patient is not nervous/anxious.   All other systems reviewed and are negative.    VITALS:   There were no vitals taken for this visit.  Wt Readings from Last 3 Encounters:  11/08/23 187 lb (84.8 kg)  09/26/23 186 lb 3.2 oz (84.5 kg)  07/27/23 185 lb 3.2 oz (84 kg)    There is no height or weight on file to calculate BMI.  Performance status (ECOG): 0 - Asymptomatic  PHYSICAL EXAM:   Cannot be done due to virtual visit.  LABS:      Latest Ref Rng & Units 01/24/2024    3:09 PM 07/20/2023    3:30 PM 06/26/2023    4:23 PM  CBC  WBC 4.0 - 10.5 K/uL 6.7  3.6  5.4   Hemoglobin 12.0 - 15.0 g/dL 60.4  54.0  98.1   Hematocrit 36.0 - 46.0 % 36.0  39.0  43.1   Platelets 150 - 400 K/uL 191  169  233.0       Latest Ref Rng & Units 01/24/2024    3:09 PM 07/20/2023    3:30 PM 06/26/2023    4:23 PM  CMP  Glucose 70 - 99 mg/dL 191  478  295   BUN 8 - 23 mg/dL 15  9  15    Creatinine 0.44 - 1.00 mg/dL 6.21  3.08  6.57   Sodium 135 - 145 mmol/L 136  140  140   Potassium 3.5 - 5.1 mmol/L 3.9  4.1  3.9   Chloride 98 - 111 mmol/L 104  105  106   CO2 22 - 32 mmol/L 21  22  21    Calcium 8.9 - 10.3 mg/dL 8.9  9.0  9.5   Total Protein 6.5 - 8.1 g/dL 6.4  6.6    Total Bilirubin 0.0 - 1.2 mg/dL 0.8  0.9    Alkaline Phos 38 - 126 U/L 124  78    AST 15 - 41 U/L 84  103    ALT 0 - 44 U/L 47  78       No results found for: "CEA1", "CEA" / No results found for: "CEA1", "CEA" No results found for: "PSA1" No results found for: "QIO962" No results found for: "CAN125"  No results found for: "TOTALPROTELP", "ALBUMINELP", "A1GS", "A2GS", "BETS", "BETA2SER", "GAMS", "MSPIKE", "SPEI" Lab Results  Component Value Date   TIBC 379 09/07/2020   FERRITIN 252.9 09/07/2020   IRONPCTSAT 25.6 09/07/2020   IRONPCTSAT 25 09/07/2020   Lab Results  Component Value Date   LDH 167 01/24/2024   LDH 228 (H) 07/20/2023   LDH 184 04/26/2023     STUDIES:   CT CHEST ABDOMEN PELVIS W  CONTRAST Result Date: 01/24/2024 CLINICAL DATA:  History of diffuse large B-cell lymphoma, monitor. * Tracking Code: BO * EXAM: CT CHEST, ABDOMEN, AND PELVIS WITH CONTRAST TECHNIQUE: Multidetector CT imaging of the chest, abdomen and pelvis was performed following the standard protocol during bolus administration of intravenous contrast. RADIATION DOSE REDUCTION: This exam was performed according to the departmental dose-optimization program which includes automated exposure control, adjustment of the mA and/or kV according to patient size and/or use of iterative reconstruction technique. CONTRAST:  OMNIPAQUE  IOHEXOL  300 MG/ML  SOLN COMPARISON:  Multiple priors including CT July 20, 2023 FINDINGS: CT CHEST FINDINGS Cardiovascular: Accessed right chest Port-A-Cath with tip near the superior cavoatrial junction. Normal caliber thoracic aorta. Normal size heart. No significant pericardial effusion/thickening. Mediastinum/Nodes: No suspicious thyroid  nodule. No pathologically enlarged mediastinal, hilar or axillary lymph nodes. Esophagus is grossly unremarkable. Lungs/Pleura: No suspicious pulmonary nodules or masses. Scattered atelectasis/scarring. No pleural effusion. No pneumothorax. Musculoskeletal: No aggressive lytic or blastic lesion of bone. CT ABDOMEN PELVIS FINDINGS  Hepatobiliary: No suspicious hepatic lesion. Hepatomegaly. Diffuse hepatic steatosis. Along the gallbladder fossa there is a geographic area of decreased density even less than that of background liver without masslike features and preserved internal vascularity/hepatic architecture compatible with focal fatty infiltration. Gallbladder is unremarkable. No biliary ductal dilation. Pancreas: No pancreatic ductal dilation or evidence of acute inflammation. Spleen: No splenomegaly. Adrenals/Urinary Tract: Bilateral adrenal glands appear normal. No hydronephrosis. Kidneys demonstrate symmetric enhancement. Urinary bladder is unremarkable for  degree of distension. Stomach/Bowel: Radiopaque enteric contrast material traverses distal loops of small bowel. Stomach is unremarkable for degree of distension. No pathologic dilation of small or large bowel. Colonic diverticulosis. No evidence of acute bowel inflammation. Noninflamed appendix. Vascular/Lymphatic: Normal caliber abdominal aorta. Aortic atherosclerosis. Smooth IVC contours. The portal, splenic and superior mesenteric veins are patent. No pathologically enlarged abdominal or pelvic lymph nodes. Reproductive: No suspicious mass or acute finding. Other: Right inguinal surgical clips.  Mild pelvic floor laxity. Musculoskeletal: No aggressive lytic or blastic lesion of bone. Multilevel degenerative change of the spine. IMPRESSION: 1. No evidence of recurrent lymphoma within the chest, abdomen or pelvis. 2. Hepatomegaly with diffuse hepatic steatosis. 3. Colonic diverticulosis. Electronically Signed   By: Tama Fails M.D.   On: 01/24/2024 16:22

## 2024-01-31 ENCOUNTER — Inpatient Hospital Stay: Payer: Managed Care, Other (non HMO) | Attending: Hematology | Admitting: Hematology

## 2024-01-31 ENCOUNTER — Other Ambulatory Visit: Payer: Self-pay | Admitting: *Deleted

## 2024-01-31 DIAGNOSIS — C8338 Diffuse large B-cell lymphoma, lymph nodes of multiple sites: Secondary | ICD-10-CM

## 2024-02-01 ENCOUNTER — Other Ambulatory Visit: Payer: Self-pay

## 2024-02-07 ENCOUNTER — Other Ambulatory Visit: Payer: Self-pay

## 2024-04-22 NOTE — Progress Notes (Signed)
 Erroneous Encounter.-Error

## 2024-07-25 ENCOUNTER — Ambulatory Visit: Payer: Self-pay | Admitting: Internal Medicine

## 2024-07-25 NOTE — Telephone Encounter (Signed)
 FYI Only or Action Required?: Action required by provider: medication refill request.  Patient is followed in Pulmonology for asthma, last seen on 11/08/2023 by Debbie Ozell NOVAK, MD.  Called Nurse Triage reporting Shortness of Breath.  Symptoms began today.  Interventions attempted: Nothing.  Symptoms are: unchanged.  Triage Disposition: See HCP Within 4 Hours (Or PCP Triage)  Patient/caregiver understands and will follow disposition?: No, wishes to speak with PCP     Copied from CRM #[id number]. 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   Pt of Debbie Hall Reason for Disposition  [1] MILD difficulty breathing (e.g., minimal/no SOB at rest, SOB with walking, pulse < 100) AND [2] NEW-onset or WORSE than normal  Answer Assessment - Initial Assessment Questions Pt states she has covid scar tissue on one of her lungs. She saw Dr. [name] who told gave her a script for prednisone  based on symptoms. She states she hasn't had any issues so she hasn't seen him in a while. She states this morning about 4am she woke up and felt like she couldn't catch her breath. Her HR was high, o2 was 84%. Her PCP is out of the office and the doctor covering will not send her anything and advised her to go to the ER. She states because she isn't in distress, they won't take her seriously. She'd like for Dr. Darlean to send in a script for prednisone  like he did before and to schedule a follow up appt. Next available is 11/19. RN advised will send a message to team to see if meds can be sent in and when to schedule the follow up. Rn did give instructions on when to go to the ER, even though she's worried they won't take her seriously.      1.  RESPIRATORY STATUS: Describe your breathing? (e.g., wheezing, shortness of breath, unable to speak, severe coughing)      Pt states it feels like she can't catch her breath. Can't take a deep breath 2. ONSET: When did this breathing problem begin?      This am 3. PATTERN Does the difficult breathing come and go, or has it been constant since it started?      Constant today 4. SEVERITY: How bad is your breathing? (e.g., mild, moderate, severe)      moderate 5. RECURRENT SYMPTOM: Have you had difficulty breathing before? If Yes, ask: When was the last time? and What happened that time?      Yes- prednisone  helps  7. LUNG HISTORY: Do you have any history of lung disease?  (e.g., pulmonary embolus, asthma, emphysema)     Scarring on lungs from covid 8. CAUSE: What do you think is causing the breathing problem?      Thinks the weather my have impacted it a little bit 9. OTHER SYMPTOMS: Do you have any other symptoms? (e.g., chest pain, cough, dizziness, fever, runny nose)     no 10. O2 SATURATION MONITOR:  Do you use an oxygen saturation monitor (pulse oximeter) at home? If Yes, ask: What is your reading (oxygen level) today? What is your usual oxygen saturation reading? (e.g., 95%)       89  Protocols used: Breathing Difficulty-A-AH

## 2024-07-26 ENCOUNTER — Ambulatory Visit: Payer: Self-pay | Admitting: Internal Medicine

## 2024-07-26 NOTE — Telephone Encounter (Signed)
 Called and left detailed msg on vm regarding dr werts rec.

## 2024-07-26 NOTE — Telephone Encounter (Signed)
 FYI Only or Action Required?: Action required by provider: request for appointment.  Patient is followed in Pulmonology for asthma, last seen on 11/08/2023 by [name], MD.  Called Nurse Triage reporting Advice Only.  Triage Disposition: See PCP Within 2 Weeks  Patient/caregiver understands and will follow disposition?: No, wishes to speak with PCP  Copied from CRM #[id number]. Topic: Clinical - Medical Advice >> Jul 26, 2024  3:56 PM Nathanel DEL wrote: Reason for CRM: pt called yesterday, and call returned today w/ Dr Darlean recommendations.   There is a phone note w/ c/b today Reason for Disposition  Requesting regular office appointment  Answer Assessment - Initial Assessment Questions 1. REASON FOR CALL: What is the main reason for your call? or How can I best help you?     Patient called to find out the message that was left for her by the office. Dr.Wert's recommendation was given to patient. Patient states she went to Urgent Care yesterday and was given 5 days of prednisone . Patient is asking if she can get an appointment with Dr. Darlean. Requesting a call back.  Protocols used: Information Only Call - No Triage-A-AH

## 2024-07-26 NOTE — Telephone Encounter (Signed)
 Pt's oxygen was in the 80s this morning, Having a difficult time catching breath. Pt requesting medication and a follow up   Please advise

## 2024-08-02 NOTE — Telephone Encounter (Signed)
 Pt needs appt   Patient states she went to Urgent Care yesterday and was given 5 days of prednisone . Patient is asking if she can get an appointment with Dr. Darlean. Requesting a call back.  Pt's oxygen was in the 80s this morning, Having a difficult time catching breath. Pt requesting medication and a follow up

## 2024-08-02 NOTE — Telephone Encounter (Signed)
 Attempted to call patient x2, no one answered so I left a voicemail to give us  a call back.

## 2024-08-05 NOTE — Progress Notes (Unsigned)
 [name] [address], [place] [address]   CLINIC:  Medical Oncology/Hematology  PCP:  Almeda Loa ORN, FNP 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  PRIOR THERAPY: Pola R-CHP 08/01/2022 through 11/21/2022  CURRENT THERAPY: Surveillance  BRIEF ONCOLOGIC HISTORY:   Oncology History  Diffuse large B cell lymphoma (HCC)  07/27/2022 Initial Diagnosis   Diffuse large B cell lymphoma (HCC)   07/27/2022 Cancer Staging   Staging form: Hodgkin and Non-Hodgkin Lymphoma, AJCC 8th Edition - Clinical stage from 07/27/2022: Stage III (Diffuse large B-cell lymphoma) - Signed by Rogers Hai, MD on 07/27/2022 Histopathologic type: Malignant lymphoma, large B-cell, diffuse, NOS Stage prefix: Initial diagnosis   08/01/2022 - 11/21/2022 Chemotherapy   Patient is on Treatment Plan : NON-HODGKINS LYMPHOMA Pola-R-CHP (Polatuzumab + Rituximab  -CHP) q21d       CANCER STAGING: Cancer Staging  Diffuse large B cell lymphoma (HCC) Staging form: Hodgkin and Non-Hodgkin Lymphoma, AJCC 8th Edition - Clinical stage from 07/27/2022: Stage III (Diffuse large B-cell lymphoma) - Signed by Rogers Hai, MD on 07/27/2022   INTERVAL HISTORY:   Ms. Debbie Hall, a 63 y.o. female, returns for routine follow-up of her stage IIIb diffuse large B-cell lymphoma. Debbie Hall was last seen on 01/31/2024 by Dr. Rogers.   At today's visit, she  reports feeling ***.   ***She denies any recent hospitalizations, surgeries, or changes in her  baseline health status. ***She reports ***% energy and ***% appetite.   ***She is maintaining stable weight at this time.  ***She denies any fevers, night sweats or weight loss in the last 6 months.   ***Denies any infections in the last 6 months. ***She has not noticed any new masses or lymphadenopathy.   ASSESSMENT & PLAN:  1.  Stage III  DLBCL: - CT soft tissue neck (06/06/2022): Left supraclavicular mass 1.9 x 1.6 cm central hypodensity.  Smaller adjacent nodule present.  Oval-shaped mass anterior to the left IJ vein 2.9 x 1.8 cm.  No definite 2 exophytic oropharyngeal or hypopharyngeal mass noted.  Thyroid  intact.  Lung bases clear. - Left supraclavicular lymph node biopsy by Dr. Maranda on 06/10/2022 - Pathology: Atypical B-cell lymphoid infiltrate highly concerning for a lymphoproliferative disorder, possibly high-grade in the background of extensive granulomatous type inflammation and necrosis. - 2D echo (06/28/2022): LVEF 60-65%. - PET scan (07/07/2022): Markedly hypermetabolic adenopathy in the neck, chest, pelvis and inguinal regions.  Few subcentimeter pulmonary nodules are too small for PET resolution.  Hepatic steatosis. - Right inguinal lymph node biopsy (07/20/2022): Large B-cell lymphoma with Ki-67 70%.  FISH studies for high risk lymphoma sent. - IPI score-3 (age more than 60, stage III, elevated LDH) - 2D echo (06/28/2022): EF 60 to 65%. - 6 cycles Pola-R-CHP from 08/01/2022 through 11/21/2022. - Posttreatment PET scan (12/29/2022): No evidence of lymphoma recurrence. - CT CAP on 01/24/2024: No evidence of recurrence of lymphoma.  Hepatomegaly with diffuse hepatic steatosis. - She denies any fevers, night sweats or weight loss in the last 6 months. *** - Denies any infections in the last 6 months.*** - Labs today (***): *** ***Reviewed labs from 01/24/2024: AST and ALT are better at 84 and 47 compared to previous values.  LDH is normal.  CBC grossly normal with mild anemia with hemoglobin 11.5.  As her hemoglobin dropped slightly, I have recommended that she have it checked in 3 months at her  PMDs office.*** - Physical exam today *** NCCN Guidelines for Surveillance & Survivorship of Diffuse Large B-Cell Lymphoma For all stages, if complete response (PET negative) following treatment, surveillance protocol is: H/P and labs  every 3 to 6 months for 5 years, then annually or as clinically indicated CT CAP with contrast no more often than every 6 months for 2 years after completion of treatment, then only as clinically indicated    - PLAN: No clinical evidence of recurrent lymphoma at this time. - Continue with H/P and labs every 6 months until 5 years after completion of treatment (February 2029). - Will check CT CAP with contrast in February 2026, then only as clinically indicated.  2.  Social/family history: - She lives at home with her husband.  Works as a engineer, site for nursing homes.  Non-smoker.  No history of exposure to chemicals or pesticides. - Mother died of pancreatic cancer.  Father had prostate cancer.  Sister had breast cancer at age 29.  Brother had kidney cancer at age 42.  PLAN SUMMARY: >> CT CAP with contrast in February 2026 >> Labs in 6 months = CBC/D, CMP, LDH >> OFFICE visit in 6 months (1 week after labs) ***   REVIEW OF SYSTEMS: ***  Review of Systems - Oncology  PHYSICAL EXAM:   Performance status (ECOG): {CHL ONC ED:8845999799} *** There were no vitals filed for this visit. Wt Readings from Last 3 Encounters:  11/08/23 187 lb (84.8 kg)  09/26/23 186 lb 3.2 oz (84.5 kg)  07/27/23 185 lb 3.2 oz (84 kg)   Physical Exam   PAST MEDICAL/SURGICAL HISTORY:  Past Medical History:  Diagnosis Date   Anemia    Anxiety    Diabetes mellitus (HCC)    Fatty liver    GERD (gastroesophageal reflux disease)    Gout    Hyperlipidemia    Hypertension    Lymphoma (HCC)    Migraines    Pneumonia    as a child   Port-A-Cath in place 07/28/2022   Past Surgical History:  Procedure Laterality Date   ABDOMINAL HYSTERECTOMY     partial   CESAREAN SECTION     x 2   TONSILLECTOMY      SOCIAL HISTORY:  Social History   Socioeconomic History   Marital status: Married    Spouse name: Not on file   Number of children: 4   Years of education: Not on file   Highest education  level: Not on file  Occupational History   Occupation: Interior And Spatial Designer of Nursing    Comment: [employer]  Tobacco Use   Smoking status: Never   Smokeless tobacco: Never  Vaping Use   Vaping status: Never Used  Substance and Sexual Activity   Alcohol use: Yes    Comment: occasional   Drug use: Never   Sexual activity: Not on file  Other Topics Concern   Not on file  Social History Narrative   Not on file   Social Drivers of Health   Financial Resource Strain: Not on file  Food Insecurity: No Food Insecurity (09/21/2022)   Hunger Vital Sign    Worried About Running Out of Food in the Last Year: Never true    Ran Out of Food in the Last Year: Never true  Transportation Needs: No Transportation Needs (09/21/2022)   PRAPARE - Administrator, Civil Service (Medical): No    Lack of Transportation (Non-Medical): No  Physical Activity: Not on  file  Stress: Not on file  Social Connections: Not on file  Intimate Partner Violence: Not At Risk (09/21/2022)   Humiliation, Afraid, Rape, and Kick questionnaire    Fear of Current or Ex-Partner: No    Emotionally Abused: No    Physically Abused: No    Sexually Abused: No    FAMILY HISTORY:  Family History  Problem Relation Age of Onset   Hypertension Mother    Heart disease Mother    Heart attack Mother    Pancreatic cancer Mother    Prostate cancer Father    Colon cancer Neg Hx    Esophageal cancer Neg Hx    Rectal cancer Neg Hx     CURRENT MEDICATIONS:  Current Outpatient Medications  Medication Sig Dispense Refill   ALPRAZolam  (XANAX ) 1 MG tablet Take 1 mg by mouth 3 (three) times daily as needed for anxiety or sleep.     escitalopram (LEXAPRO) 5 MG tablet Take 1 tablet by mouth daily.     famotidine  (PEPCID ) 20 MG tablet One after supper 30 tablet 11   ibuprofen (ADVIL) 200 MG tablet Take 600 mg by mouth every 6 (six) hours as needed for moderate pain.     metoprolol tartrate (LOPRESSOR) 25 MG  tablet Take 25 mg by mouth 2 (two) times daily.     pantoprazole  (PROTONIX ) 40 MG tablet TAKE 1 TABLET BY MOUTH  DAILY 30-60 MINUTES BEFORE FIRST MEAL OF THE DAY. 30 tablet 0   predniSONE  (DELTASONE ) 10 MG tablet 2 each am with breakfast until better then 1 daily x one 1 week then half daily until seen then  one half daily 100 tablet 0   promethazine  (PHENERGAN ) 25 MG tablet Take 25 mg by mouth every 6 (six) hours as needed.     SEMGLEE , YFGN, 100 UNIT/ML Pen      No current facility-administered medications for this visit.    ALLERGIES:  Allergies  Allergen Reactions   Avocado Itching   Banana Itching   Latex Itching    Eyes watering, itching.   Lisinopril     angioedema   Penicillins     Facial swelling   Polivy  [Polatuzumab Vedotin ] Other (See Comments)    Dry throat and trouble catching her breath.   Prednisone      does not take d/t diabetes   Rituxan  [Rituximab ]     Itching in her throat    Vancomycin  Itching    LABORATORY DATA:  I have reviewed the labs as listed.     Latest Ref Rng & Units 01/24/2024    3:09 PM 07/20/2023    3:30 PM 06/26/2023    4:23 PM  CBC  WBC 4.0 - 10.5 K/uL 6.7  3.6  5.4   Hemoglobin 12.0 - 15.0 g/dL 88.4  87.2  85.8   Hematocrit 36.0 - 46.0 % 36.0  39.0  43.1   Platelets 150 - 400 K/uL 191  169  233.0       Latest Ref Rng & Units 01/24/2024    3:09 PM 07/20/2023    3:30 PM 06/26/2023    4:23 PM  CMP  Glucose 70 - 99 mg/dL 841  887  887   BUN 8 - 23 mg/dL 15  9  15    Creatinine 0.44 - 1.00 mg/dL 9.39  9.34  9.23   Sodium 135 - 145 mmol/L 136  140  140   Potassium 3.5 - 5.1 mmol/L 3.9  4.1  3.9   Chloride 98 -  111 mmol/L 104  105  106   CO2 22 - 32 mmol/L 21  22  21    Calcium 8.9 - 10.3 mg/dL 8.9  9.0  9.5   Total Protein 6.5 - 8.1 g/dL 6.4  6.6    Total Bilirubin 0.0 - 1.2 mg/dL 0.8  0.9    Alkaline Phos 38 - 126 U/L 124  78    AST 15 - 41 U/L 84  103    ALT 0 - 44 U/L 47  78      DIAGNOSTIC IMAGING:  I have independently  reviewed the scans and discussed with the patient. No results found.   WRAP UP:  All questions were answered. The patient knows to call the clinic with any problems, questions or concerns.  Medical decision making: ***  Time spent on visit: I spent {CHL ONC TIME VISIT - DTPQU:8845999869} counseling the patient face to face. The total time spent in the appointment was {CHL ONC TIME VISIT - DTPQU:[id number]} and more than 50% was on counseling.  Pleasant Debbie Barefoot, PA-C  ***

## 2024-08-06 ENCOUNTER — Inpatient Hospital Stay: Payer: Self-pay | Admitting: Physician Assistant

## 2024-08-06 ENCOUNTER — Inpatient Hospital Stay: Payer: Self-pay | Attending: Physician Assistant

## 2024-08-06 VITALS — BP 133/70 | HR 75 | Temp 97.7°F | Resp 18 | Ht 66.0 in | Wt 182.0 lb

## 2024-08-06 DIAGNOSIS — C8338 Diffuse large B-cell lymphoma, lymph nodes of multiple sites: Secondary | ICD-10-CM

## 2024-08-06 DIAGNOSIS — C8330 Diffuse large B-cell lymphoma, unspecified site: Secondary | ICD-10-CM | POA: Diagnosis present

## 2024-08-06 DIAGNOSIS — Z8051 Family history of malignant neoplasm of kidney: Secondary | ICD-10-CM | POA: Diagnosis not present

## 2024-08-06 DIAGNOSIS — K7400 Hepatic fibrosis, unspecified: Secondary | ICD-10-CM | POA: Diagnosis not present

## 2024-08-06 DIAGNOSIS — K7581 Nonalcoholic steatohepatitis (NASH): Secondary | ICD-10-CM | POA: Insufficient documentation

## 2024-08-06 DIAGNOSIS — Z803 Family history of malignant neoplasm of breast: Secondary | ICD-10-CM | POA: Insufficient documentation

## 2024-08-06 DIAGNOSIS — R7401 Elevation of levels of liver transaminase levels: Secondary | ICD-10-CM | POA: Insufficient documentation

## 2024-08-06 DIAGNOSIS — C8590 Non-Hodgkin lymphoma, unspecified, unspecified site: Secondary | ICD-10-CM

## 2024-08-06 DIAGNOSIS — E119 Type 2 diabetes mellitus without complications: Secondary | ICD-10-CM | POA: Diagnosis not present

## 2024-08-06 DIAGNOSIS — G629 Polyneuropathy, unspecified: Secondary | ICD-10-CM | POA: Diagnosis not present

## 2024-08-06 DIAGNOSIS — Z800 Family history of malignant neoplasm of digestive organs: Secondary | ICD-10-CM | POA: Diagnosis not present

## 2024-08-06 DIAGNOSIS — R5383 Other fatigue: Secondary | ICD-10-CM | POA: Diagnosis not present

## 2024-08-06 DIAGNOSIS — R160 Hepatomegaly, not elsewhere classified: Secondary | ICD-10-CM | POA: Insufficient documentation

## 2024-08-06 LAB — CBC WITH DIFFERENTIAL/PLATELET
Abs Immature Granulocytes: 0.08 K/uL — ABNORMAL HIGH (ref 0.00–0.07)
Basophils Absolute: 0.1 K/uL (ref 0.0–0.1)
Basophils Relative: 1 %
Eosinophils Absolute: 0.2 K/uL (ref 0.0–0.5)
Eosinophils Relative: 3 %
HCT: 39.1 % (ref 36.0–46.0)
Hemoglobin: 13.1 g/dL (ref 12.0–15.0)
Immature Granulocytes: 1 %
Lymphocytes Relative: 11 %
Lymphs Abs: 0.6 K/uL — ABNORMAL LOW (ref 0.7–4.0)
MCH: 31.7 pg (ref 26.0–34.0)
MCHC: 33.5 g/dL (ref 30.0–36.0)
MCV: 94.7 fL (ref 80.0–100.0)
Monocytes Absolute: 0.4 K/uL (ref 0.1–1.0)
Monocytes Relative: 6 %
Neutro Abs: 4.6 K/uL (ref 1.7–7.7)
Neutrophils Relative %: 78 %
Platelets: 145 K/uL — ABNORMAL LOW (ref 150–400)
RBC: 4.13 MIL/uL (ref 3.87–5.11)
RDW: 13.9 % (ref 11.5–15.5)
WBC: 6 K/uL (ref 4.0–10.5)
nRBC: 0 % (ref 0.0–0.2)

## 2024-08-06 LAB — COMPREHENSIVE METABOLIC PANEL WITH GFR
ALT: 61 U/L — ABNORMAL HIGH (ref 0–44)
AST: 100 U/L — ABNORMAL HIGH (ref 15–41)
Albumin: 4.3 g/dL (ref 3.5–5.0)
Alkaline Phosphatase: 159 U/L — ABNORMAL HIGH (ref 38–126)
Anion gap: 14 (ref 5–15)
BUN: 21 mg/dL (ref 8–23)
CO2: 24 mmol/L (ref 22–32)
Calcium: 9 mg/dL (ref 8.9–10.3)
Chloride: 103 mmol/L (ref 98–111)
Creatinine, Ser: 0.97 mg/dL (ref 0.44–1.00)
GFR, Estimated: >60 mL/min (ref >60)
Glucose, Bld: 127 mg/dL — ABNORMAL HIGH (ref 70–99)
Potassium: 4.2 mmol/L (ref 3.5–5.1)
Sodium: 140 mmol/L (ref 135–145)
Total Bilirubin: 0.9 mg/dL (ref 0.0–1.2)
Total Protein: 6.2 g/dL — ABNORMAL LOW (ref 6.5–8.1)

## 2024-08-06 LAB — LACTATE DEHYDROGENASE: LDH: 211 U/L (ref 105–235)

## 2024-08-06 NOTE — Patient Instructions (Signed)
 [facility] [place] - A DEPT OF [facility]  Discharge Instructions: Thank you for choosing [facility] to provide your oncology and hematology care.  If you have a lab appointment with the Cancer Center - please note that after April 8th, 2024, all labs will be drawn in the cancer center.  You do not have to check in or register with the main entrance as you have in the past but will complete your check-in in the cancer center.  Wear comfortable clothing and clothing appropriate for easy access to any Portacath or PICC line.   We strive to give you quality time with your provider. You may need to reschedule your appointment if you arrive late (15 or more minutes).  Arriving late affects you and other patients whose appointments are after yours.  Also, if you miss three or more appointments without notifying the office, you may be dismissed from the clinic at the provider's discretion.      For prescription refill requests, have your pharmacy contact our office and allow 72 hours for refills to be completed.    Today you received the following port flush with lab draw, return as scheduled.   To help prevent nausea and vomiting after your treatment, we encourage you to take your nausea medication as directed.  BELOW ARE SYMPTOMS THAT SHOULD BE REPORTED IMMEDIATELY: *FEVER GREATER THAN 100.4 F (38 C) OR HIGHER *CHILLS OR SWEATING *NAUSEA AND VOMITING THAT IS NOT CONTROLLED WITH YOUR NAUSEA MEDICATION *UNUSUAL SHORTNESS OF BREATH *UNUSUAL BRUISING OR BLEEDING *URINARY PROBLEMS (pain or burning when urinating, or frequent urination) *BOWEL PROBLEMS (unusual diarrhea, constipation, pain near the anus) TENDERNESS IN MOUTH AND THROAT WITH OR WITHOUT PRESENCE OF ULCERS (sore throat, sores in mouth, or a toothache) UNUSUAL RASH, SWELLING OR PAIN  UNUSUAL VAGINAL DISCHARGE OR ITCHING   Items with * indicate a potential emergency and should be followed up as  soon as possible or go to the Emergency Department if any problems should occur.  Please show the CHEMOTHERAPY ALERT CARD or IMMUNOTHERAPY ALERT CARD at check-in to the Emergency Department and triage nurse.  Should you have questions after your visit or need to cancel or reschedule your appointment, please contact [facility] [place] - A DEPT OF [facility] [phone]  and follow the prompts.  Office hours are 8:00 a.m. to 4:30 p.m. Monday - Friday. Please note that voicemails left after 4:00 p.m. may not be returned until the following business day.  We are closed weekends and major holidays. You have access to a nurse at all times for urgent questions. Please call the main number to the clinic [phone] and follow the prompts.  For any non-urgent questions, you may also contact your provider using MyChart. We now offer e-Visits for anyone 7 and older to request care online for non-urgent symptoms. For details visit [email or web address].   Also download the MyChart app! Go to the app store, search "MyChart", open the app, select [place], and log in with your MyChart username and password.

## 2024-08-06 NOTE — Patient Instructions (Signed)
 [facility] at [facility] **VISIT SUMMARY & IMPORTANT INSTRUCTIONS **   You were seen today by Pleasant Barefoot PA-C for your follow-up visit.    HISTORY OF LYMPHOMA You do not have any evidence of recurrent lymphoma at this time. We will check CT scan in February 2026. We will see you for labs and office visit in 6 months.  OTHER CONCERNS ELEVATED LIVER ENZYMES: Previous testing showed enlarged liver, liver inflammation, and fibrosis (scarring).  We will refer you to see local gastroenterologist (Dr. Cinderella) for further workup and treatment of liver disease. 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  We can prescribe medication to help with this if it becomes bothersome.  Otherwise, recommend that you discuss with your primary care provider regarding other causes of neuropathy such as diabetes. LUNG ISSUES: Continue follow-up with Dr. 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  FOLLOW-UP APPOINTMENT: 6 months  ** Thank you for trusting me with your healthcare!  I strive to provide all of my patients with quality care at each visit.  If you receive a survey for this visit, I would be so grateful to you for taking the time to provide feedback.  Thank you in advance!  ~ Valbona Slabach                                        Dr. Mickiel Davonna Pleasant Barefoot, PA-C          Delon Hope, NP   - - - - - - - - - - - - - - - - - -    Thank you for choosing [facility] at [facility] to provide your oncology and hematology care.  To afford each patient quality time with our provider, please arrive at least 15 minutes before your scheduled appointment time.   If you have a lab appointment with the Cancer Center please come in thru the Main Entrance and check in at the main information desk.  You need to re-schedule your appointment should you arrive 10  or more minutes late.  We strive to give you quality time with our providers, and arriving late affects you and other patients whose appointments are after yours.  Also, if you no show three or more times for appointments you may be dismissed from the clinic at the providers discretion.     Again, thank you for choosing [facility].  Our hope is that these requests will decrease the amount of time that you wait before being seen by our physicians.       _____________________________________________________________  Should you have questions after your visit to [facility], please contact our office at [phone] and follow the prompts.  Our office hours are 8:00 a.m. and 4:30 p.m. Monday - Friday.  Please note that voicemails left after 4:00 p.m. may not be returned until the following business day.  We are closed weekends and major holidays.  You do have access to a nurse 24-7, just call the main number to the clinic [phone] and do not press any options, hold on the line and a nurse will answer the phone.    For prescription refill requests, have your pharmacy contact our office and allow 72 hours.

## 2024-08-06 NOTE — Progress Notes (Signed)
 Port flushed with good blood return noted. No bruising or swelling at site. Bandaid applied and patient discharged in satisfactory condition. VVS stable with no signs or symptoms of distressed noted.

## 2024-08-07 NOTE — Telephone Encounter (Signed)
 Attempted to call patient again, no answer so I left voicemail. I will also send a MyChart message.

## 2024-08-15 ENCOUNTER — Encounter (INDEPENDENT_AMBULATORY_CARE_PROVIDER_SITE_OTHER): Payer: Self-pay | Admitting: *Deleted

## 2024-08-20 ENCOUNTER — Encounter: Payer: Self-pay | Admitting: Physician Assistant

## 2024-08-21 ENCOUNTER — Telehealth: Payer: Self-pay | Admitting: *Deleted

## 2024-08-21 NOTE — Telephone Encounter (Signed)
 She called to advise that she has developed nocturnal SOB and difficulty catching her breath since last visit. No other symptoms noted. PCP did CXR and she was given an antibiotic, which she has completed.  Per Pleasant Barefoot, PAC recommended that she reach back out to PCP or her cardiologist, as this is concerning for possible fluid overload.  Patient verbalized understanding.

## 2024-09-23 ENCOUNTER — Encounter: Payer: Self-pay | Admitting: *Deleted

## 2024-11-06 ENCOUNTER — Ambulatory Visit

## 2024-11-06 ENCOUNTER — Inpatient Hospital Stay

## 2024-12-17 ENCOUNTER — Inpatient Hospital Stay

## 2024-12-17 ENCOUNTER — Other Ambulatory Visit (HOSPITAL_COMMUNITY)

## 2025-01-28 ENCOUNTER — Inpatient Hospital Stay

## 2025-02-04 ENCOUNTER — Inpatient Hospital Stay: Admitting: Physician Assistant
